# Patient Record
Sex: Female | Born: 1950
Health system: Southern US, Community
[De-identification: ages and names within clinical notes are randomized; demographics above are authoritative.]

## PROBLEM LIST (undated history)

## (undated) DIAGNOSIS — I10 Essential (primary) hypertension: Secondary | ICD-10-CM

## (undated) DIAGNOSIS — E669 Obesity, unspecified: Secondary | ICD-10-CM

## (undated) DIAGNOSIS — E785 Hyperlipidemia, unspecified: Secondary | ICD-10-CM

## (undated) DIAGNOSIS — C44311 Basal cell carcinoma of skin of nose: Secondary | ICD-10-CM

## (undated) HISTORY — PX: TOTAL ABDOMINAL HYSTERECTOMY W/ BILATERAL SALPINGOOPHORECTOMY: SHX83

## (undated) HISTORY — DX: Essential (primary) hypertension: I10

## (undated) HISTORY — PX: ABDOMINAL HYSTERECTOMY: SHX81

## (undated) HISTORY — DX: Hyperlipidemia, unspecified: E78.5

## (undated) HISTORY — PX: ORIF TIBIA FRACTURE: SHX5416

## (undated) HISTORY — DX: Basal cell carcinoma of skin of nose: C44.311

## (undated) HISTORY — DX: Obesity, unspecified: E66.9

---
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
## 2000-09-02 ENCOUNTER — Other Ambulatory Visit: Admission: RE | Admit: 2000-09-02 | Discharge: 2000-09-02 | Payer: Self-pay | Admitting: Obstetrics and Gynecology

## 2001-11-14 ENCOUNTER — Other Ambulatory Visit: Admission: RE | Admit: 2001-11-14 | Discharge: 2001-11-14 | Payer: Self-pay | Admitting: Obstetrics and Gynecology

## 2003-02-22 ENCOUNTER — Ambulatory Visit (HOSPITAL_COMMUNITY): Admission: RE | Admit: 2003-02-22 | Discharge: 2003-02-22 | Payer: Self-pay | Admitting: Gastroenterology

## 2003-02-22 ENCOUNTER — Encounter (INDEPENDENT_AMBULATORY_CARE_PROVIDER_SITE_OTHER): Payer: Self-pay | Admitting: *Deleted

## 2003-04-06 ENCOUNTER — Encounter: Payer: Self-pay | Admitting: Family Medicine

## 2003-04-06 ENCOUNTER — Encounter: Admission: RE | Admit: 2003-04-06 | Discharge: 2003-04-06 | Payer: Self-pay | Admitting: Family Medicine

## 2006-04-04 ENCOUNTER — Emergency Department (HOSPITAL_COMMUNITY): Admission: EM | Admit: 2006-04-04 | Discharge: 2006-04-04 | Payer: Self-pay | Admitting: Emergency Medicine

## 2006-06-12 ENCOUNTER — Ambulatory Visit: Payer: Self-pay | Admitting: Family Medicine

## 2006-07-02 ENCOUNTER — Ambulatory Visit: Payer: Self-pay | Admitting: Family Medicine

## 2006-10-03 ENCOUNTER — Ambulatory Visit: Payer: Self-pay | Admitting: Family Medicine

## 2007-02-03 ENCOUNTER — Ambulatory Visit: Payer: Self-pay | Admitting: Family Medicine

## 2007-06-25 ENCOUNTER — Ambulatory Visit: Payer: Self-pay | Admitting: Family Medicine

## 2007-10-27 ENCOUNTER — Ambulatory Visit: Payer: Self-pay | Admitting: Family Medicine

## 2008-03-10 ENCOUNTER — Ambulatory Visit: Payer: Self-pay | Admitting: Family Medicine

## 2008-05-17 ENCOUNTER — Ambulatory Visit: Payer: Self-pay | Admitting: Family Medicine

## 2008-07-05 ENCOUNTER — Ambulatory Visit: Payer: Self-pay | Admitting: Family Medicine

## 2008-10-25 ENCOUNTER — Ambulatory Visit: Payer: Self-pay | Admitting: Family Medicine

## 2009-02-22 ENCOUNTER — Ambulatory Visit: Payer: Self-pay | Admitting: Family Medicine

## 2009-06-30 ENCOUNTER — Ambulatory Visit: Payer: Self-pay | Admitting: Family Medicine

## 2009-10-28 ENCOUNTER — Ambulatory Visit: Payer: Self-pay | Admitting: Family Medicine

## 2009-12-05 ENCOUNTER — Ambulatory Visit: Payer: Self-pay | Admitting: Family Medicine

## 2010-01-09 ENCOUNTER — Ambulatory Visit: Payer: Self-pay | Admitting: Family Medicine

## 2010-02-24 ENCOUNTER — Ambulatory Visit: Payer: Self-pay | Admitting: Family Medicine

## 2010-06-28 ENCOUNTER — Ambulatory Visit: Payer: Self-pay | Admitting: Family Medicine

## 2010-10-26 ENCOUNTER — Ambulatory Visit
Admission: RE | Admit: 2010-10-26 | Discharge: 2010-10-26 | Payer: Self-pay | Source: Home / Self Care | Attending: Family Medicine | Admitting: Family Medicine

## 2011-02-20 HISTORY — PX: OTHER SURGICAL HISTORY: SHX169

## 2011-02-22 ENCOUNTER — Ambulatory Visit (INDEPENDENT_AMBULATORY_CARE_PROVIDER_SITE_OTHER): Payer: 59 | Admitting: Family Medicine

## 2011-02-22 DIAGNOSIS — I10 Essential (primary) hypertension: Secondary | ICD-10-CM

## 2011-02-22 DIAGNOSIS — E119 Type 2 diabetes mellitus without complications: Secondary | ICD-10-CM

## 2011-02-22 DIAGNOSIS — E785 Hyperlipidemia, unspecified: Secondary | ICD-10-CM

## 2011-02-22 DIAGNOSIS — Z79899 Other long term (current) drug therapy: Secondary | ICD-10-CM

## 2011-03-09 NOTE — Op Note (Signed)
NAMEJONE, PANEBIANCO                           ACCOUNT NO.:  [id number]   MEDICAL RECORD NO.:  [id number]                   PATIENT TYPE:  AMB   LOCATION:  ENDO                                 FACILITY:  [facility]   PHYSICIAN:  Petra Kuba, M.D.                 DATE OF BIRTH:  30-Aug-1951   DATE OF PROCEDURE:  02/22/2003  DATE OF DISCHARGE:                                 OPERATIVE REPORT   PROCEDURE PERFORMED:  Colonoscopy.   ENDOSCOPIST:  Petra Kuba, M.D.   INDICATIONS FOR PROCEDURE:  Screening.  Consent was signed after the risks,  benefits, methods and options were thoroughly discussed in the office.   MEDICINES USED:  Demerol 60 mg, Versed 6 mg.   DESCRIPTION OF PROCEDURE:  Rectal inspection was pertinent for external  hemorrhoids.  Digital exam was negative.  A video colonoscope was inserted  and easily advanced to the mid transverse.  At that point there was some  looping.  With abdominal pressure we were able to advance to the cecum.  To  get a better look at the cecum, we went ahead and rolled her on her back.  The cecum was pertinent for the appendiceal orifice and the ileocecal valve.  There were three small polyps which were hot biopsies and two tiny polyps  which were cold biopsied.  These were also put in the first container.  We  did use a lower setting for the cautery.  There was also a small nodule in  the cecum, probably nothing and two cold biopsies were obtained and put in  the second container.  The scope was slowly withdrawn.  The prep was  adequate.  There was some liquid stool that required washing and suctioning.  On slow withdrawal through the colon, the ascending and transverse was  normal.  The proximal level of the splenic flexure, a sessile polyp was seen  and was hot biopsied times three on the higher setting and put in the third  container.  The scope was further withdrawn.  There was a rare distal left-  sided diverticula but no other  abnormalities.  Once back in the rectum, the  scope was retroflexed, pertinent for some internal hemorrhoids.  Scope was  straightened, air was suctioned, scope removed.  The patient tolerated the  procedure well.  There was no immediate obvious complication.   ENDOSCOPIC DIAGNOSIS:  1. Internal and external hemorrhoids.  2. Rare sigmoid diverticula.  3. Splenic flexure with a questionable sessile polyp hot biopsied times     three.  4. Tiny small cecal polyps hot biopsied and two cold biopsies, put in the     first container.  5. Questionable cecal nodule, doubt significant.  Cold biopsy put in the     second container.  6. Otherwise within normal limits to the cecum.    PLAN:  Await pathology to determine future  colonic screening.  Happy to see  back p.r.n.  Otherwise return care to see Dr. Andrey Campanile and Dr. Ambrose Mantle for  customary health care maintenance to include yearly rectals and guaiacs.                                               Petra Kuba, M.D.    MEM/MEDQ  D:  02/22/2003  T:  02/22/2003  Job:  [id number]   cc:   Vale Haven. Andrey Campanile, M.D.  [address]  [place]  [place] [address]  Fax: [phone]   Malachi Pro. Ambrose Mantle, M.D.  [address]  [place]  [place] [address]  Fax: [phone]

## 2011-03-17 ENCOUNTER — Emergency Department (INDEPENDENT_AMBULATORY_CARE_PROVIDER_SITE_OTHER): Payer: Worker's Compensation

## 2011-03-17 ENCOUNTER — Emergency Department (HOSPITAL_BASED_OUTPATIENT_CLINIC_OR_DEPARTMENT_OTHER)
Admission: EM | Admit: 2011-03-17 | Discharge: 2011-03-17 | Disposition: A | Discharge status: Home / Self Care | Payer: Worker's Compensation | Attending: Emergency Medicine | Admitting: Emergency Medicine

## 2011-03-17 DIAGNOSIS — S82009A Unspecified fracture of unspecified patella, initial encounter for closed fracture: Secondary | ICD-10-CM | POA: Insufficient documentation

## 2011-03-17 DIAGNOSIS — M79609 Pain in unspecified limb: Secondary | ICD-10-CM

## 2011-03-17 DIAGNOSIS — E781 Pure hyperglyceridemia: Secondary | ICD-10-CM | POA: Insufficient documentation

## 2011-03-17 DIAGNOSIS — W010XXA Fall on same level from slipping, tripping and stumbling without subsequent striking against object, initial encounter: Secondary | ICD-10-CM | POA: Insufficient documentation

## 2011-03-17 DIAGNOSIS — E119 Type 2 diabetes mellitus without complications: Secondary | ICD-10-CM | POA: Insufficient documentation

## 2011-03-17 DIAGNOSIS — I10 Essential (primary) hypertension: Secondary | ICD-10-CM | POA: Insufficient documentation

## 2011-03-20 ENCOUNTER — Telehealth: Payer: Self-pay | Admitting: Family Medicine

## 2011-03-20 NOTE — Telephone Encounter (Signed)
Pt fell at work Saturday 03/17/11 and broke her knee cap.  She is being referred by ER to Dr. Durene Romans of [facility].  Pt just wanted you to be aware.

## 2011-03-22 ENCOUNTER — Other Ambulatory Visit: Payer: Self-pay | Admitting: Specialist

## 2011-03-22 ENCOUNTER — Encounter (HOSPITAL_COMMUNITY): Payer: Worker's Compensation

## 2011-03-22 ENCOUNTER — Other Ambulatory Visit (HOSPITAL_COMMUNITY): Payer: Self-pay | Admitting: Specialist

## 2011-03-22 ENCOUNTER — Telehealth: Payer: Self-pay | Admitting: Family Medicine

## 2011-03-22 ENCOUNTER — Ambulatory Visit (HOSPITAL_COMMUNITY)
Admission: RE | Admit: 2011-03-22 | Discharge: 2011-03-22 | Disposition: A | Discharge status: Home / Self Care | Payer: Worker's Compensation | Source: Ambulatory Visit | Attending: Specialist | Admitting: Specialist

## 2011-03-22 DIAGNOSIS — Z01811 Encounter for preprocedural respiratory examination: Secondary | ICD-10-CM

## 2011-03-22 DIAGNOSIS — I10 Essential (primary) hypertension: Secondary | ICD-10-CM

## 2011-03-22 DIAGNOSIS — I498 Other specified cardiac arrhythmias: Secondary | ICD-10-CM | POA: Insufficient documentation

## 2011-03-22 DIAGNOSIS — Z01810 Encounter for preprocedural cardiovascular examination: Secondary | ICD-10-CM | POA: Insufficient documentation

## 2011-03-22 DIAGNOSIS — Z01818 Encounter for other preprocedural examination: Secondary | ICD-10-CM | POA: Insufficient documentation

## 2011-03-22 DIAGNOSIS — Z01812 Encounter for preprocedural laboratory examination: Secondary | ICD-10-CM | POA: Insufficient documentation

## 2011-03-22 LAB — CROSSMATCH: Antibody Screen: NEGATIVE

## 2011-03-22 LAB — APTT: aPTT: 28 seconds (ref 24–37)

## 2011-03-22 LAB — CBC
HCT: 35.7 % — ABNORMAL LOW (ref 36.0–46.0)
MCV: 93.9 fL (ref 78.0–100.0)
RDW: 13.1 % (ref 11.5–15.5)
WBC: 4.7 10*3/uL (ref 4.0–10.5)

## 2011-03-22 LAB — COMPREHENSIVE METABOLIC PANEL
Albumin: 3.5 g/dL (ref 3.5–5.2)
BUN: 20 mg/dL (ref 6–23)
Creatinine, Ser: 0.62 mg/dL (ref 0.4–1.2)
GFR calc Af Amer: >60 mL/min (ref >60)
Potassium: 4.2 mEq/L (ref 3.5–5.1)
Total Protein: 6.5 g/dL (ref 6.0–8.3)

## 2011-03-22 LAB — DIFFERENTIAL
Eosinophils Relative: 3 % (ref 0–5)
Lymphocytes Relative: 19 % (ref 12–46)
Lymphs Abs: 0.9 10*3/uL (ref 0.7–4.0)
Monocytes Absolute: 0.3 10*3/uL (ref 0.1–1.0)
Monocytes Relative: 7 % (ref 3–12)

## 2011-03-22 LAB — URINALYSIS, ROUTINE W REFLEX MICROSCOPIC
Bilirubin Urine: NEGATIVE
Glucose, UA: NEGATIVE mg/dL
Hgb urine dipstick: NEGATIVE
Ketones, ur: NEGATIVE mg/dL
pH: 5.5 (ref 5.0–8.0)

## 2011-03-22 LAB — PROTIME-INR: INR: 1.01 (ref 0.00–1.49)

## 2011-03-22 LAB — SURGICAL PCR SCREEN: MRSA, PCR: NEGATIVE

## 2011-03-23 ENCOUNTER — Ambulatory Visit (HOSPITAL_COMMUNITY)
Admission: RE | Admit: 2011-03-23 | Discharge: 2011-03-25 | Disposition: A | Discharge status: Home / Self Care | Payer: Worker's Compensation | Source: Ambulatory Visit | Attending: Specialist | Admitting: Specialist

## 2011-03-23 DIAGNOSIS — Z79899 Other long term (current) drug therapy: Secondary | ICD-10-CM | POA: Insufficient documentation

## 2011-03-23 DIAGNOSIS — I10 Essential (primary) hypertension: Secondary | ICD-10-CM | POA: Insufficient documentation

## 2011-03-23 DIAGNOSIS — Z7982 Long term (current) use of aspirin: Secondary | ICD-10-CM | POA: Insufficient documentation

## 2011-03-23 DIAGNOSIS — J45909 Unspecified asthma, uncomplicated: Secondary | ICD-10-CM | POA: Insufficient documentation

## 2011-03-23 DIAGNOSIS — X58XXXA Exposure to other specified factors, initial encounter: Secondary | ICD-10-CM | POA: Insufficient documentation

## 2011-03-23 DIAGNOSIS — S82009A Unspecified fracture of unspecified patella, initial encounter for closed fracture: Secondary | ICD-10-CM | POA: Insufficient documentation

## 2011-03-23 DIAGNOSIS — E119 Type 2 diabetes mellitus without complications: Secondary | ICD-10-CM | POA: Insufficient documentation

## 2011-03-23 LAB — GLUCOSE, CAPILLARY: Glucose-Capillary: 115 mg/dL — ABNORMAL HIGH (ref 70–99)

## 2011-03-24 LAB — GLUCOSE, CAPILLARY: Glucose-Capillary: 181 mg/dL — ABNORMAL HIGH (ref 70–99)

## 2011-03-25 LAB — GLUCOSE, CAPILLARY
Glucose-Capillary: 123 mg/dL — ABNORMAL HIGH (ref 70–99)
Glucose-Capillary: 173 mg/dL — ABNORMAL HIGH (ref 70–99)
Glucose-Capillary: 149 mg/dL — ABNORMAL HIGH (ref 70–99)

## 2011-03-30 ENCOUNTER — Emergency Department (HOSPITAL_COMMUNITY): Payer: 59

## 2011-03-30 ENCOUNTER — Emergency Department (HOSPITAL_COMMUNITY)
Admission: EM | Admit: 2011-03-30 | Discharge: 2011-03-30 | Disposition: A | Discharge status: Home / Self Care | Payer: 59 | Attending: Emergency Medicine | Admitting: Emergency Medicine

## 2011-03-30 DIAGNOSIS — I10 Essential (primary) hypertension: Secondary | ICD-10-CM | POA: Insufficient documentation

## 2011-03-30 DIAGNOSIS — R109 Unspecified abdominal pain: Secondary | ICD-10-CM | POA: Insufficient documentation

## 2011-03-30 DIAGNOSIS — Z9889 Other specified postprocedural states: Secondary | ICD-10-CM | POA: Insufficient documentation

## 2011-03-30 DIAGNOSIS — E119 Type 2 diabetes mellitus without complications: Secondary | ICD-10-CM | POA: Insufficient documentation

## 2011-03-30 DIAGNOSIS — K5900 Constipation, unspecified: Secondary | ICD-10-CM | POA: Insufficient documentation

## 2011-03-30 DIAGNOSIS — R112 Nausea with vomiting, unspecified: Secondary | ICD-10-CM | POA: Insufficient documentation

## 2011-03-30 LAB — CBC
Hemoglobin: 11.7 g/dL — ABNORMAL LOW (ref 12.0–15.0)
MCH: 30.2 pg (ref 26.0–34.0)
MCHC: 33.3 g/dL (ref 30.0–36.0)
Platelets: 272 10*3/uL (ref 150–400)
RDW: 12.8 % (ref 11.5–15.5)

## 2011-03-30 LAB — COMPREHENSIVE METABOLIC PANEL
AST: 15 U/L (ref 0–37)
CO2: 28 mEq/L (ref 19–32)
Calcium: 10.4 mg/dL (ref 8.4–10.5)
Creatinine, Ser: 0.61 mg/dL (ref 0.4–1.2)
GFR calc Af Amer: >60 mL/min (ref >60)
GFR calc non Af Amer: >60 mL/min (ref >60)
Total Protein: 6.9 g/dL (ref 6.0–8.3)

## 2011-03-30 LAB — URINALYSIS, ROUTINE W REFLEX MICROSCOPIC
Hgb urine dipstick: NEGATIVE
Nitrite: NEGATIVE
Specific Gravity, Urine: 1.018 (ref 1.005–1.030)
Urobilinogen, UA: 0.2 mg/dL (ref 0.0–1.0)
pH: 7 (ref 5.0–8.0)

## 2011-03-30 LAB — DIFFERENTIAL
Basophils Relative: 0 % (ref 0–1)
Eosinophils Absolute: 0 10*3/uL (ref 0.0–0.7)
Eosinophils Relative: 0 % (ref 0–5)
Monocytes Absolute: 0.4 10*3/uL (ref 0.1–1.0)
Monocytes Relative: 5 % (ref 3–12)

## 2011-03-30 LAB — URINE MICROSCOPIC-ADD ON

## 2011-03-31 LAB — URINE CULTURE: Colony Count: 6000

## 2011-04-03 NOTE — Telephone Encounter (Signed)
OK 

## 2011-04-07 ENCOUNTER — Emergency Department (HOSPITAL_COMMUNITY): Payer: Worker's Compensation

## 2011-04-07 ENCOUNTER — Inpatient Hospital Stay (INDEPENDENT_AMBULATORY_CARE_PROVIDER_SITE_OTHER)
Admission: RE | Admit: 2011-04-07 | Discharge: 2011-04-07 | Disposition: A | Discharge status: Home / Self Care | Payer: 59 | Source: Ambulatory Visit | Attending: Family Medicine | Admitting: Family Medicine

## 2011-04-07 ENCOUNTER — Inpatient Hospital Stay (HOSPITAL_COMMUNITY)
Admission: EM | Admit: 2011-04-07 | Discharge: 2011-04-11 | DRG: 390 | Disposition: A | Discharge status: Home / Self Care | Payer: Worker's Compensation | Attending: Surgery | Admitting: Surgery

## 2011-04-07 DIAGNOSIS — T4275XA Adverse effect of unspecified antiepileptic and sedative-hypnotic drugs, initial encounter: Secondary | ICD-10-CM | POA: Diagnosis present

## 2011-04-07 DIAGNOSIS — R109 Unspecified abdominal pain: Secondary | ICD-10-CM

## 2011-04-07 DIAGNOSIS — Y92009 Unspecified place in unspecified non-institutional (private) residence as the place of occurrence of the external cause: Secondary | ICD-10-CM

## 2011-04-07 DIAGNOSIS — K56609 Unspecified intestinal obstruction, unspecified as to partial versus complete obstruction: Secondary | ICD-10-CM

## 2011-04-07 DIAGNOSIS — E119 Type 2 diabetes mellitus without complications: Secondary | ICD-10-CM | POA: Diagnosis present

## 2011-04-07 DIAGNOSIS — K560 Paralytic ileus: Principal | ICD-10-CM | POA: Diagnosis present

## 2011-04-07 DIAGNOSIS — I10 Essential (primary) hypertension: Secondary | ICD-10-CM | POA: Diagnosis present

## 2011-04-07 DIAGNOSIS — K5900 Constipation, unspecified: Secondary | ICD-10-CM | POA: Diagnosis present

## 2011-04-08 LAB — DIFFERENTIAL
Lymphocytes Relative: 14 % (ref 12–46)
Monocytes Absolute: 0.7 10*3/uL (ref 0.1–1.0)
Monocytes Relative: 10 % (ref 3–12)
Neutro Abs: 5.2 10*3/uL (ref 1.7–7.7)

## 2011-04-08 LAB — CBC
HCT: 37.4 % (ref 36.0–46.0)
Hemoglobin: 12.9 g/dL (ref 12.0–15.0)
MCH: 31 pg (ref 26.0–34.0)
MCHC: 34.5 g/dL (ref 30.0–36.0)

## 2011-04-08 LAB — GLUCOSE, CAPILLARY
Glucose-Capillary: 128 mg/dL — ABNORMAL HIGH (ref 70–99)
Glucose-Capillary: 158 mg/dL — ABNORMAL HIGH (ref 70–99)
Glucose-Capillary: 167 mg/dL — ABNORMAL HIGH (ref 70–99)

## 2011-04-08 LAB — BASIC METABOLIC PANEL
BUN: 19 mg/dL (ref 6–23)
Calcium: 9.8 mg/dL (ref 8.4–10.5)
GFR calc non Af Amer: >60 mL/min (ref >60)
Glucose, Bld: 143 mg/dL — ABNORMAL HIGH (ref 70–99)

## 2011-04-08 LAB — URINALYSIS, ROUTINE W REFLEX MICROSCOPIC
Bilirubin Urine: NEGATIVE
Hgb urine dipstick: NEGATIVE
Protein, ur: NEGATIVE mg/dL
Specific Gravity, Urine: 1.029 (ref 1.005–1.030)
Urobilinogen, UA: 1 mg/dL (ref 0.0–1.0)

## 2011-04-08 LAB — URINE MICROSCOPIC-ADD ON

## 2011-04-08 MED ORDER — IOHEXOL 300 MG/ML  SOLN
80.0000 mL | Freq: Once | INTRAMUSCULAR | Status: AC | PRN
  Administered 2011-04-08: 80 mL via INTRAVENOUS

## 2011-04-09 ENCOUNTER — Inpatient Hospital Stay (HOSPITAL_COMMUNITY): Payer: Worker's Compensation

## 2011-04-09 LAB — PHOSPHORUS: Phosphorus: 3.3 mg/dL (ref 2.3–4.6)

## 2011-04-09 LAB — GLUCOSE, CAPILLARY
Glucose-Capillary: 133 mg/dL — ABNORMAL HIGH (ref 70–99)
Glucose-Capillary: 149 mg/dL — ABNORMAL HIGH (ref 70–99)
Glucose-Capillary: 150 mg/dL — ABNORMAL HIGH (ref 70–99)

## 2011-04-09 LAB — BASIC METABOLIC PANEL
BUN: 9 mg/dL (ref 6–23)
CO2: 30 mEq/L (ref 19–32)
Calcium: 9.3 mg/dL (ref 8.4–10.5)
Creatinine, Ser: 0.51 mg/dL (ref 0.50–1.10)
Glucose, Bld: 119 mg/dL — ABNORMAL HIGH (ref 70–99)

## 2011-04-10 LAB — URINE CULTURE: Culture  Setup Time: 201206171205

## 2011-04-10 LAB — GLUCOSE, CAPILLARY: Glucose-Capillary: 90 mg/dL (ref 70–99)

## 2011-04-11 LAB — GLUCOSE, CAPILLARY
Glucose-Capillary: 102 mg/dL — ABNORMAL HIGH (ref 70–99)
Glucose-Capillary: 104 mg/dL — ABNORMAL HIGH (ref 70–99)

## 2011-04-12 LAB — GLUCOSE, CAPILLARY: Glucose-Capillary: 137 mg/dL — ABNORMAL HIGH (ref 70–99)

## 2011-04-12 NOTE — Discharge Summary (Signed)
[name], [name] NO.:  [id number]  MEDICAL RECORD NO.:  [id number]  LOCATION:  5501                         FACILITY:  [facility]  PHYSICIAN:  Velora Heckler, MD      DATE OF BIRTH:  [birth year]  DATE OF ADMISSION:  04/07/2011 DATE OF DISCHARGE:  04/11/2011                              DISCHARGE SUMMARY   ADMITTING PHYSICIAN:  Abigail Miyamoto, MD  DATE OF DISCHARGE:  April 11, 2011  DISCHARGING PHYSICIAN:  Velora Heckler, MD  ORTHOPEDIC SURGEON:  Erasmo Leventhal, MD  CONSULTANTS:  None.  PROCEDURES:  None.  REASON FOR ADMISSION:  [name] is a 60 year old female who is status post ORIF of a right patellar fracture by Dr. Thomasena Edis on March 23, 2011. Since that surgery, she has had some intermittent crampy abdominal pain. Over the last 24 hours prior to admission, she developed nausea and vomiting along with worsening crampy abdominal pain.  She presented to the emergency department for further evaluation.  Upon arrival, she had a CT scan that revealed dilated small bowel with air-fluid levels, could not rule out a small bowel obstruction.  At this time, we are asked to evaluate the patient for surgical admission.  Please see admitting history and physical for further details.  ADMITTING DIAGNOSES: 1. Suspected ileus given recent surgery but rule out small bowel     obstruction. 2. Diabetes mellitus. 3. Hypertension.  HOSPITAL COURSE:  At this time, the patient was admitted.  An NG tube was placed in the emergency department.  She was initially given bowel rest.  The patient is still better on hospital day #1 after having her NG tube placed.  She was not passing flatus at this time and therefore her NG tube was continued.  She did appear to have some constipation and therefore she was given several enemas.  The patient did have good results with these enemas and the following day she was having more active bowel sounds as well as flatus.  At this  time, her NG tube was clamped and placed on clamping trials.  By hospital day #3, the patient was passing flatus and did not have any nausea or vomiting with clamping of her NG tube.  Therefore her NG tube was discontinued and her diet was advanced as tolerated.  By hospital day #4, the patient was tolerating regular diet and having normal bowel movement.  Her abdomen was soft, nontender, nondistended with active bowel sounds.  At this time, the patient was felt stable for discharge home.  DISCHARGE DIAGNOSES: 1. Likely ileus with constipation secondary to narcotic use. 2. Hypertension. 3. Diabetes mellitus.  DISCHARGE MEDICATIONS:  Please see medication reconciliation form.  The patient may resume all normal home medicines.  She is encouraged to take MiraLax over-the-counter as needed for constipation.  DISCHARGE INSTRUCTIONS:  The patient has no activity restrictions from a General Surgery standpoint, however, she is to maintain her restrictions from an orthopedic standpoint.  She has no dietary restrictions.  She does not need to follow up with [place] except on a p.r.n. basis.  She does, however, need to follow up with Dr. Thomasena Edis as already scheduled.  Letha Cape, PA   ______________________________ Velora Heckler, MD    KEO/MEDQ  D:  04/11/2011  T:  04/11/2011  Job:  [id number]  cc:   [facility] Erasmo Leventhal, M.D.  Electronically Signed by Barnetta Chapel PA on 04/12/2011 01:26:36 PM Electronically Signed by Darnell Level MD on 04/12/2011 03:30:38 PM

## 2011-04-18 NOTE — H&P (Signed)
[name], [name] NO.:  [id number]  MEDICAL RECORD NO.:  [id number]  LOCATION:  [facility]                         FACILITY:  [facility]  PHYSICIAN:  Abigail Miyamoto, M.D. DATE OF BIRTH:  02-13-1951  DATE OF ADMISSION:  04/07/2011 DATE OF DISCHARGE:                             HISTORY & PHYSICAL   CHIEF COMPLAINT:  Abdominal pain, nausea, and vomiting.  HISTORY:  This is a 60 year old female who is status post ORIF of a right patella fracture by Dr. Thomasena Edis at [facility] on March 23, 2011. Postoperatively, she reported she has been having intermittent cramping abdominal pain.  She has not really been constipated since surgery except for last 2 days.  Over the last 24 hours, she developed nausea and vomiting along with worsening of crampy pain.  She denies dysuria. Since presenting to the emergency room, she has a nasogastric tube placed which has made her feel better.  PAST MEDICAL HISTORY: 1. Type 2 diabetes. 2. Hypertension.  PAST SURGICAL HISTORY:  ORIF of the patella, hysterectomy, and bilateral tubal ligation.  FAMILY HISTORY:  Noncontributory.  SOCIAL HISTORY:  She does not smoke and does not drink alcohol.  REVIEW OF SYSTEMS:  GENERAL:  Negative for fever or chills.  PULMONARY: Negative for cough, shortness of breath, or difficulty breathing. CARDIAC:  Negative for chest pain or irregular heartbeat.  ABDOMEN:  As listed above.  There is no hematemesis.  There is no blood in stool. URINARY:  Negative for dysuria or hematuria.  MEDICATIONS:  Please see universal medical reconciliation form.  ALLERGIES:  No known drug allergies.  PHYSICAL EXAMINATION:  GENERAL:  This is a well-developed, well- nourished female in no acute stress. VITAL SIGNS:  She is afebrile.  Vital signs stable. EYES:  Anicteric.  Pupils are reactive bilaterally. ENT:  External ears and nose are normal.  Hearing is normal.  Oropharynx is clear. NECK:  Supple.  Trachea is midline.   There is no thyromegaly. LUNGS:  Clear to auscultation bilaterally with normal respiratory effort. CARDIOVASCULAR:  Regular rate and rhythm.  There are no murmurs.  There is no peripheral edema. ABDOMEN:  Soft, is really nondistended.  There is minimal to no tenderness with no guarding.  There are hypoactive bowel sounds.  There are no hernias. EXTREMITIES:  Warm and well perfused.  No edema, clubbing, or cyanosis. Peripheral ulcers are intact to all 4 extremities.  She has a brace on her right leg from her recent surgery. SKIN:  No rash and no jaundice. NEUROLOGICAL:  She is awake, alert, and oriented. PSYCHIATRIC:  Judgment and affect are normal.  LABORATORY DATA:  White blood count 6.19.  Potassium 5.3, creatinine 0.53, and glucose 143.  The patient has a CAT scan of the abdomen and pelvis that shows dilated small bowel with air-fluid levels, but could not rule out a small bowel obstruction.  IMPRESSION:  This is a patient with a suspected ileus given her recent surgery.  She will be admitted for rule out small bowel obstruction.  PLAN:  Plan will be to admit, continue her nasogastric suctioning and bowel rest and IV rehydration.  We will repeat her abdominal x-rays in 24 hours.  Hopefully, she will improve without need for intervention.     Abigail Miyamoto, M.D.     DB/MEDQ  D:  04/08/2011  T:  04/08/2011  Job:  [id number]  cc:   Dr. Valma Cava  Electronically Signed by Abigail Miyamoto M.D. on 04/18/2011 04:31:06 PM

## 2011-05-09 NOTE — Op Note (Signed)
[name], [name]               ACCOUNT NO.:  [id number]  MEDICAL RECORD NO.:  [id number]           PATIENT TYPE:  O  LOCATION:  DAYL                         FACILITY:  [facility]  PHYSICIAN:  Erasmo Leventhal, M.D.DATE OF BIRTH:  1951-08-05  DATE OF PROCEDURE:  03/23/2011 DATE OF DISCHARGE:                              OPERATIVE REPORT   PREOPERATIVE DIAGNOSIS:  Right knee comminuted patellar fracture.  POSTOPERATIVE DIAGNOSIS:  Right knee comminuted patellar fracture.  PROCEDURES: 1. Open reduction and internal fixation of the comminuted patellar     fracture. 2. C-arm radiography.  SURGEON:  Erasmo Leventhal, M.D.  ASSISTANT:  Jamelle Rushing, P.A.  ANESTHESIA:  Femoral nerve block with general.  ESTIMATED BLOOD LOSS:  Less than 50 mL.  DRAINS:  None.  COMPLICATIONS:  None.  TOURNIQUET TIME:  58 minutes on 300 mmHg.  DISPOSITION:  PACU stable.  OPERATIVE DETAIL:  The patient was counseled in the holding area, correct site was identified.  IV was started.  Correct site was identified.  Taken to the operating room, placed in position.  General anesthesia.  IV antibiotics were given. A time-out was done.  Right lower extremity was elevated.  Prepped with DuraPrep and draped in sterile fashion.  She was exsanguinated with Esmarch and tourniquet was inflated to 300 mmHg.  Straight midline incision was made between skin and subcutaneous tissue.  Soft tissue flaps developed.  Air trauma was encountered.  At this point in time, a subperiosteal dissection was taken down to expose the superior surface of the patella.  There were approximately 5 pieces at this time of a small comminuted fragment. Fracture was opened, the joint was copiously irrigated.  Hematoma was removed.  The fracture reduced as anatomically as possible, held with tenaculum and clamp.  I will then palpate the undersurface of patella. Could feel that the articular surface was nice and  smooth.  Two 0.062 C wires were placed in a retrograde fashion.  C-arm was utilized, revealed excellent reduction of the fracture and placement of the pins.  At this point in time utilizing tension band technique, the wires were placed proximal and distally in a figure-of-eight fashion, tightened down nicely.  C-arm picture confirmed excellent placement of the implants and reduction of fracture.  Also note that tensor had been cut appropriately.  Knee is put through general range of motion 0 to 30 degrees.  Fracture remained stable.  Wounds were copiously irrigated again.  The periosteum of the extensor mechanism was meticulously closed on top of this with a FiberWire suture in a figure-of-eight fashion, retinaculum with Vicryl, subcu with Vicryl, skin with subcu Monocryl suture.  Steri-Strips were applied.  Sterile compressive dressing was applied.  Tourniquet was deflated.  She was placed in a knee immobilizer, custom-molded in full extension, properly padded.  Sponge and needle count correct.  No complications or problems.  She was taken from the operating room to the PACU in stable condition.  To help with surgery technique and decision making, Jamelle Rushing, P.A.'s assistance was needed throughout the entire case.  ______________________________ Erasmo Leventhal, M.D.     RAC/MEDQ  D:  03/23/2011  T:  03/23/2011  Job:  [id number]  Electronically Signed by Eugenia Mcalpine M.D. on 05/09/2011 04:50:03 PM

## 2011-06-21 ENCOUNTER — Encounter: Payer: Self-pay | Admitting: Family Medicine

## 2011-06-27 ENCOUNTER — Ambulatory Visit: Payer: 59 | Admitting: Family Medicine

## 2011-06-27 ENCOUNTER — Encounter: Payer: Self-pay | Admitting: Family Medicine

## 2011-06-27 ENCOUNTER — Ambulatory Visit (INDEPENDENT_AMBULATORY_CARE_PROVIDER_SITE_OTHER): Payer: 59 | Admitting: Family Medicine

## 2011-06-27 DIAGNOSIS — E119 Type 2 diabetes mellitus without complications: Secondary | ICD-10-CM

## 2011-06-27 DIAGNOSIS — I152 Hypertension secondary to endocrine disorders: Secondary | ICD-10-CM

## 2011-06-27 DIAGNOSIS — E1169 Type 2 diabetes mellitus with other specified complication: Secondary | ICD-10-CM

## 2011-06-27 DIAGNOSIS — N390 Urinary tract infection, site not specified: Secondary | ICD-10-CM

## 2011-06-27 DIAGNOSIS — Z23 Encounter for immunization: Secondary | ICD-10-CM

## 2011-06-27 DIAGNOSIS — Z79899 Other long term (current) drug therapy: Secondary | ICD-10-CM

## 2011-06-27 DIAGNOSIS — R208 Other disturbances of skin sensation: Secondary | ICD-10-CM

## 2011-06-27 DIAGNOSIS — E785 Hyperlipidemia, unspecified: Secondary | ICD-10-CM

## 2011-06-27 DIAGNOSIS — E118 Type 2 diabetes mellitus with unspecified complications: Secondary | ICD-10-CM | POA: Insufficient documentation

## 2011-06-27 DIAGNOSIS — R209 Unspecified disturbances of skin sensation: Secondary | ICD-10-CM

## 2011-06-27 DIAGNOSIS — E1159 Type 2 diabetes mellitus with other circulatory complications: Secondary | ICD-10-CM | POA: Insufficient documentation

## 2011-06-27 DIAGNOSIS — E669 Obesity, unspecified: Secondary | ICD-10-CM

## 2011-06-27 DIAGNOSIS — I10 Essential (primary) hypertension: Secondary | ICD-10-CM

## 2011-06-27 LAB — POCT URINALYSIS DIPSTICK
Glucose, UA: NEGATIVE
Ketones, UA: NEGATIVE
Spec Grav, UA: 1.01

## 2011-06-27 LAB — LIPID PANEL
Cholesterol: 157 mg/dL (ref 0–200)
Total CHOL/HDL Ratio: 2.8 Ratio

## 2011-06-27 LAB — CBC WITH DIFFERENTIAL/PLATELET
Eosinophils Relative: 2 % (ref 0–5)
HCT: 36.8 % (ref 36.0–46.0)
Lymphocytes Relative: 28 % (ref 12–46)
Lymphs Abs: 1.2 10*3/uL (ref 0.7–4.0)
MCV: 94.1 fL (ref 78.0–100.0)
Monocytes Absolute: 0.3 10*3/uL (ref 0.1–1.0)
Neutro Abs: 2.6 10*3/uL (ref 1.7–7.7)
Platelets: 235 10*3/uL (ref 150–400)
RBC: 3.91 MIL/uL (ref 3.87–5.11)
WBC: 4.2 10*3/uL (ref 4.0–10.5)

## 2011-06-27 LAB — COMPREHENSIVE METABOLIC PANEL
ALT: 18 U/L (ref 0–35)
Albumin: 4.5 g/dL (ref 3.5–5.2)
CO2: 31 mEq/L (ref 19–32)
Calcium: 10.6 mg/dL — ABNORMAL HIGH (ref 8.4–10.5)
Chloride: 103 mEq/L (ref 96–112)
Creat: 0.61 mg/dL (ref 0.50–1.10)
Potassium: 4.4 mEq/L (ref 3.5–5.3)

## 2011-06-27 MED ORDER — SULFAMETHOXAZOLE-TMP DS 800-160 MG PO TABS: 1.0000 | ORAL_TABLET | Freq: Two times a day (BID) | ORAL | Status: AC

## 2011-06-27 NOTE — Patient Instructions (Addendum)
Continue on your present medications. Continue to work on strengthening her knee. Call me if her urinary symptoms don't go weight after the antibiotic

## 2011-06-27 NOTE — Progress Notes (Signed)
  Subjective:    Patient ID: [name], female    DOB: 1950-11-26, 60 y.o.   MRN: [id number]  HPI Is here for a recheck on her diabetes. She continues on medications listed in the chart. She does check her blood sugars regularly and the numbers run around 110. Her exercise his do to recent right knee surgery due to to patellar fracture. Surgery was June 1. Her last eye exam was October. She does check her feet regularly. She also complains of an itching sensation over the last week with urination.  Review of Systems     Objective:   Physical Exam Alert and in no distress. Hemoglobin A1c is 5.8. Urine microscopic did show white cells and bacteria       Assessment & Plan:  Diabetes. Hypertension. Hyperlipidemia. UTI. I will treat her with Septra for 3 days. She is to call me back to let me know how this works. Continue on her present medication regimen. Encouraged her to become as active as possible and rehabilitation the knee.

## 2011-06-28 ENCOUNTER — Ambulatory Visit: Payer: 59 | Admitting: Family Medicine

## 2011-06-29 ENCOUNTER — Telehealth: Payer: Self-pay

## 2011-06-29 NOTE — Telephone Encounter (Signed)
Called pt to inform labs and continue on present med

## 2011-09-27 IMAGING — CR DG CHEST 2V
2 series · 2 of 2 positions shown · non-contrast
Comparison: None.

CLINICAL DATA: Diabetes.  Hypertension.  Preoperative
cardiopulmonary evaluation.

CHEST - 2 VIEW

[w chest lat]
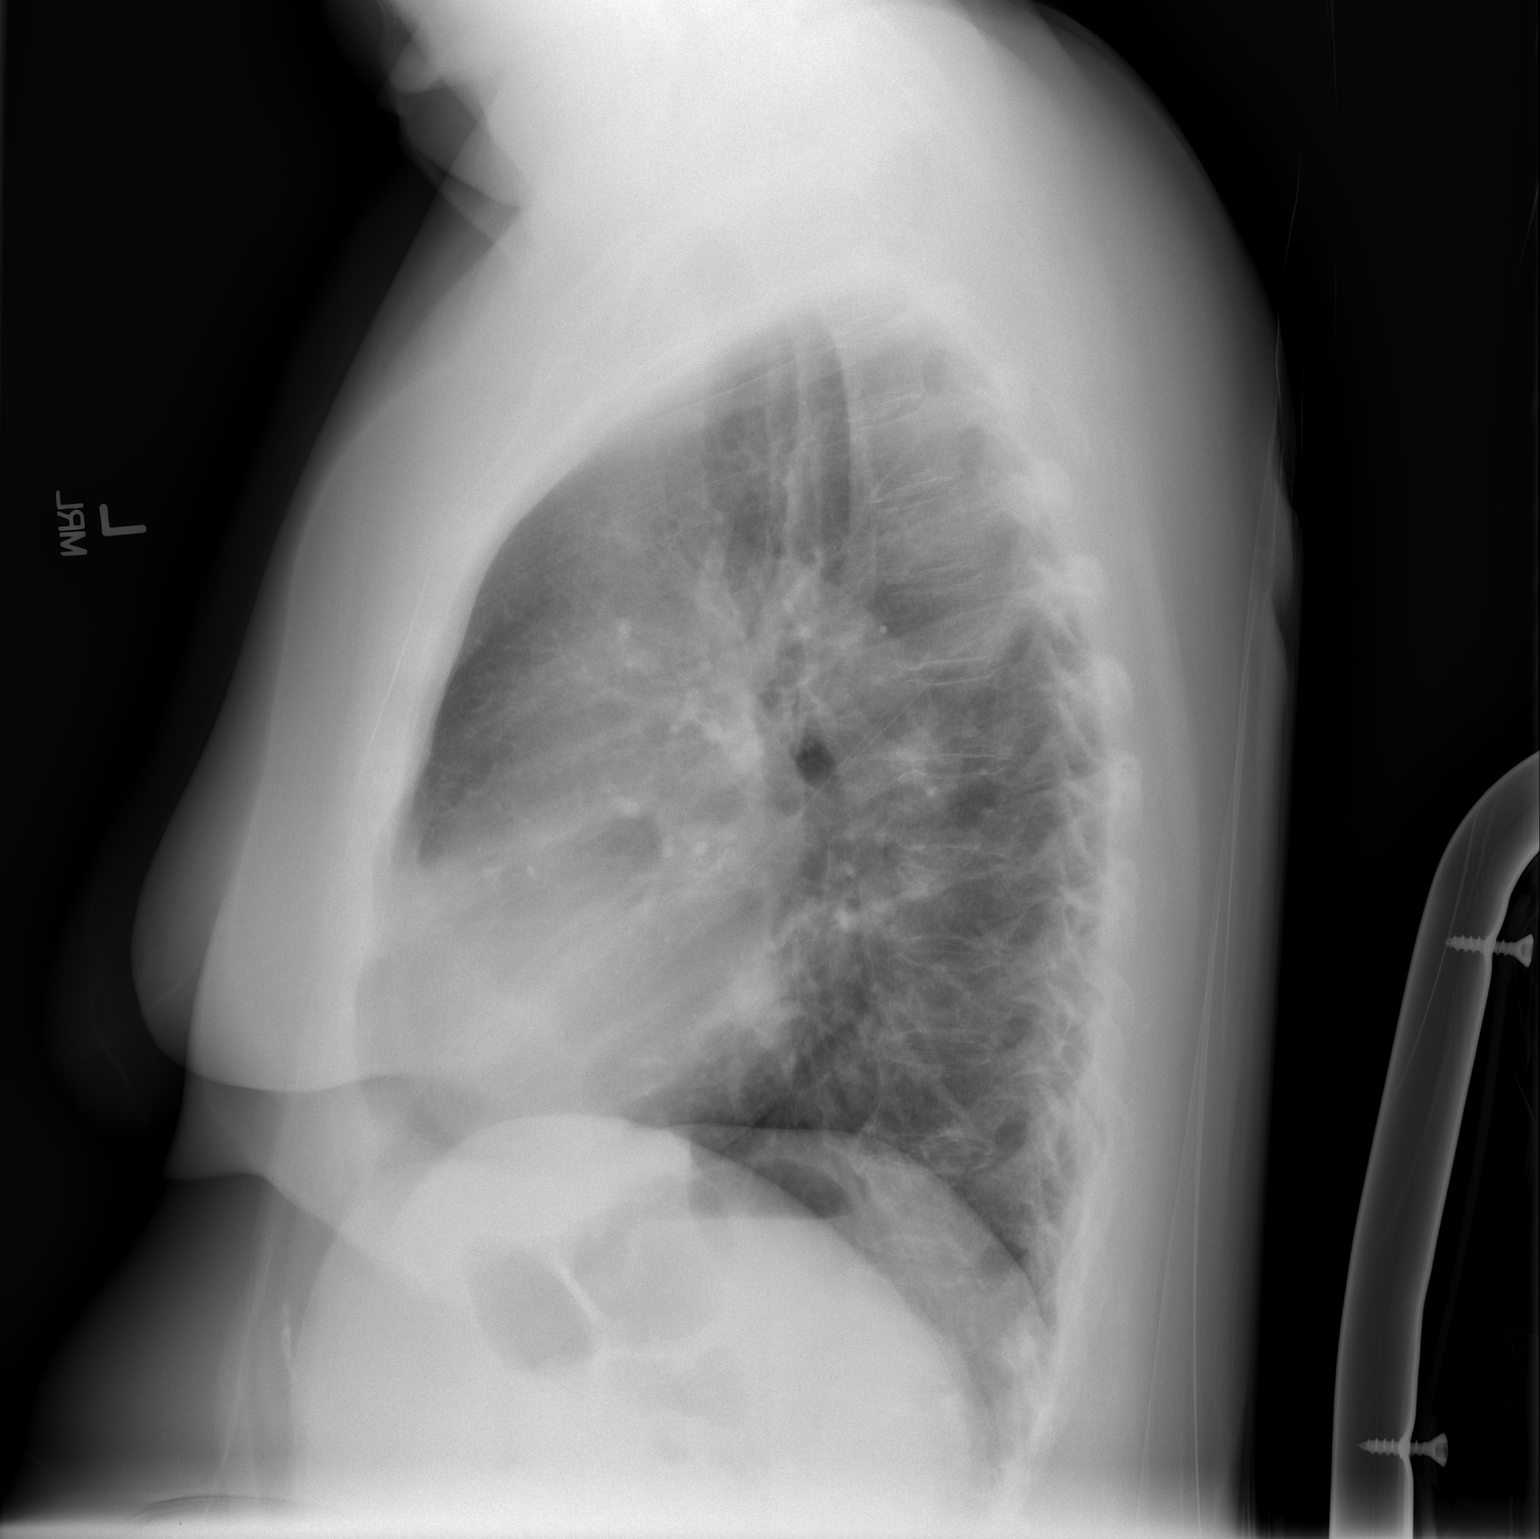

[view not recorded]
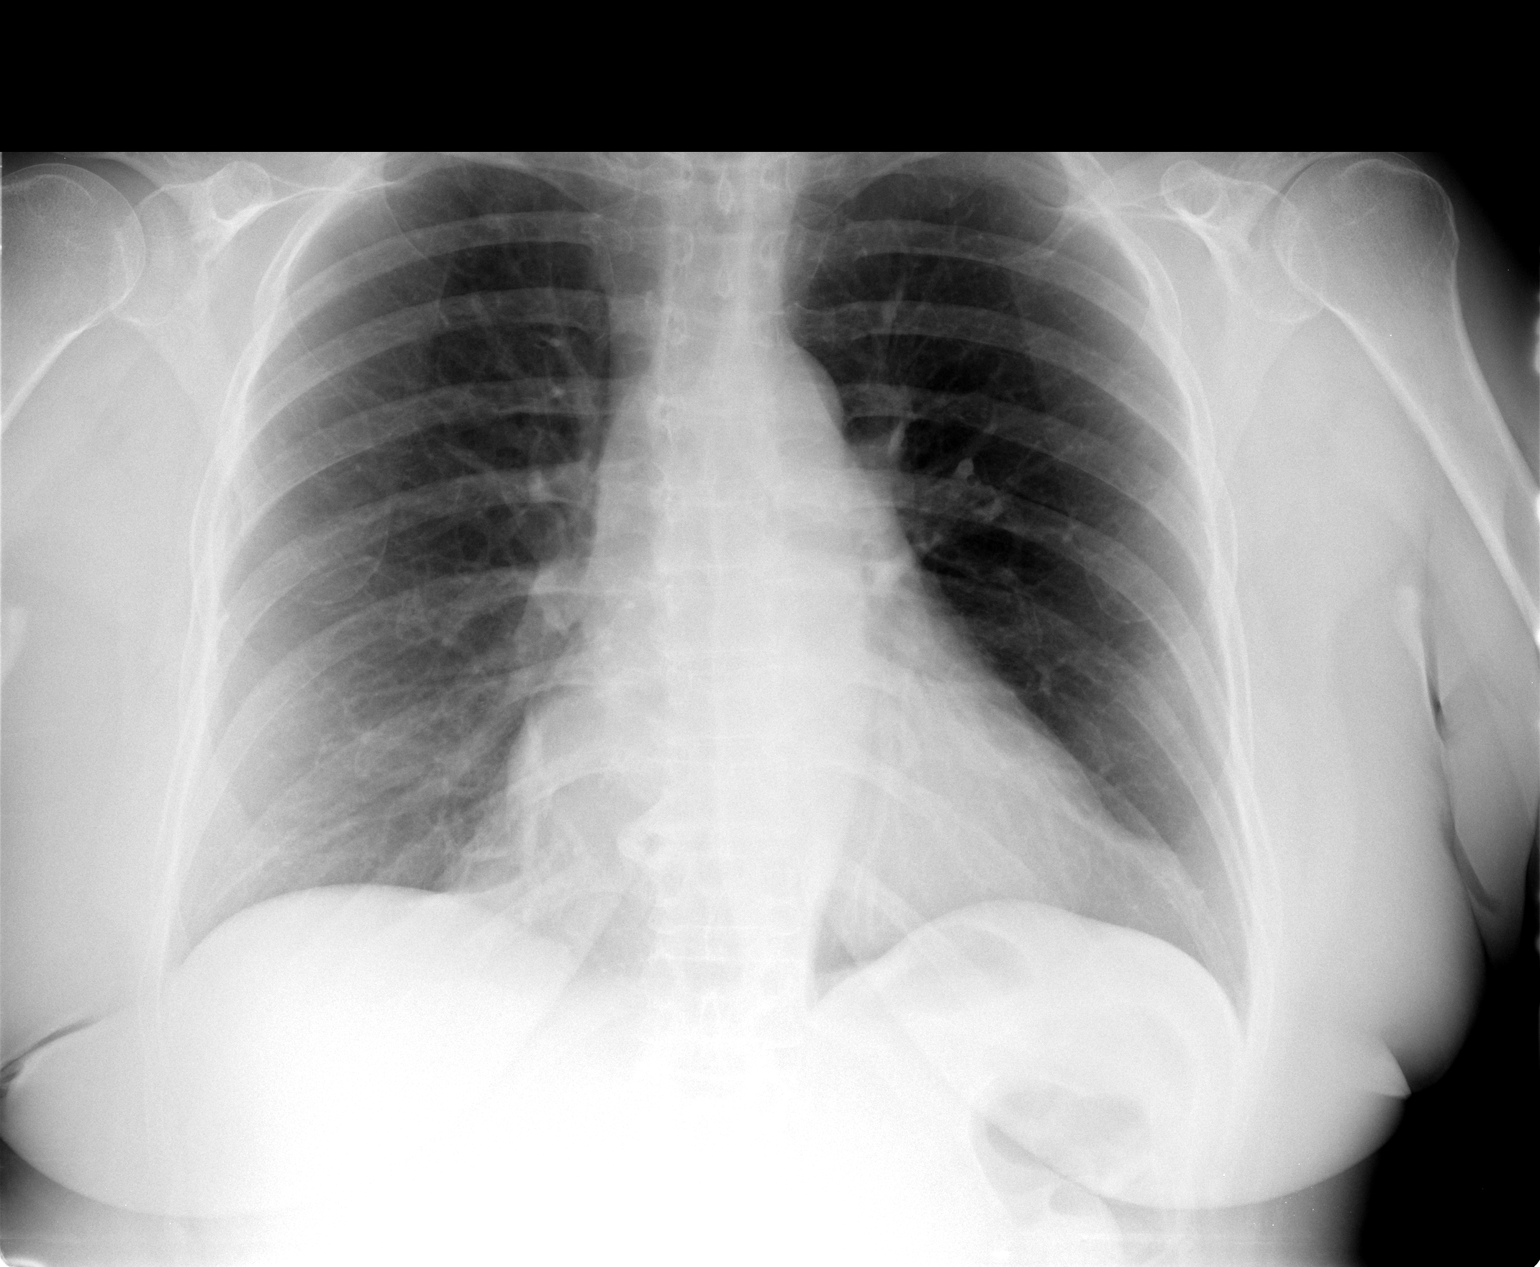

[2 of 2 positions shown; findings below may reference images not displayed]

FINDINGS: Cardiac silhouette is borderline in size accentuated by
AP magnification.  Slight tortuosity and ectasia of thoracic aorta
are seen.  Lungs are free of infiltrates.  No pleural disease is
evident.  There is osteophyte formation in the spine.  There is
slightly osteopenic appearance of bones.
IMPRESSION: Borderline cardiac size with no evidence of pulmonary edema,
pneumonia, or other acute process.

## 2011-10-30 ENCOUNTER — Ambulatory Visit (INDEPENDENT_AMBULATORY_CARE_PROVIDER_SITE_OTHER): Payer: BC Managed Care – PPO | Admitting: Family Medicine

## 2011-10-30 ENCOUNTER — Encounter: Payer: Self-pay | Admitting: Family Medicine

## 2011-10-30 DIAGNOSIS — E785 Hyperlipidemia, unspecified: Secondary | ICD-10-CM

## 2011-10-30 DIAGNOSIS — E669 Obesity, unspecified: Secondary | ICD-10-CM

## 2011-10-30 DIAGNOSIS — E119 Type 2 diabetes mellitus without complications: Secondary | ICD-10-CM

## 2011-10-30 DIAGNOSIS — I10 Essential (primary) hypertension: Secondary | ICD-10-CM

## 2011-10-30 DIAGNOSIS — E1169 Type 2 diabetes mellitus with other specified complication: Secondary | ICD-10-CM

## 2011-10-30 DIAGNOSIS — E1159 Type 2 diabetes mellitus with other circulatory complications: Secondary | ICD-10-CM

## 2011-10-30 LAB — POCT GLYCOSYLATED HEMOGLOBIN (HGB A1C): Hemoglobin A1C: 5.9

## 2011-10-30 MED ORDER — PIOGLITAZONE HCL 30 MG PO TABS: 30.0000 mg | ORAL_TABLET | Freq: Every day | ORAL | Status: DC

## 2011-10-30 MED ORDER — LISINOPRIL-HYDROCHLOROTHIAZIDE 20-12.5 MG PO TABS: 1.0000 | ORAL_TABLET | Freq: Every day | ORAL | Status: DC

## 2011-10-30 MED ORDER — ATORVASTATIN CALCIUM 10 MG PO TABS: 10.0000 mg | ORAL_TABLET | Freq: Every day | ORAL | Status: DC

## 2011-10-30 NOTE — Patient Instructions (Signed)
Keep working on trying to get the scar more loose. His family are present medications. Now that your knee is doing better keep working on exercise.

## 2011-10-30 NOTE — Progress Notes (Signed)
  Subjective:    Patient ID: [name], female    DOB: 1950-11-20, 61 y.o.   MRN: [id number]  HPI She is here for a recheck. She recently had surgery to remove some wires and screws in her right knee from patellar fracture. She still does have some difficulty with full range of motion. She continues on medications listed in the chart. She does not smoke or drink. Her activities have been limited by the recent surgery. Her home life is going quite well.   Review of Systems     Objective:   Physical Exam Alert and in no distress. Hemoglobin A1c is 5.9. Exam of the right knee does show a vertical scar that is here to the underlying tissue.       Assessment & Plan:   1. Diabetes mellitus  POCT HgB A1C  2. Hyperlipidemia LDL goal <70    3. Hypertension associated with diabetes    4. Obesity (BMI 30-39.9)     recent knee surgery. Demonstrated scar mobilization to her and recommended she work on this to help free up the underlying tissues from the scar. Recheck here in 4 months. Her should increase her physical activity.

## 2011-11-26 ENCOUNTER — Ambulatory Visit (INDEPENDENT_AMBULATORY_CARE_PROVIDER_SITE_OTHER): Payer: BC Managed Care – PPO | Admitting: Family Medicine

## 2011-11-26 ENCOUNTER — Encounter: Payer: Self-pay | Admitting: Family Medicine

## 2011-11-26 VITALS — BP 130/70 | HR 72 | Temp 97.9°F | Ht 65.0 in | Wt 202.0 lb

## 2011-11-26 DIAGNOSIS — R05 Cough: Secondary | ICD-10-CM

## 2011-11-26 DIAGNOSIS — J069 Acute upper respiratory infection, unspecified: Secondary | ICD-10-CM

## 2011-11-26 DIAGNOSIS — R059 Cough, unspecified: Secondary | ICD-10-CM

## 2011-11-26 MED ORDER — BENZONATATE 200 MG PO CAPS: 200.0000 mg | ORAL_CAPSULE | Freq: Three times a day (TID) | ORAL | Status: AC | PRN

## 2011-11-26 NOTE — Patient Instructions (Signed)
URI, improving.  Reviewed signs and symptoms of bacterial infection and to call or return should symptoms progress/worsen--ie fever, discolored mucus, sinus pain.  Trial of tessalon for cough.  Continue Claritin and Mucinex DM as needed. Drink plenty of fluids. Try sinus rinses or Neti-Pot of you develop sinus pain (do not use decongestants)

## 2011-11-26 NOTE — Progress Notes (Signed)
Chief complaint:  started with sore throat last Monday, progressed to cough and sinus drainage by Wednesday. Mucus is mostly clear.  HPI:  One week ago she began with sore throat, then 2 days later progressed to cough and drainage.  Denies sinus pain.  Some head congestion, mucus is clear.  Throat pain is improving.  Coughing a little more in the evenings, phlegm is clear.  Denies shortness of breath. Denies any fevers.  Has been taking Claritin, Mucinex DM x 3-4 days, and seems to be helping.  Symptoms are overall improving.  Past Medical History  Diagnosis Date  . Diabetes mellitus   . Hypertension   . Dyslipidemia   . Obesity     Past Surgical History  Procedure Date  . Total abdominal hysterectomy w/ bilateral salpingoophorectomy     fibroids  . Fractured patella repair 02/2011    Dr. Thomasena Edis; R knee    History   Social History  . Marital Status: Married    Spouse Name: N/A    Number of Children: 2  . Years of Education: N/A   Occupational History  . shipping    Social History Main Topics  . Smoking status: Never Smoker   . Smokeless tobacco: Never Used  . Alcohol Use: No  . Drug Use: No  . Sexually Active: Yes   Other Topics Concern  . Not on file   Social History Narrative  . No narrative on file   Current outpatient prescriptions:aspirin 81 MG tablet, Take 81 mg by mouth daily.  , Disp: , Rfl: ;  atorvastatin (LIPITOR) 10 MG tablet, Take 1 tablet (10 mg total) by mouth daily., Disp: 90 tablet, Rfl: 3;  calcium carbonate (OS-CAL) 600 MG TABS, Take 600 mg by mouth 2 (two) times daily with a meal.  , Disp: , Rfl: ;  cholecalciferol (VITAMIN D) 1000 UNITS tablet, Take 1,000 Units by mouth daily. , Disp: , Rfl:  dextromethorphan-guaiFENesin (MUCINEX DM) 30-600 MG per 12 hr tablet, Take 1 tablet by mouth every 12 (twelve) hours., Disp: , Rfl: ;  fish oil-omega-3 fatty acids 1000 MG capsule, Take 1 g by mouth daily. , Disp: , Rfl: ;  lisinopril-hydrochlorothiazide  (PRINZIDE,ZESTORETIC) 20-12.5 MG per tablet, Take 1 tablet by mouth daily., Disp: 90 tablet, Rfl: 3;  loratadine (CLARITIN) 10 MG tablet, Take 10 mg by mouth daily., Disp: , Rfl:  Multiple Vitamins-Minerals (MULTIVITAMIN WITH MINERALS) tablet, Take 1 tablet by mouth daily.  , Disp: , Rfl: ;  pioglitazone (ACTOS) 30 MG tablet, Take 1 tablet (30 mg total) by mouth daily., Disp: 90 tablet, Rfl: 1  No Known Allergies  ROS:  Denies nausea, vomiting, diarrhea, skin rashes, abdominal pain.  PHYSICAL EXAM: BP 130/70  Pulse 72  Temp(Src) 97.9 F (36.6 C) (Oral)  Ht 5\' 5"  (1.651 m)  Wt 202 lb (91.627 kg)  BMI 33.61 kg/m2 Well developed, well nourished female in no distress. No cough or sniffling during visit HEENT:  PERRL, EOMI, conjunctive clear.  TM's and EAC's normal.  OP with some cobblestoning posteriorly, otherwise normal.  Sinuses nontender. Nasal mucosa mild-mod edematous (R>L), nonerythematous, no purulence. Neck: no lymphadenopathy Heart: regular rate and rhythm without murmur Lungs: clear bilaterally.  No wheezes with forced expiration Skin: no rash  ASSESSMENT/PLAN:  1. URI (upper respiratory infection)    2. Cough  benzonatate (TESSALON) 200 MG capsule    URI, improving.  Reviewed signs and symptoms of bacterial infection and to call or return should symptoms progress/worsen. Trial  of tessalon for cough.  Continue Claritin and Mucinex DM as needed

## 2011-11-28 ENCOUNTER — Ambulatory Visit: Payer: Self-pay | Admitting: Medical

## 2012-02-19 ENCOUNTER — Encounter: Payer: Self-pay | Admitting: Family Medicine

## 2012-02-28 ENCOUNTER — Encounter: Payer: Self-pay | Admitting: Family Medicine

## 2012-02-28 ENCOUNTER — Ambulatory Visit: Payer: Self-pay | Admitting: Family Medicine

## 2012-02-28 ENCOUNTER — Ambulatory Visit (INDEPENDENT_AMBULATORY_CARE_PROVIDER_SITE_OTHER): Payer: BC Managed Care – PPO | Admitting: Family Medicine

## 2012-02-28 VITALS — BP 124/82 | HR 66 | Wt 198.0 lb

## 2012-02-28 DIAGNOSIS — Z23 Encounter for immunization: Secondary | ICD-10-CM

## 2012-02-28 DIAGNOSIS — E1169 Type 2 diabetes mellitus with other specified complication: Secondary | ICD-10-CM

## 2012-02-28 DIAGNOSIS — I10 Essential (primary) hypertension: Secondary | ICD-10-CM

## 2012-02-28 DIAGNOSIS — E669 Obesity, unspecified: Secondary | ICD-10-CM

## 2012-02-28 DIAGNOSIS — E1159 Type 2 diabetes mellitus with other circulatory complications: Secondary | ICD-10-CM

## 2012-02-28 DIAGNOSIS — E785 Hyperlipidemia, unspecified: Secondary | ICD-10-CM

## 2012-02-28 DIAGNOSIS — E119 Type 2 diabetes mellitus without complications: Secondary | ICD-10-CM

## 2012-02-28 LAB — POCT GLYCOSYLATED HEMOGLOBIN (HGB A1C): Hemoglobin A1C: 6.4

## 2012-02-28 NOTE — Patient Instructions (Signed)
Keep working on trying to increase your exercise.

## 2012-02-28 NOTE — Progress Notes (Signed)
  Subjective:    Patient ID: [name], female    DOB: 1951/03/30, 61 y.o.   MRN: [id number]  HPI She is here for a diabetes recheck. She states her blood sugars run in the low 100s. She has cut back on her exercise due to some knee discomfort. Her last eye exam was October. She does check her feet periodically. She continues to work. Her marriage is going well. Her meds were reviewed. She has never been on metformin. Social history was reviewed.   Review of Systems     Objective:   Physical Exam Alert and in no distress. Hemoglobin A1c is 6.4       Assessment & Plan:   1. Type II or unspecified type diabetes mellitus without mention of complication, not stated as uncontrolled  POCT HgB A1C  2. Hypertension associated with diabetes    3. Hyperlipidemia LDL goal <70    4. Obesity (BMI 30-39.9)     encouraged her to increase her physical activities. Recheck here in 4 months.

## 2012-06-30 ENCOUNTER — Encounter: Payer: Self-pay | Admitting: Family Medicine

## 2012-06-30 ENCOUNTER — Other Ambulatory Visit: Payer: Self-pay

## 2012-06-30 ENCOUNTER — Telehealth: Payer: Self-pay | Admitting: Internal Medicine

## 2012-06-30 ENCOUNTER — Ambulatory Visit (INDEPENDENT_AMBULATORY_CARE_PROVIDER_SITE_OTHER): Payer: BC Managed Care – PPO | Admitting: Family Medicine

## 2012-06-30 VITALS — BP 128/82 | HR 71 | Ht 63.0 in | Wt 203.0 lb

## 2012-06-30 DIAGNOSIS — E1159 Type 2 diabetes mellitus with other circulatory complications: Secondary | ICD-10-CM

## 2012-06-30 DIAGNOSIS — Z23 Encounter for immunization: Secondary | ICD-10-CM

## 2012-06-30 DIAGNOSIS — E119 Type 2 diabetes mellitus without complications: Secondary | ICD-10-CM

## 2012-06-30 DIAGNOSIS — E785 Hyperlipidemia, unspecified: Secondary | ICD-10-CM

## 2012-06-30 DIAGNOSIS — I10 Essential (primary) hypertension: Secondary | ICD-10-CM

## 2012-06-30 DIAGNOSIS — Z0000 Encounter for general adult medical examination without abnormal findings: Secondary | ICD-10-CM

## 2012-06-30 DIAGNOSIS — J302 Other seasonal allergic rhinitis: Secondary | ICD-10-CM | POA: Insufficient documentation

## 2012-06-30 DIAGNOSIS — J309 Allergic rhinitis, unspecified: Secondary | ICD-10-CM

## 2012-06-30 DIAGNOSIS — E1169 Type 2 diabetes mellitus with other specified complication: Secondary | ICD-10-CM

## 2012-06-30 DIAGNOSIS — E669 Obesity, unspecified: Secondary | ICD-10-CM

## 2012-06-30 LAB — POCT UA - MICROALBUMIN
Albumin/Creatinine Ratio, Urine, POC: <8.2
Creatinine, POC: 61.2 mg/dL

## 2012-06-30 LAB — POCT URINALYSIS DIPSTICK
Blood, UA: NEGATIVE
Glucose, UA: NEGATIVE
Nitrite, UA: NEGATIVE
Protein, UA: NEGATIVE
Spec Grav, UA: 1.01
Urobilinogen, UA: NEGATIVE
pH, UA: 6

## 2012-06-30 LAB — POCT GLYCOSYLATED HEMOGLOBIN (HGB A1C): Hemoglobin A1C: 6

## 2012-06-30 MED ORDER — GLUCOSE BLOOD VI STRP: ORAL_STRIP | Status: DC

## 2012-06-30 MED ORDER — PIOGLITAZONE HCL 30 MG PO TABS: 30.0000 mg | ORAL_TABLET | Freq: Every day | ORAL | Status: DC

## 2012-06-30 MED ORDER — ONETOUCH LANCETS MISC: Status: DC

## 2012-06-30 NOTE — Progress Notes (Signed)
Pt has appt with Dr.Gould Oct.1 at 10 am

## 2012-06-30 NOTE — Telephone Encounter (Signed)
Talked to the pt earlier and told her she could put it in a smaller bottle and put in suitcase that way she could take those pills

## 2012-06-30 NOTE — Progress Notes (Signed)
Subjective:    Patient ID: [name], female    DOB: 1950/11/18, 61 y.o.   MRN: [id number]  HPI She is here for regular checkup. She continues on medications listed in the chart. She continues to work. Her physical activity level is quite minimal. She does not smoke or drink. Her marriage is going well. Her allergies are under good control. Her weight has been stable. She does check her blood sugars periodically. Review of her record indicates she is up-to-date on her immunizations as well as colonoscopy. She does have a mammogram scheduled in the near future. Her last Pap was 2 years ago. She did have recent blood work done through work which did show good cholesterol parameters. Review of Systems  Constitutional: Negative.   HENT: Negative.   Eyes: Negative.   Respiratory: Negative.   Cardiovascular: Negative.   Gastrointestinal: Negative.   Genitourinary: Negative.   Musculoskeletal: Negative.   Skin: Negative.   Neurological: Negative.   Hematological: Negative.   Psychiatric/Behavioral: Negative.        Objective:   Physical Exam BP 128/82  Pulse 71  Ht 5\' 3"  (1.6 m)  Wt 203 lb (92.08 kg)  BMI 35.96 kg/m2  General Appearance:    Alert, cooperative, no distress, appears stated age  Head:    Normocephalic, without obvious abnormality, atraumatic  Eyes:    PERRL, conjunctiva/corneas clear, EOM's intact, fundi    benign  Ears:    Normal TM's and external ear canals  Nose:   Nares normal, mucosa normal, no drainage or sinus   tenderness  Throat:   Lips, mucosa, and tongue normal; teeth and gums normal  Neck:   Supple, no lymphadenopathy;  thyroid:  no   enlargement/tenderness/nodules; no carotid   bruit or JVD  Back:    Spine nontender, no curvature, ROM normal, no CVA     tenderness  Lungs:     Clear to auscultation bilaterally without wheezes, rales or     ronchi; respirations unlabored  Chest Wall:    No tenderness or deformity   Heart:    Regular rate and rhythm, S1  and S2 normal, no murmur, rub   or gallop  Breast Exam:    Deferred to GYN  Abdomen:     Soft, non-tender, nondistended, normoactive bowel sounds,    no masses, no hepatosplenomegaly  Genitalia:    Deferred to GYN     Extremities:   No clubbing, cyanosis or edema  Pulses:   2+ and symmetric all extremities  Skin:   Skin color, texture, turgor normal, no rashes or lesions  Lymph nodes:   Cervical, supraclavicular, and axillary nodes normal  Neurologic:   CNII-XII intact, normal strength, sensation and gait; reflexes 2+ and symmetric throughout          Psych:   Normal mood, affect, hygiene and grooming.           Assessment & Plan:   1. Routine general medical examination at a health care facility  POCT Urinalysis Dipstick, Flu vaccine greater than or equal to 3yo preservative free IM  2. Diabetes mellitus  POCT glycosylated hemoglobin (Hb A1C), POCT UA - Microalbumin, pioglitazone (ACTOS) 30 MG tablet, Ambulatory referral to Ophthalmology  3. Allergic rhinitis, seasonal    4. Obesity (BMI 30-39.9)    5. Hyperlipidemia LDL goal <70    6. Hypertension associated with diabetes     continue on present medications. Discussed mammogram with her. Discussed the fact she did have  that done now or wait another year depending upon which criteria she would like to follow. She will continue on her present medications.

## 2012-06-30 NOTE — Telephone Encounter (Signed)
Have her take just another medicine to get her through the trip

## 2012-06-30 NOTE — Telephone Encounter (Signed)
Pt called wanting to know if you wanted her to follow-up in 4 month and pt is also going on a [place] and it says that when flying to carry all meds with you and pt states that her calicum and multi-vitamin are in huge bottle and pt wants to know if she can go 2 weeks without taking them or can she put them in a small bottle and put in suitcase if they allow that.

## 2012-07-03 ENCOUNTER — Encounter: Payer: Self-pay | Admitting: Family Medicine

## 2012-07-21 ENCOUNTER — Encounter: Payer: Self-pay | Admitting: Medical

## 2012-07-21 ENCOUNTER — Ambulatory Visit (INDEPENDENT_AMBULATORY_CARE_PROVIDER_SITE_OTHER): Payer: BC Managed Care – PPO | Admitting: Medical

## 2012-07-21 VITALS — BP 128/70 | HR 84 | Temp 98.1°F | Resp 16 | Wt 208.0 lb

## 2012-07-21 DIAGNOSIS — L988 Other specified disorders of the skin and subcutaneous tissue: Secondary | ICD-10-CM

## 2012-07-21 DIAGNOSIS — L089 Local infection of the skin and subcutaneous tissue, unspecified: Secondary | ICD-10-CM

## 2012-07-21 DIAGNOSIS — R238 Other skin changes: Secondary | ICD-10-CM

## 2012-07-21 NOTE — Progress Notes (Signed)
Subjective: Here for c/o sore in right ear.  She was on a cruise to [place] last week, doesn't recall injury or bug bite, but last few days had irritation inside right ear.  There is a sore there she wants looked at.  Has some discomfort, but no drainage.   Using nothing on the sore.   No prior similar.  No hx/ shingles, no hx/o herpes lesions, no fever, arthralgias, malaise or other new symptoms.    ROS as above  Objective: Gen: wd, wn, nad Skin: right external ear posteriorly within pinna with 4 small vesicular lesions in a group, slight erythema, but no drainage, no crusting Ears: TMs normal, no other ear lesions Neck: supple, no lymphadenopathy  Assessment: Encounter Diagnoses  Name Primary?  . Vesicles Yes  . Skin infection    Plan: With her consent, cleaned area with alcohol, used #15 blade to incise and unroof vesicle, obtained viral culture swab which was sent.   Advised that given the appearance, could be staph infection vs herpetic type lesion.  Will treat as staphylococcus infection with topical antibiotic ointment OTC, keep area clean, and await viral culture.   Advised of signs/symptoms of shingles that would prompt recheck, but currently not clinically appearing this way.  Return if not resolving within a week.

## 2012-07-21 NOTE — Addendum Note (Signed)
Addended by: Janeice Robinson on: 07/21/2012 05:17 PM   Modules accepted: Orders

## 2012-07-22 ENCOUNTER — Other Ambulatory Visit: Payer: Self-pay | Admitting: Family Medicine

## 2012-07-24 ENCOUNTER — Encounter: Payer: Self-pay | Admitting: Internal Medicine

## 2012-10-11 ENCOUNTER — Emergency Department (HOSPITAL_BASED_OUTPATIENT_CLINIC_OR_DEPARTMENT_OTHER): Payer: Worker's Compensation

## 2012-10-11 ENCOUNTER — Inpatient Hospital Stay (HOSPITAL_COMMUNITY): Payer: Worker's Compensation

## 2012-10-11 ENCOUNTER — Encounter (HOSPITAL_BASED_OUTPATIENT_CLINIC_OR_DEPARTMENT_OTHER): Payer: Self-pay | Admitting: *Deleted

## 2012-10-11 ENCOUNTER — Inpatient Hospital Stay (HOSPITAL_BASED_OUTPATIENT_CLINIC_OR_DEPARTMENT_OTHER)
Admission: EM | Admit: 2012-10-11 | Discharge: 2012-10-14 | DRG: 493 | Disposition: A | Discharge status: Other Acute Inpatient Hospital | Payer: Worker's Compensation | Attending: Emergency Medicine | Admitting: Emergency Medicine

## 2012-10-11 DIAGNOSIS — E1159 Type 2 diabetes mellitus with other circulatory complications: Secondary | ICD-10-CM | POA: Diagnosis present

## 2012-10-11 DIAGNOSIS — W010XXA Fall on same level from slipping, tripping and stumbling without subsequent striking against object, initial encounter: Secondary | ICD-10-CM | POA: Diagnosis present

## 2012-10-11 DIAGNOSIS — Z7982 Long term (current) use of aspirin: Secondary | ICD-10-CM

## 2012-10-11 DIAGNOSIS — E118 Type 2 diabetes mellitus with unspecified complications: Secondary | ICD-10-CM | POA: Diagnosis present

## 2012-10-11 DIAGNOSIS — Y990 Civilian activity done for income or pay: Secondary | ICD-10-CM

## 2012-10-11 DIAGNOSIS — Z79899 Other long term (current) drug therapy: Secondary | ICD-10-CM

## 2012-10-11 DIAGNOSIS — S82109A Unspecified fracture of upper end of unspecified tibia, initial encounter for closed fracture: Principal | ICD-10-CM | POA: Diagnosis present

## 2012-10-11 DIAGNOSIS — I10 Essential (primary) hypertension: Secondary | ICD-10-CM | POA: Diagnosis present

## 2012-10-11 DIAGNOSIS — S82143A Displaced bicondylar fracture of unspecified tibia, initial encounter for closed fracture: Secondary | ICD-10-CM

## 2012-10-11 DIAGNOSIS — E119 Type 2 diabetes mellitus without complications: Secondary | ICD-10-CM

## 2012-10-11 DIAGNOSIS — E785 Hyperlipidemia, unspecified: Secondary | ICD-10-CM | POA: Diagnosis present

## 2012-10-11 DIAGNOSIS — D62 Acute posthemorrhagic anemia: Secondary | ICD-10-CM

## 2012-10-11 DIAGNOSIS — E1169 Type 2 diabetes mellitus with other specified complication: Secondary | ICD-10-CM | POA: Diagnosis present

## 2012-10-11 DIAGNOSIS — Z6837 Body mass index (BMI) 37.0-37.9, adult: Secondary | ICD-10-CM

## 2012-10-11 DIAGNOSIS — E669 Obesity, unspecified: Secondary | ICD-10-CM | POA: Diagnosis present

## 2012-10-11 LAB — CBC
HCT: 36.3 % (ref 36.0–46.0)
Hemoglobin: 12 g/dL (ref 12.0–15.0)
MCH: 30.8 pg (ref 26.0–34.0)
MCHC: 33.1 g/dL (ref 30.0–36.0)
MCV: 93.3 fL (ref 78.0–100.0)
Platelets: 206 10*3/uL (ref 150–400)
RBC: 3.89 MIL/uL (ref 3.87–5.11)
RDW: 13.6 % (ref 11.5–15.5)
WBC: 6.7 10*3/uL (ref 4.0–10.5)

## 2012-10-11 LAB — URINALYSIS, ROUTINE W REFLEX MICROSCOPIC
Bilirubin Urine: NEGATIVE
Glucose, UA: NEGATIVE mg/dL
Hgb urine dipstick: NEGATIVE
Ketones, ur: NEGATIVE mg/dL
Leukocytes, UA: NEGATIVE
Nitrite: NEGATIVE
Protein, ur: NEGATIVE mg/dL
Specific Gravity, Urine: 1.01 (ref 1.005–1.030)
Urobilinogen, UA: 0.2 mg/dL (ref 0.0–1.0)
pH: 7 (ref 5.0–8.0)

## 2012-10-11 LAB — BASIC METABOLIC PANEL
BUN: 14 mg/dL (ref 6–23)
Calcium: 9.8 mg/dL (ref 8.4–10.5)
Creatinine, Ser: 0.65 mg/dL (ref 0.50–1.10)
GFR calc non Af Amer: >90 mL/min (ref >90)
Glucose, Bld: 112 mg/dL — ABNORMAL HIGH (ref 70–99)
Potassium: 3.6 mEq/L (ref 3.5–5.1)

## 2012-10-11 LAB — PROTIME-INR
INR: 1.02 (ref 0.00–1.49)
Prothrombin Time: 13.3 seconds (ref 11.6–15.2)

## 2012-10-11 LAB — GLUCOSE, CAPILLARY: Glucose-Capillary: 161 mg/dL — ABNORMAL HIGH (ref 70–99)

## 2012-10-11 MED ORDER — INSULIN ASPART 100 UNIT/ML ~~LOC~~ SOLN
0.0000 [IU] | SUBCUTANEOUS | Status: DC
  Administered 2012-10-12: 3 [IU] via SUBCUTANEOUS

## 2012-10-11 MED ORDER — ACETAMINOPHEN 650 MG RE SUPP: 650.0000 mg | Freq: Four times a day (QID) | RECTAL | Status: DC | PRN

## 2012-10-11 MED ORDER — MORPHINE SULFATE 2 MG/ML IJ SOLN
2.0000 mg | INTRAMUSCULAR | Status: DC | PRN
  Administered 2012-10-11 – 2012-10-12 (×2): 2 mg via INTRAVENOUS
  Filled 2012-10-11 (×2): qty 1

## 2012-10-11 MED ORDER — CEFAZOLIN SODIUM-DEXTROSE 2-3 GM-% IV SOLR
2.0000 g | Freq: Once | INTRAVENOUS | Status: AC
  Administered 2012-10-12: 2 g via INTRAVENOUS
  Filled 2012-10-11: qty 50

## 2012-10-11 MED ORDER — HYDROMORPHONE HCL PF 1 MG/ML IJ SOLN
1.0000 mg | INTRAMUSCULAR | Status: DC | PRN
  Administered 2012-10-11: 1 mg via INTRAVENOUS
  Filled 2012-10-11: qty 1

## 2012-10-11 MED ORDER — ACETAMINOPHEN 325 MG PO TABS: 650.0000 mg | ORAL_TABLET | Freq: Four times a day (QID) | ORAL | Status: DC | PRN

## 2012-10-11 MED ORDER — LISINOPRIL 20 MG PO TABS
20.0000 mg | ORAL_TABLET | Freq: Every day | ORAL | Status: DC
  Administered 2012-10-13 – 2012-10-14 (×2): 20 mg via ORAL
  Filled 2012-10-11 (×4): qty 1

## 2012-10-11 MED ORDER — ONDANSETRON HCL 4 MG PO TABS: 4.0000 mg | ORAL_TABLET | Freq: Four times a day (QID) | ORAL | Status: DC | PRN

## 2012-10-11 MED ORDER — ONDANSETRON HCL 4 MG/2ML IJ SOLN
4.0000 mg | Freq: Four times a day (QID) | INTRAMUSCULAR | Status: DC | PRN
  Filled 2012-10-11: qty 2

## 2012-10-11 MED ORDER — LISINOPRIL-HYDROCHLOROTHIAZIDE 20-12.5 MG PO TABS: 1.0000 | ORAL_TABLET | Freq: Every day | ORAL | Status: DC

## 2012-10-11 MED ORDER — ATORVASTATIN CALCIUM 10 MG PO TABS
10.0000 mg | ORAL_TABLET | Freq: Every day | ORAL | Status: DC
  Administered 2012-10-13 – 2012-10-14 (×2): 10 mg via ORAL
  Filled 2012-10-11 (×4): qty 1

## 2012-10-11 MED ORDER — ASPIRIN EC 81 MG PO TBEC
81.0000 mg | DELAYED_RELEASE_TABLET | Freq: Every day | ORAL | Status: DC
  Filled 2012-10-11 (×3): qty 1

## 2012-10-11 MED ORDER — HYDROCHLOROTHIAZIDE 12.5 MG PO CAPS
12.5000 mg | ORAL_CAPSULE | Freq: Every day | ORAL | Status: DC
  Administered 2012-10-13 – 2012-10-14 (×2): 12.5 mg via ORAL
  Filled 2012-10-11 (×4): qty 1

## 2012-10-11 MED ORDER — ONDANSETRON HCL 4 MG/2ML IJ SOLN
4.0000 mg | Freq: Four times a day (QID) | INTRAMUSCULAR | Status: DC | PRN
  Administered 2012-10-11 – 2012-10-12 (×2): 4 mg via INTRAVENOUS
  Filled 2012-10-11: qty 2

## 2012-10-11 MED ORDER — PIOGLITAZONE HCL 30 MG PO TABS
30.0000 mg | ORAL_TABLET | Freq: Every day | ORAL | Status: DC
  Filled 2012-10-11 (×2): qty 1

## 2012-10-11 NOTE — ED Notes (Signed)
Pts husband is very upset.  He states that he will not take care of her and that he can not help her due to his arthritis.  Husband is quite verbal about pt not coming to "MY house" unless a ambulance brings her.  Will plan on asking pt about her being safe at home.

## 2012-10-11 NOTE — ED Notes (Addendum)
Pt will go to 5500 and get a bed PTA there.  This is per MD and charge RN.  Report was given to [facility]

## 2012-10-11 NOTE — ED Notes (Signed)
Patient transported to X-ray 

## 2012-10-11 NOTE — H&P (Signed)
Cindy Herter, MD Chief Complaint: Fall  History: Patient tripped at work today and stomped her leg.  Noted immediate pain and swelling and inability to ambulate.  Brought to outside ER and transferred to [facility] for definitive fx management.    Past Medical History  Diagnosis Date  . Diabetes mellitus   . Hypertension   . Dyslipidemia   . Obesity     No Known Allergies  No current facility-administered medications on file prior to encounter.   Current Outpatient Prescriptions on File Prior to Encounter  Medication Sig Dispense Refill  . aspirin 81 MG tablet Take 81 mg by mouth daily.        Marland Kitchen atorvastatin (LIPITOR) 10 MG tablet Take 1 tablet (10 mg total) by mouth daily.  90 tablet  3  . calcium carbonate (OS-CAL) 600 MG TABS Take 600 mg by mouth 2 (two) times daily with a meal.        . cholecalciferol (VITAMIN D) 1000 UNITS tablet Take 1,000 Units by mouth daily.       . fish oil-omega-3 fatty acids 1000 MG capsule Take 1 g by mouth daily.       Marland Kitchen glucose blood test strip USE AS DIRECTED  100 each  PRN  . lisinopril-hydrochlorothiazide (PRINZIDE,ZESTORETIC) 20-12.5 MG per tablet Take 1 tablet by mouth daily.  90 tablet  3  . loratadine (CLARITIN) 10 MG tablet Take 10 mg by mouth daily.      . Multiple Vitamins-Minerals (MULTIVITAMIN WITH MINERALS) tablet Take 1 tablet by mouth daily.        . ONE TOUCH LANCETS MISC USE AS DIRECTED  200 each  PRN  . pioglitazone (ACTOS) 30 MG tablet Take 1 tablet (30 mg total) by mouth daily.  90 tablet  1    Physical Exam: Filed Vitals:   10/11/12 1932  BP: 118/46  Pulse: 73  Temp: 99.3 F (37.4 C)  Resp: 18  NO SOB/CP Abd Soft/NT NVI Compartments soft/NT No eccyhmosis or skin compromise.  Mild effusion noted Intact DP/PT EHL/TA/GA intact No hip/ankle pain  Image: Dg Tibia/fibula Right  10/11/2012  *RADIOLOGY REPORT*  Clinical Data: Larey Seat at work, pain from right to knee to right ankle  RIGHT TIBIA AND FIBULA - 2 VIEW   Comparison: Right knee radiographs 10/11/2012  Findings: Osseous demineralization. Again identified depressed and minimally displaced intra-articular fracture of the lateral tibial plateau. Remainder of the right tibia and fibula appear intact. Ankle joint spaces preserved. No additional fracture or dislocation. Plantar and Achilles insertion calcaneal spurs. No definite soft tissue abnormalities.  IMPRESSION: Depressed and minimally displaced intra-articular fracture of the lateral tibial plateau.   Original Report Authenticated By: Ulyses Southward, M.D.    Dg Ankle Complete Right  10/11/2012  *RADIOLOGY REPORT*  Clinical Data: Larey Seat at work, pain for right medial right ankle  RIGHT ANKLE - COMPLETE 3+ VIEW  Comparison: None  Findings: Osseous demineralization. Ankle mortise intact. No acute fracture, dislocation or bone destruction. Plantar and Achilles insertion calcaneal spur formation.  IMPRESSION: Calcaneal spurring. Osseous demineralization. No acute abnormalities; please refer to the right knee radiograph report for discussion of a lateral tibial plateau fracture.   Original Report Authenticated By: Ulyses Southward, M.D.    Dg Knee Complete 4 Views Right  10/11/2012  *RADIOLOGY REPORT*  Clinical Data: Right knee injury, pain post fall, unable to bear weight  RIGHT KNEE - COMPLETE 4+ VIEW  Comparison: 07/18/1999  Findings: Osseous demineralization. Mildly depressed and displaced fracture of the  lateral tibial plateau. Minimal knee joint effusion. Old post-traumatic deformity of prior patellar fracture. No additional acute fracture or dislocation. Calcifications are seen just anterior to the distal quadriceps tendon, new, question sequela of prior injury.  IMPRESSION: Mildly depressed and displaced fracture involving the lateral tibial plateau. Osseous demineralization. Old healed post-traumatic deformity of the patella.   Original Report Authenticated By: Ulyses Southward, M.D.     A/P:  Patient s/p ORIF of the  patella 1 yr ago with recent fall.  Presents to ER with painful, swollen left knee.  Xrays demonstrate lateral split/depression fx of the tibial plateau.  Transferred to [facility] for definitive fx management. Spoke with Dr Carola Frost - he will eval patient in the AM for ORIF  NPO after midnight Pre-op labs/ekg/consent to be done tonight.

## 2012-10-11 NOTE — ED Notes (Signed)
Have been on hold for [facility]N until now.  Hung up and called back.  RN states that she is unable to take report and will call me back.  Await call back from RN at this time

## 2012-10-11 NOTE — ED Notes (Signed)
Pt tripped at work, no fall.  Pt "caught herself" and when she did she "stomped" her foot down and had right knee pain since.  Pt was concerned due to past surgery to that knee one year ago.  No injury noted

## 2012-10-11 NOTE — ED Provider Notes (Signed)
Medical screening examination/treatment/procedure(s) were performed by non-physician practitioner and as supervising physician I was immediately available for consultation/collaboration.  Sydney Azure, MD 10/11/12 1659 

## 2012-10-11 NOTE — ED Notes (Signed)
Still await call back from RN for report.  RN was notified with each call that pt is en route with [facility]

## 2012-10-11 NOTE — ED Notes (Signed)
Call to 5N 

## 2012-10-11 NOTE — ED Notes (Signed)
Pt husband requests that pts drug test be done here.  Pts husband states that employer notified that pt may be checked here rather than go to other facility as originally instructed.  EMT is going in to collect specimen.

## 2012-10-11 NOTE — ED Notes (Signed)
Still on hold...

## 2012-10-11 NOTE — ED Provider Notes (Signed)
History     CSN: [id number]  Arrival date & time 10/11/12  1207   First MD Initiated Contact with Patient 10/11/12 1341      Chief Complaint  Patient presents with  . Knee Pain    (Consider location/radiation/quality/duration/timing/severity/associated sxs/prior treatment) Patient is a 61 y.o. female presenting with fall. The history is provided by the patient. No language interpreter was used.  Fall The accident occurred 1 to 2 hours ago. Distance fallen: Pt stumbled and stomped her foot  She landed on concrete. There was no blood loss. The point of impact was the right knee. The pain is present in the right knee. The pain is at a severity of 7/10. The pain is moderate. She was not ambulatory at the scene. There was no entrapment after the fall.  Pt complains of pain in right ankle and right knee.  Pt complains of soreness on the outside of right leg  Past Medical History  Diagnosis Date  . Diabetes mellitus   . Hypertension   . Dyslipidemia   . Obesity     Past Surgical History  Procedure Date  . Total abdominal hysterectomy w/ bilateral salpingoophorectomy     fibroids  . Fractured patella repair 02/2011    Dr. Thomasena Edis; R knee    No family history on file.  History  Substance Use Topics  . Smoking status: Never Smoker   . Smokeless tobacco: Never Used  . Alcohol Use: No    OB History    Grav Para Term Preterm Abortions TAB SAB Ect Mult Living                  Review of Systems  Musculoskeletal: Positive for myalgias and joint swelling.  All other systems reviewed and are negative.    Allergies  Review of patient's allergies indicates no known allergies.  Home Medications   Current Outpatient Rx  Name  Route  Sig  Dispense  Refill  . ASPIRIN 81 MG PO TABS   Oral   Take 81 mg by mouth daily.           . ATORVASTATIN CALCIUM 10 MG PO TABS   Oral   Take 1 tablet (10 mg total) by mouth daily.   90 tablet   3   . CALCIUM CARBONATE 600 MG PO  TABS   Oral   Take 600 mg by mouth 2 (two) times daily with a meal.           . VITAMIN D 1000 UNITS PO TABS   Oral   Take 1,000 Units by mouth daily.          . OMEGA-3 FATTY ACIDS 1000 MG PO CAPS   Oral   Take 1 g by mouth daily.          Marland Kitchen GLUCOSE BLOOD VI STRP      USE AS DIRECTED   100 each   PRN   . LISINOPRIL-HYDROCHLOROTHIAZIDE 20-12.5 MG PO TABS   Oral   Take 1 tablet by mouth daily.   90 tablet   3   . LORATADINE 10 MG PO TABS   Oral   Take 10 mg by mouth daily.         . MULTI-VITAMIN/MINERALS PO TABS   Oral   Take 1 tablet by mouth daily.           Letta Pate LANCETS MISC      USE AS DIRECTED   200 each  PRN   . PIOGLITAZONE HCL 30 MG PO TABS   Oral   Take 1 tablet (30 mg total) by mouth daily.   90 tablet   1     BP 142/51  Pulse 69  Temp 98.1 F (36.7 C) (Oral)  Resp 18  Ht 5\' 4"  (1.626 m)  Wt 205 lb (92.987 kg)  BMI 35.19 kg/m2  SpO2 99%  Physical Exam  Nursing note and vitals reviewed. Constitutional: She appears well-developed and well-nourished.  HENT:  Head: Normocephalic and atraumatic.  Eyes: Pupils are equal, round, and reactive to light.  Cardiovascular: Normal rate.   Pulmonary/Chest: Effort normal.  Abdominal: Soft.  Musculoskeletal: Normal range of motion.       Effusion right knee,  Diffusely tender, tender lateral aspect of tibia and tender right ankle,  nv and ns intact  Neurological: She is alert.  Skin: Skin is warm.  Psychiatric: She has a normal mood and affect.    ED Course  Procedures (including critical care time)  Labs Reviewed - No data to display Dg Knee Complete 4 Views Right  10/11/2012  *RADIOLOGY REPORT*  Clinical Data: Right knee injury, pain post fall, unable to bear weight  RIGHT KNEE - COMPLETE 4+ VIEW  Comparison: 07/18/1999  Findings: Osseous demineralization. Mildly depressed and displaced fracture of the lateral tibial plateau. Minimal knee joint effusion. Old post-traumatic  deformity of prior patellar fracture. No additional acute fracture or dislocation. Calcifications are seen just anterior to the distal quadriceps tendon, new, question sequela of prior injury.  IMPRESSION: Mildly depressed and displaced fracture involving the lateral tibial plateau. Osseous demineralization. Old healed post-traumatic deformity of the patella.   Original Report Authenticated By: Ulyses Southward, M.D.      No diagnosis found.    MDM  Pt's had a repair to patella tendon 1.5 years ago by Dr. Thomasena Edis.         Lonia Skinner [place], [place] 10/11/12 [phone]

## 2012-10-12 ENCOUNTER — Inpatient Hospital Stay (HOSPITAL_COMMUNITY): Payer: Worker's Compensation | Admitting: Anesthesiology

## 2012-10-12 ENCOUNTER — Encounter (HOSPITAL_COMMUNITY): Payer: Self-pay | Admitting: Anesthesiology

## 2012-10-12 ENCOUNTER — Encounter (HOSPITAL_COMMUNITY)
Admission: EM | Disposition: A | Discharge status: Other Acute Inpatient Hospital | Payer: Self-pay | Source: Home / Self Care | Attending: Orthopedic Surgery

## 2012-10-12 ENCOUNTER — Inpatient Hospital Stay (HOSPITAL_COMMUNITY): Payer: Worker's Compensation

## 2012-10-12 HISTORY — PX: ORIF TIBIA PLATEAU: SHX2132

## 2012-10-12 LAB — GLUCOSE, CAPILLARY: Glucose-Capillary: 165 mg/dL — ABNORMAL HIGH (ref 70–99)

## 2012-10-12 LAB — TYPE AND SCREEN
ABO/RH(D): O POS
Antibody Screen: NEGATIVE

## 2012-10-12 LAB — HEMOGLOBIN A1C: Hgb A1c MFr Bld: 6.6 % — ABNORMAL HIGH (ref <5.7)

## 2012-10-12 LAB — SURGICAL PCR SCREEN: Staphylococcus aureus: NEGATIVE

## 2012-10-12 SURGERY — OPEN REDUCTION INTERNAL FIXATION (ORIF) TIBIAL PLATEAU
Anesthesia: General | Site: Leg Lower | Laterality: Right | Wound class: Clean

## 2012-10-12 MED ORDER — INSULIN ASPART 100 UNIT/ML ~~LOC~~ SOLN
0.0000 [IU] | Freq: Three times a day (TID) | SUBCUTANEOUS | Status: DC
  Administered 2012-10-12: 2 [IU] via SUBCUTANEOUS
  Administered 2012-10-13: 3 [IU] via SUBCUTANEOUS
  Administered 2012-10-13 – 2012-10-14 (×2): 2 [IU] via SUBCUTANEOUS

## 2012-10-12 MED ORDER — POTASSIUM CHLORIDE IN NACL 20-0.9 MEQ/L-% IV SOLN
INTRAVENOUS | Status: DC
  Administered 2012-10-12: 16:00:00 via INTRAVENOUS
  Filled 2012-10-12 (×3): qty 1000

## 2012-10-12 MED ORDER — ACETAMINOPHEN 10 MG/ML IV SOLN
1000.0000 mg | Freq: Four times a day (QID) | INTRAVENOUS | Status: DC
  Administered 2012-10-12 – 2012-10-13 (×3): 1000 mg via INTRAVENOUS
  Filled 2012-10-12 (×4): qty 100

## 2012-10-12 MED ORDER — DEXTROSE 5 % IV SOLN
500.0000 mg | Freq: Four times a day (QID) | INTRAVENOUS | Status: DC | PRN
  Filled 2012-10-12: qty 5

## 2012-10-12 MED ORDER — BISACODYL 10 MG RE SUPP: 10.0000 mg | Freq: Every day | RECTAL | Status: DC | PRN

## 2012-10-12 MED ORDER — CEFAZOLIN SODIUM 1-5 GM-% IV SOLN
1.0000 g | Freq: Three times a day (TID) | INTRAVENOUS | Status: AC
  Administered 2012-10-12 – 2012-10-13 (×3): 1 g via INTRAVENOUS
  Filled 2012-10-12 (×3): qty 50

## 2012-10-12 MED ORDER — DEXTROSE 5 % IV SOLN
INTRAVENOUS | Status: DC | PRN
  Administered 2012-10-12: 12:00:00 via INTRAVENOUS

## 2012-10-12 MED ORDER — MIDAZOLAM HCL 5 MG/5ML IJ SOLN
INTRAMUSCULAR | Status: DC | PRN
  Administered 2012-10-12: 1 mg via INTRAVENOUS

## 2012-10-12 MED ORDER — PROPOFOL 10 MG/ML IV BOLUS
INTRAVENOUS | Status: DC | PRN
  Administered 2012-10-12: 200 mg via INTRAVENOUS

## 2012-10-12 MED ORDER — POLYETHYLENE GLYCOL 3350 17 G PO PACK
17.0000 g | PACK | Freq: Every day | ORAL | Status: DC
  Administered 2012-10-12 – 2012-10-14 (×3): 17 g via ORAL
  Filled 2012-10-12 (×3): qty 1

## 2012-10-12 MED ORDER — GLYCOPYRROLATE 0.2 MG/ML IJ SOLN
INTRAMUSCULAR | Status: DC | PRN
  Administered 2012-10-12 (×2): .3 mg via INTRAVENOUS

## 2012-10-12 MED ORDER — ROCURONIUM BROMIDE 100 MG/10ML IV SOLN
INTRAVENOUS | Status: DC | PRN
  Administered 2012-10-12: 10 mg via INTRAVENOUS
  Administered 2012-10-12: 30 mg via INTRAVENOUS
  Administered 2012-10-12: 10 mg via INTRAVENOUS

## 2012-10-12 MED ORDER — ARTIFICIAL TEARS OP OINT
TOPICAL_OINTMENT | OPHTHALMIC | Status: DC | PRN
  Administered 2012-10-12: 1 via OPHTHALMIC

## 2012-10-12 MED ORDER — METOCLOPRAMIDE HCL 10 MG PO TABS: 5.0000 mg | ORAL_TABLET | Freq: Three times a day (TID) | ORAL | Status: DC | PRN

## 2012-10-12 MED ORDER — OXYCODONE HCL 5 MG PO TABS
5.0000 mg | ORAL_TABLET | ORAL | Status: DC | PRN
  Administered 2012-10-12 – 2012-10-13 (×4): 10 mg via ORAL
  Filled 2012-10-12 (×4): qty 2

## 2012-10-12 MED ORDER — SUCCINYLCHOLINE CHLORIDE 20 MG/ML IJ SOLN
INTRAMUSCULAR | Status: DC | PRN
  Administered 2012-10-12: 100 mg via INTRAVENOUS

## 2012-10-12 MED ORDER — ONDANSETRON HCL 4 MG/2ML IJ SOLN: 4.0000 mg | Freq: Four times a day (QID) | INTRAMUSCULAR | Status: DC | PRN

## 2012-10-12 MED ORDER — METOCLOPRAMIDE HCL 5 MG/ML IJ SOLN: 5.0000 mg | Freq: Three times a day (TID) | INTRAMUSCULAR | Status: DC | PRN

## 2012-10-12 MED ORDER — NEOSTIGMINE METHYLSULFATE 1 MG/ML IJ SOLN
INTRAMUSCULAR | Status: DC | PRN
  Administered 2012-10-12 (×2): 2 mg via INTRAVENOUS

## 2012-10-12 MED ORDER — DOCUSATE SODIUM 100 MG PO CAPS
100.0000 mg | ORAL_CAPSULE | Freq: Two times a day (BID) | ORAL | Status: DC
  Administered 2012-10-12 – 2012-10-14 (×4): 100 mg via ORAL
  Filled 2012-10-12 (×4): qty 1

## 2012-10-12 MED ORDER — FLEET ENEMA 7-19 GM/118ML RE ENEM: 1.0000 | ENEMA | Freq: Once | RECTAL | Status: AC | PRN

## 2012-10-12 MED ORDER — ONDANSETRON HCL 4 MG PO TABS: 4.0000 mg | ORAL_TABLET | Freq: Four times a day (QID) | ORAL | Status: DC | PRN

## 2012-10-12 MED ORDER — DIPHENHYDRAMINE HCL 12.5 MG/5ML PO ELIX: 12.5000 mg | ORAL_SOLUTION | ORAL | Status: DC | PRN

## 2012-10-12 MED ORDER — BUPIVACAINE HCL (PF) 0.5 % IJ SOLN
INTRAMUSCULAR | Status: DC | PRN
  Administered 2012-10-12: 30 mL

## 2012-10-12 MED ORDER — ENOXAPARIN SODIUM 40 MG/0.4ML ~~LOC~~ SOLN
40.0000 mg | SUBCUTANEOUS | Status: DC
  Administered 2012-10-13 – 2012-10-14 (×2): 40 mg via SUBCUTANEOUS
  Filled 2012-10-12 (×3): qty 0.4

## 2012-10-12 MED ORDER — VITAMIN D 50 MCG (2000 UT) PO TABS: 2000.0000 [IU] | ORAL_TABLET | Freq: Every day | ORAL | Status: DC

## 2012-10-12 MED ORDER — FENTANYL CITRATE 0.05 MG/ML IJ SOLN
INTRAMUSCULAR | Status: DC | PRN
  Administered 2012-10-12: 50 ug via INTRAVENOUS
  Administered 2012-10-12: 100 ug via INTRAVENOUS
  Administered 2012-10-12: 50 ug via INTRAVENOUS

## 2012-10-12 MED ORDER — VITAMIN D3 25 MCG (1000 UNIT) PO TABS
2000.0000 [IU] | ORAL_TABLET | Freq: Every day | ORAL | Status: DC
  Administered 2012-10-12 – 2012-10-14 (×3): 2000 [IU] via ORAL
  Filled 2012-10-12 (×3): qty 2

## 2012-10-12 MED ORDER — LACTATED RINGERS IV SOLN
INTRAVENOUS | Status: DC | PRN
  Administered 2012-10-12 (×2): via INTRAVENOUS

## 2012-10-12 MED ORDER — ONDANSETRON HCL 4 MG/2ML IJ SOLN
INTRAMUSCULAR | Status: DC | PRN
  Administered 2012-10-12: 4 mg via INTRAVENOUS

## 2012-10-12 MED ORDER — 0.9 % SODIUM CHLORIDE (POUR BTL) OPTIME
TOPICAL | Status: DC | PRN
  Administered 2012-10-12: 1000 mL

## 2012-10-12 MED ORDER — CEFAZOLIN SODIUM-DEXTROSE 2-3 GM-% IV SOLR
2.0000 g | Freq: Once | INTRAVENOUS | Status: DC
  Filled 2012-10-12: qty 50

## 2012-10-12 MED ORDER — METHOCARBAMOL 500 MG PO TABS
500.0000 mg | ORAL_TABLET | Freq: Four times a day (QID) | ORAL | Status: DC | PRN
  Administered 2012-10-12 – 2012-10-14 (×4): 500 mg via ORAL
  Filled 2012-10-12 (×4): qty 1

## 2012-10-12 MED ORDER — SENNA 8.6 MG PO TABS
1.0000 | ORAL_TABLET | Freq: Two times a day (BID) | ORAL | Status: DC
  Administered 2012-10-12 – 2012-10-14 (×4): 8.6 mg via ORAL
  Filled 2012-10-12 (×5): qty 1

## 2012-10-12 MED ORDER — LIDOCAINE HCL (CARDIAC) 20 MG/ML IV SOLN
INTRAVENOUS | Status: DC | PRN
  Administered 2012-10-12: 90 mg via INTRAVENOUS

## 2012-10-12 MED ORDER — SODIUM CHLORIDE 0.9 % IV SOLN
INTRAVENOUS | Status: DC | PRN
  Administered 2012-10-12: 10:00:00 via INTRAVENOUS

## 2012-10-12 SURGICAL SUPPLY — 85 items
BANDAGE ELASTIC 4 VELCRO ST LF (GAUZE/BANDAGES/DRESSINGS) ×2 IMPLANT
BANDAGE ELASTIC 6 VELCRO ST LF (GAUZE/BANDAGES/DRESSINGS) ×2 IMPLANT
BANDAGE ESMARK 6X9 LF (GAUZE/BANDAGES/DRESSINGS) ×1 IMPLANT
BANDAGE GAUZE ELAST BULKY 4 IN (GAUZE/BANDAGES/DRESSINGS) ×2 IMPLANT
BIT DRILL 100X2.5XANTM LCK (BIT) IMPLANT
BIT DRILL 3.5X5.5 QC CALB (BIT) ×1 IMPLANT
BIT DRILL CAL (BIT) IMPLANT
BIT DRL 100X2.5XANTM LCK (BIT) ×1
BLADE SURG 10 STRL SS (BLADE) ×2 IMPLANT
BLADE SURG 15 STRL LF DISP TIS (BLADE) ×1 IMPLANT
BLADE SURG 15 STRL SS (BLADE) ×2
BLADE SURG ROTATE 9660 (MISCELLANEOUS) IMPLANT
BNDG CMPR 9X6 STRL LF SNTH (GAUZE/BANDAGES/DRESSINGS) ×1
BNDG COHESIVE 4X5 TAN STRL (GAUZE/BANDAGES/DRESSINGS) ×2 IMPLANT
BNDG ESMARK 6X9 LF (GAUZE/BANDAGES/DRESSINGS) ×2
BRUSH SCRUB DISP (MISCELLANEOUS) ×4 IMPLANT
CLOTH BEACON ORANGE TIMEOUT ST (SAFETY) ×2 IMPLANT
COVER MAYO STAND STRL (DRAPES) ×2 IMPLANT
DRAPE C-ARM 42X72 X-RAY (DRAPES) ×2 IMPLANT
DRAPE C-ARMOR (DRAPES) ×2 IMPLANT
DRAPE INCISE IOBAN 66X45 STRL (DRAPES) ×2 IMPLANT
DRAPE ORTHO SPLIT 77X108 STRL (DRAPES)
DRAPE SURG ORHT 6 SPLT 77X108 (DRAPES) IMPLANT
DRAPE U-SHAPE 47X51 STRL (DRAPES) ×2 IMPLANT
DRILL BIT 2.5MM (BIT) ×2
DRILL BIT CAL (BIT) ×2
DRSG ADAPTIC 3X8 NADH LF (GAUZE/BANDAGES/DRESSINGS) ×2 IMPLANT
DRSG PAD ABDOMINAL 8X10 ST (GAUZE/BANDAGES/DRESSINGS) ×5 IMPLANT
ELECT REM PT RETURN 9FT ADLT (ELECTROSURGICAL) ×2
ELECTRODE REM PT RTRN 9FT ADLT (ELECTROSURGICAL) ×1 IMPLANT
EVACUATOR 1/8 PVC DRAIN (DRAIN) IMPLANT
EVACUATOR 3/16  PVC DRAIN (DRAIN)
EVACUATOR 3/16 PVC DRAIN (DRAIN) IMPLANT
GLOVE BIO SURGEON STRL SZ7.5 (GLOVE) ×2 IMPLANT
GLOVE BIO SURGEON STRL SZ8 (GLOVE) ×2 IMPLANT
GLOVE BIOGEL PI IND STRL 7.5 (GLOVE) ×1 IMPLANT
GLOVE BIOGEL PI IND STRL 8 (GLOVE) ×1 IMPLANT
GLOVE BIOGEL PI INDICATOR 7.5 (GLOVE) ×1
GLOVE BIOGEL PI INDICATOR 8 (GLOVE) ×1
GOWN PREVENTION PLUS XLARGE (GOWN DISPOSABLE) ×2 IMPLANT
GOWN STRL NON-REIN LRG LVL3 (GOWN DISPOSABLE) ×4 IMPLANT
IMMOBILIZER KNEE 22 UNIV (SOFTGOODS) ×1 IMPLANT
K-WIRE ACE 1.6X6 (WIRE) ×2
KIT BASIN OR (CUSTOM PROCEDURE TRAY) ×2 IMPLANT
KIT ROOM TURNOVER OR (KITS) ×2 IMPLANT
KWIRE ACE 1.6X6 (WIRE) IMPLANT
MANIFOLD NEPTUNE II (INSTRUMENTS) ×2 IMPLANT
NDL SUT .5 MAYO 1.404X.05X (NEEDLE) IMPLANT
NEEDLE 22X1 1/2 (OR ONLY) (NEEDLE) IMPLANT
NEEDLE MAYO TAPER (NEEDLE)
NS IRRIG 1000ML POUR BTL (IV SOLUTION) ×2 IMPLANT
PACK ORTHO EXTREMITY (CUSTOM PROCEDURE TRAY) ×2 IMPLANT
PAD ARMBOARD 7.5X6 YLW CONV (MISCELLANEOUS) ×4 IMPLANT
PAD CAST 4YDX4 CTTN HI CHSV (CAST SUPPLIES) ×1 IMPLANT
PADDING CAST COTTON 4X4 STRL (CAST SUPPLIES) ×2
PADDING CAST COTTON 6X4 STRL (CAST SUPPLIES) ×2 IMPLANT
PASTE INJ ETEX BETA BSM 10CC (Putty) ×1 IMPLANT
PLATE LOCK 5H STD RT PROX TIB (Plate) ×1 IMPLANT
SCREW CORTICAL 3.5MM  34MM (Screw) ×1 IMPLANT
SCREW CORTICAL 3.5MM 34MM (Screw) IMPLANT
SCREW CORTICAL 3.5MM 36MM (Screw) ×1 IMPLANT
SCREW LOCK CORT STAR 3.5X60 (Screw) ×2 IMPLANT
SCREW LOCK CORT STAR 3.5X65 (Screw) ×1 IMPLANT
SCREW LOCK CORT STAR 3.5X70 (Screw) ×2 IMPLANT
SCREW LP 3.5X75MM (Screw) ×1 IMPLANT
SPONGE GAUZE 4X4 12PLY (GAUZE/BANDAGES/DRESSINGS) ×2 IMPLANT
SPONGE LAP 18X18 X RAY DECT (DISPOSABLE) ×2 IMPLANT
STAPLER VISISTAT 35W (STAPLE) ×2 IMPLANT
STOCKINETTE IMPERVIOUS LG (DRAPES) ×2 IMPLANT
SUCTION FRAZIER TIP 10 FR DISP (SUCTIONS) ×2 IMPLANT
SUT ETHILON 3 0 PS 1 (SUTURE) IMPLANT
SUT PROLENE 0 CT 2 (SUTURE) ×4 IMPLANT
SUT VIC AB 0 CT1 27 (SUTURE) ×2
SUT VIC AB 0 CT1 27XBRD ANBCTR (SUTURE) ×1 IMPLANT
SUT VIC AB 1 CT1 27 (SUTURE) ×2
SUT VIC AB 1 CT1 27XBRD ANBCTR (SUTURE) ×1 IMPLANT
SUT VIC AB 2-0 CT1 27 (SUTURE) ×4
SUT VIC AB 2-0 CT1 TAPERPNT 27 (SUTURE) ×2 IMPLANT
SYR 20ML ECCENTRIC (SYRINGE) IMPLANT
TOWEL OR 17X24 6PK STRL BLUE (TOWEL DISPOSABLE) ×2 IMPLANT
TOWEL OR 17X26 10 PK STRL BLUE (TOWEL DISPOSABLE) ×4 IMPLANT
TRAY FOLEY CATH 14FR (SET/KITS/TRAYS/PACK) IMPLANT
TUBE CONNECTING 12X1/4 (SUCTIONS) ×2 IMPLANT
WATER STERILE IRR 1000ML POUR (IV SOLUTION) ×4 IMPLANT
YANKAUER SUCT BULB TIP NO VENT (SUCTIONS) ×2 IMPLANT

## 2012-10-12 NOTE — Progress Notes (Signed)
Orthopedic Tech Progress Note Patient Details:  [name] 07/20/1951 [id number]  Patient ID: [name], female   DOB: 1951-03-25, 61 y.o.   MRN: [id number] Called [facility] with brace order;spoke with Trey Paula @2020   Okey Dupre, Kandance Yano 10/12/2012, 8:30 PM

## 2012-10-12 NOTE — Brief Op Note (Signed)
10/11/2012 - 10/12/2012  2:24 PM  PATIENT:  [name]  61 y.o. female  PRE-OPERATIVE DIAGNOSIS:  right  tibial plateau fracture  POST-OPERATIVE DIAGNOSIS:  same  PROCEDURE:  Procedure(s) (LRB) with comments: OPEN REDUCTION INTERNAL FIXATION (ORIF) TIBIAL PLATEAU (Right) Anterior compartment fasciotomy  SURGEON:  Surgeon(s) and Role:    * Budd Palmer, MD - Primary  PHYSICIAN ASSISTANT: Montez Morita  ANESTHESIA:   general plus regional  EBL:  Total I/O In: 1850 [I.V.:1850] Out: 625 [Urine:575; Blood:50]  BLOOD ADMINISTERED:none  DRAINS: none   LOCAL MEDICATIONS USED:  Amount: NONE  SPECIMEN:  No Specimen  DISPOSITION OF SPECIMEN:  N/A  COUNTS:  YES  TOURNIQUET:  None  DICTATION: .Other Dictation: Dictation Number [id number]  PLAN OF CARE: Admit to inpatient   PATIENT DISPOSITION:  PACU - hemodynamically stable.   Delay start of Pharmacological VTE agent (>24hrs) due to surgical blood loss or risk of bleeding: no

## 2012-10-12 NOTE — Anesthesia Procedure Notes (Addendum)
Anesthesia Regional Block:  Femoral nerve block  Pre-Anesthetic Checklist: ,, timeout performed, Correct Patient, Correct Site, Correct Laterality, Correct Procedure, Correct Position, site marked, Risks and benefits discussed,  Surgical consent,  Pre-op evaluation,  At surgeon's request and post-op pain management  Laterality: Right and Lower  Prep: chloraprep       Needles:  Injection technique: Single-shot  Needle Type: Echogenic Needle     Needle Length: 9cm  Needle Gauge: 21    Additional Needles:  Procedures: ultrasound guided (picture in chart) Femoral nerve block Narrative:  Start time: 10/12/2012 11:55 AM End time: 10/12/2012 12:05 PM Injection made incrementally with aspirations every 5 mL.  Performed by: Personally  Anesthesiologist: Sheldon Silvan, MD  Femoral nerve block Procedure Name: Intubation Date/Time: 10/12/2012 11:43 AM Performed by: Marni Griffon Pre-anesthesia Checklist: Patient identified, Emergency Drugs available, Suction available and Patient being monitored Patient Re-evaluated:Patient Re-evaluated prior to inductionOxygen Delivery Method: Circle system utilized Preoxygenation: Pre-oxygenation with 100% oxygen Intubation Type: IV induction Ventilation: Mask ventilation without difficulty Laryngoscope Size: Mac and 3 Grade View: Grade I Tube type: Oral Tube size: 7.5 mm Number of attempts: 1 Airway Equipment and Method: Stylet Placement Confirmation: ETT inserted through vocal cords under direct vision,  breath sounds checked- equal and bilateral and positive ETCO2 Secured at: 21 (cm at teeth) cm Tube secured with: Tape (to cheeks)

## 2012-10-12 NOTE — Op Note (Signed)
[name], [name]               ACCOUNT NO.:  [id number]  MEDICAL RECORD NO.:  [id number]  LOCATION:  5N19C                        FACILITY:  [facility]  PHYSICIAN:  Doralee Albino. Carola Frost, M.D. DATE OF BIRTH:  01/23/1951  DATE OF PROCEDURE:  10/12/2012 DATE OF DISCHARGE:                              OPERATIVE REPORT   PREOPERATIVE DIAGNOSIS:  Right lateral tibial plateau fracture.  POSTOPERATIVE DIAGNOSIS:  Right lateral tibial plateau fracture.  PROCEDURE: 1. ORIF of right lateral tibial plateau. 2. Anterior compartment fasciotomy.  SURGEON:  Doralee Albino. Carola Frost, MD  ASSISTANT:  Mearl Latin, PA  ANESTHESIA:  General plus regional.  I/O:  In 1850 mL of crystalloid, out urine 575 mL, EBL 100 mL.  DRAINS:  None.  TOURNIQUET:  None.  DISPOSITION:  To PACU.  CONDITION:  Stable.  BRIEF SUMMARY OF INDICATIONS FOR PROCEDURE:  The patient is a 61 year old female who was at work when she fell and sustained injury to the right lateral tibial plateau.  She was initially seen and evaluated by Dr. Shon Baton, who requested further evaluation and management by the Orthopedic Trauma Service.  We did discuss with the patient the risks and benefits of surgical repair including the possibility of nerve injury, vessel injury, DVT, PE, heart attack, stroke, wound complications, the potential for delayed versus early fixation, and compartment syndrome in the event we did proceed acutely.  She understood these risks and did wish to proceed with surgical repair as soon as feasible to expedite her care and reduce the number of procedures and subsequent admissions.  BRIEF SUMMARY OF PROCEDURE:  Ms. Linnemann received preop antibiotics, taken to the operating room where general anesthesia was induced.  Her right lower extremity was prepped and draped in usual sterile fashion. No tourniquet was used.  A curvilinear incision was made and dissection carried down to the retinaculum which was then incised  superior to the joint line.  The coronary ligament was incised along its insertion onto the tibia and reflected proximally.  The lateral meniscus was examined carefully and did not have any tears or avulsion along the peripheral attachment.  The articular surface was also visualized and had a split depressed fragment running both in the coronal as well as the sagittal plane with the latter being the primary fracture line.  The periosteal attachment was left intact along the brim but the anterior compartment opened distal to this to facilitate plate placement along the lateral edge of the proximal tibia.  After developing the fracture plane and booking open this, I was able to identify the depressed segments and elevate them.  With the help of my assistant, Montez Morita, we were able to apply varus stress while holding the depressed segments, elevated adjacent to the tibial eminence.  This then allowed placement of K-wires that were brought out through the medial side and retracted from the lateral side.  The lateral compartment, peripheral segment which had been booked open was then closed and elevated as well and fixed provisionally with K-wires.  The plate was then fixed along the lateral side and with the assistance of additional K-wires checked for position and alignment.  The [employer] clamp was used  to achieve compression as well as a lag screw placed to the level of the joint.  This was followed by 2 screws distally and then an additional 5 screws proximally.  Final images showed appropriate restoration of the joint surface with excellent alignment, articular congruity, restoration of condylar width, and several placement of hardware.  The long Metzenbaum scissors were then spread on both the superficial and deep aspects of the anterior compartment fascia and a fasciotomy performed for the length of the compartment with the scissor tips pointed away from the superficial peroneal  nerve.  Final images were obtained.  The coronary ligament repaired with vertical mattress 0 Prolene and then #1 Vicryl for the arthrotomy, 2-0 Vicryl and 3-0 nylon for the subcu and skin.  Montez Morita did assist with the entirety of the case including again achieving reduction, placement, removal of provisional and definitive fixation and wound closure.  PROGNOSIS:  Ms. Dahmer will be nonweightbearing with unlimited range of motion of the right knee.  Her body habitus may preclude good fit of a hinged brace and if so this will likely be deferred given her stable ligamentous examination following fixation.  She will be on DVT prophylaxis with Lovenox and this is anticipated to continue for 10 days postop.  Social work may be difficult given her size and the patient's psoriatic arthritis which does limit his physical capacity.  Her bone quality also appeared to have been impaired and we will recommend further workup in that regard.     Doralee Albino. Carola Frost, M.D.     MHH/MEDQ  D:  10/12/2012  T:  10/12/2012  Job:  [id number]

## 2012-10-12 NOTE — Preoperative (Signed)
Beta Blockers   Reason not to administer Beta Blockers:Not Applicable 

## 2012-10-12 NOTE — Transfer of Care (Signed)
Immediate Anesthesia Transfer of Care Note  Patient: [name]  Procedure(s) Performed: Procedure(s) (LRB) with comments: OPEN REDUCTION INTERNAL FIXATION (ORIF) TIBIAL PLATEAU (Right)  Patient Location: PACU  Anesthesia Type:General  Level of Consciousness: awake, alert , oriented and patient cooperative  Airway & Oxygen Therapy: Patient Spontanous Breathing and Patient connected to nasal cannula oxygen  Post-op Assessment: Report given to PACU RN, Post -op Vital signs reviewed and stable and Patient moving all extremities  Post vital signs: Reviewed and stable  Complications: No apparent anesthesia complications

## 2012-10-12 NOTE — Consult Note (Signed)
Orthopaedic Trauma Service Consult   Reason for Consult: Right tibial plateau fracture Referring Physician: D. Shon Baton, MD   HPI: Patient is a 61 year old Caucasian female who is at work yesterday when she tripped. Patient landed on her right knee. Patient attempted to grab hold of a conveyor belt to hold her up but this was unsuccessful. Patient was unable to get up and ambulate. She was brought [facility] evaluation where she was found to have a right lateral tibial plateau fracture. Patient was then transferred to [facility] for admission for observation as well as for consultation by the orthopedic trauma service. Currently patient is in 5 N. room 19 she is in bed and resting comfortably. She denies any numbness or tingling in her right leg. Denies any injuries elsewhere. No headaches, dizziness, lightheadedness, shortness of breath, chest pain, palpitations, abdominal pain. Patient is also about one year status post repair of a complex patella fracture after a fall as well. This was on the right side as well   Past Medical History  Diagnosis Date  . Diabetes mellitus   . Hypertension   . Dyslipidemia   . Obesity     Past Surgical History  Procedure Date  . Total abdominal hysterectomy w/ bilateral salpingoophorectomy     fibroids  . Fractured patella repair 02/2011    Dr. Thomasena Edis; R knee    No family history on file.  Social History:  reports that she has never smoked. She has never used smokeless tobacco. She reports that she does not drink alcohol or use illicit drugs.  Allergies: No Known Allergies  Medications:  I have reviewed the patient's current medications. Prior to Admission:  Prescriptions prior to admission  Medication Sig Dispense Refill  . aspirin EC 81 MG tablet Take 81 mg by mouth daily.      Marland Kitchen atorvastatin (LIPITOR) 10 MG tablet Take 1 tablet (10 mg total) by mouth daily.  90 tablet  3  . calcium carbonate (OS-CAL) 600 MG TABS Take 600 mg  by mouth 2 (two) times daily with a meal.        . Cholecalciferol (VITAMIN D) 2000 UNITS tablet Take 2,000 Units by mouth daily.      Marland Kitchen KRILL OIL 1000 MG CAPS Take 1,000 mg by mouth every evening.      Marland Kitchen lisinopril-hydrochlorothiazide (PRINZIDE,ZESTORETIC) 20-12.5 MG per tablet Take 1 tablet by mouth daily.  90 tablet  3  . Multiple Vitamins-Minerals (MULTIVITAMIN WITH MINERALS) tablet Take 1 tablet by mouth daily.       . pioglitazone (ACTOS) 30 MG tablet Take 1 tablet (30 mg total) by mouth daily.  90 tablet  1  . glucose blood test strip USE AS DIRECTED  100 each  PRN  . ONE TOUCH LANCETS MISC USE AS DIRECTED  200 each  PRN   Scheduled:   . aspirin EC  81 mg Oral Daily  . atorvastatin  10 mg Oral Daily  .  ceFAZolin (ANCEF) IV  2 g Intravenous Once  .  ceFAZolin (ANCEF) IV  2 g Intravenous Once  . lisinopril  20 mg Oral Daily   And  . hydrochlorothiazide  12.5 mg Oral Daily  . insulin aspart  0-15 Units Subcutaneous TID WC   Continuous:  WJX:[id number], acetaminophen, morphine injection, ondansetron, ondansetron (ZOFRAN) IV, ondansetron  Results for orders placed during the hospital encounter of 10/11/12 (from the past 48 hour(s))  BASIC METABOLIC PANEL     Status: Abnormal  Collection Time   10/11/12  9:07 PM      Component Value Range Comment   Sodium 140  135 - 145 mEq/L    Potassium 3.6  3.5 - 5.1 mEq/L    Chloride 100  96 - 112 mEq/L    CO2 29  19 - 32 mEq/L    Glucose, Bld 112 (*) 70 - 99 mg/dL    BUN 14  6 - 23 mg/dL    Creatinine, Ser 1.61  0.50 - 1.10 mg/dL    Calcium 9.8  8.4 - 09.6 mg/dL    GFR calc non Af Amer >90  >90 mL/min    GFR calc Af Amer >90  >90 mL/min   CBC     Status: Normal   Collection Time   10/11/12  9:07 PM      Component Value Range Comment   WBC 6.7  4.0 - 10.5 K/uL    RBC 3.89  3.87 - 5.11 MIL/uL    Hemoglobin 12.0  12.0 - 15.0 g/dL    HCT 04.5  40.9 - 81.1 %    MCV 93.3  78.0 - 100.0 fL    MCH 30.8  26.0 - 34.0 pg    MCHC 33.1   30.0 - 36.0 g/dL    RDW 91.4  78.2 - 95.6 %    Platelets 206  150 - 400 K/uL   HEMOGLOBIN A1C     Status: Abnormal   Collection Time   10/11/12  9:07 PM      Component Value Range Comment   Hemoglobin A1C 6.6 (*) <5.7 %    Mean Plasma Glucose 143 (*) <117 mg/dL   PROTIME-INR     Status: Normal   Collection Time   10/11/12  9:07 PM      Component Value Range Comment   Prothrombin Time 13.3  11.6 - 15.2 seconds    INR 1.02  0.00 - 1.49   GLUCOSE, CAPILLARY     Status: Abnormal   Collection Time   10/11/12 10:22 PM      Component Value Range Comment   Glucose-Capillary 161 (*) 70 - 99 mg/dL   URINALYSIS, ROUTINE W REFLEX MICROSCOPIC     Status: Normal   Collection Time   10/11/12 10:33 PM      Component Value Range Comment   Color, Urine YELLOW  YELLOW    APPearance CLEAR  CLEAR    Specific Gravity, Urine 1.010  1.005 - 1.030    pH 7.0  5.0 - 8.0    Glucose, UA NEGATIVE  NEGATIVE mg/dL    Hgb urine dipstick NEGATIVE  NEGATIVE    Bilirubin Urine NEGATIVE  NEGATIVE    Ketones, ur NEGATIVE  NEGATIVE mg/dL    Protein, ur NEGATIVE  NEGATIVE mg/dL    Urobilinogen, UA 0.2  0.0 - 1.0 mg/dL    Nitrite NEGATIVE  NEGATIVE    Leukocytes, UA NEGATIVE  NEGATIVE MICROSCOPIC NOT DONE ON URINES WITH NEGATIVE PROTEIN, BLOOD, LEUKOCYTES, NITRITE, OR GLUCOSE <1000 mg/dL.  GLUCOSE, CAPILLARY     Status: Abnormal   Collection Time   10/12/12  3:24 AM      Component Value Range Comment   Glucose-Capillary 165 (*) 70 - 99 mg/dL   SURGICAL PCR SCREEN     Status: Normal   Collection Time   10/12/12  3:35 AM      Component Value Range Comment   MRSA, PCR NEGATIVE  NEGATIVE    Staphylococcus aureus NEGATIVE  NEGATIVE   GLUCOSE, CAPILLARY     Status: Abnormal   Collection Time   10/12/12  8:35 AM      Component Value Range Comment   Glucose-Capillary 129 (*) 70 - 99 mg/dL     Dg Tibia/fibula Right  10/11/2012  *RADIOLOGY REPORT*  Clinical Data: Larey Seat at work, pain from right to knee to right  ankle  RIGHT TIBIA AND FIBULA - 2 VIEW  Comparison: Right knee radiographs 10/11/2012  Findings: Osseous demineralization. Again identified depressed and minimally displaced intra-articular fracture of the lateral tibial plateau. Remainder of the right tibia and fibula appear intact. Ankle joint spaces preserved. No additional fracture or dislocation. Plantar and Achilles insertion calcaneal spurs. No definite soft tissue abnormalities.  IMPRESSION: Depressed and minimally displaced intra-articular fracture of the lateral tibial plateau.   Original Report Authenticated By: Ulyses Southward, M.D.    Dg Ankle Complete Right  10/11/2012  *RADIOLOGY REPORT*  Clinical Data: Larey Seat at work, pain for right medial right ankle  RIGHT ANKLE - COMPLETE 3+ VIEW  Comparison: None  Findings: Osseous demineralization. Ankle mortise intact. No acute fracture, dislocation or bone destruction. Plantar and Achilles insertion calcaneal spur formation.  IMPRESSION: Calcaneal spurring. Osseous demineralization. No acute abnormalities; please refer to the right knee radiograph report for discussion of a lateral tibial plateau fracture.   Original Report Authenticated By: Ulyses Southward, M.D.    Dg Chest Port 1 View  10/11/2012  *RADIOLOGY REPORT*  Clinical Data: Preoperative radiograph  PORTABLE CHEST - 1 VIEW  Comparison: 03/22/2011  Findings: Heart size upper normal to mildly enlarged, similar to prior.  Mediastinal contours otherwise within normal range.  No pleural effusion or pneumothorax.  No confluent airspace opacity. No acute osseous finding.  IMPRESSION: Heart size upper normal to mildly enlarged.  No acute process identified.   Original Report Authenticated By: Jearld Lesch, M.D.    Dg Knee Complete 4 Views Right  10/11/2012  *RADIOLOGY REPORT*  Clinical Data: Right knee injury, pain post fall, unable to bear weight  RIGHT KNEE - COMPLETE 4+ VIEW  Comparison: 07/18/1999  Findings: Osseous demineralization. Mildly depressed  and displaced fracture of the lateral tibial plateau. Minimal knee joint effusion. Old post-traumatic deformity of prior patellar fracture. No additional acute fracture or dislocation. Calcifications are seen just anterior to the distal quadriceps tendon, new, question sequela of prior injury.  IMPRESSION: Mildly depressed and displaced fracture involving the lateral tibial plateau. Osseous demineralization. Old healed post-traumatic deformity of the patella.   Original Report Authenticated By: Ulyses Southward, M.D.     Review of Systems  Constitutional: Negative for fever and chills.  Eyes: Negative for blurred vision.  Respiratory: Negative for shortness of breath and wheezing.   Cardiovascular: Negative for chest pain and palpitations.  Gastrointestinal: Positive for nausea. Negative for vomiting and abdominal pain.  Genitourinary: Negative for dysuria.  Musculoskeletal:       Right knee pain  Neurological: Negative for dizziness, tingling and headaches.   Blood pressure 116/50, pulse 70, temperature 98.5 F (36.9 C), temperature source Oral, resp. rate 16, height 5\' 4"  (1.626 m), weight 98.4 kg (216 lb 14.9 oz), SpO2 93.00%. Physical Exam  Constitutional: She is oriented to person, place, and time. Vital signs are normal. She appears well-developed and well-nourished. She is cooperative. She does not appear ill. No distress.  HENT:  Head: Normocephalic and atraumatic.  Mouth/Throat: Mucous membranes are normal.  Eyes: EOM are normal.  Neck: Normal range of motion and full passive  range of motion without pain. Neck supple. No spinous process tenderness and no muscular tenderness present. Normal range of motion present.  Cardiovascular: Normal rate, regular rhythm, S1 normal and S2 normal.   Respiratory:       Clear to auscultation bilaterally  GI:       Soft, nontender,+ bowel sounds  Musculoskeletal:       Bilateral upper extremities and left lower extremity are unremarkable. Motor and  sensory functions are grossly intact palpable pulses  Right lower extremity   Patient in a knee immobilizer   Upon removal of the knee immobilizer skin is stable. No open wounds or lesions noted. Old anterior scar from previous patellar. Noted well-healed   Swelling is stable   Skin wrinkles with gentle compression along the lateral aspect of the proximal tibia   Deep peroneal nerve, superficial peroneal nerve into the nerve sensory functions are grossly intact   EHL, FHL, anterior tibialis, posterior tibialis, peroneals and gastrocsoleus complex motor function are intact   Palpable dorsalis pedis pulses noted  No pain with passive stretch. Compartments are supple, soft and nontender    Right knee is unstable with valgus stress   Neurological: She is alert and oriented to person, place, and time. No sensory deficit.  Psychiatric: She has a normal mood and affect. Her speech is normal and behavior is normal. Judgment and thought content normal. Cognition and memory are normal.    Assessment/Plan:  61 year old female status post fall, mechanical  1. Fall, mechanical 2. right split depression type lateral tibial plateau fracture, Schatzker 2, OTA 41-B3  We will obtain a CT scan to further characterize fracture pattern for surgical planning  We do plan to proceed to the OR later on this afternoon for formal ORIF of this fracture.  Patient will be nonweightbearing for 6 weeks after fixation and will have unrestricted range of motion of her right knee.  we will get her in a hinged knee brace to surgery  Continue with ice and elevation  PT/OT consults after surgery  Would anticipate patient discharge to home with home health when she is able to do so and is mobilizing safely.  3. hypertension and diabetes  Will hold on oral hypoglycemic agents. Start patient on SSI  Monitor blood pressures may need to hold some of her pressure medications 4. metabolic bone workup  Will check a vitamin D  panel while patient is here evaluate for any additional causes for a poor bone density. This is likely a primary osteoporosis/osteopenia again patient's age. 5. Pain  Patient have some issues in the past and pain medication resulting in sounds to be a bowel obstruction. We will monitor her narcotic use closely and also make sure she is on a good bowel regimen. 6. DVT/PE prophylaxis  Lovenox postop for 21 days 7. FEN  N.p.o. 8. Disposition  OR today  Mearl Latin, PA-C [facility] [phone] (P)  10/12/2012, 9:17 AM

## 2012-10-12 NOTE — Anesthesia Preprocedure Evaluation (Addendum)
Anesthesia Evaluation  Patient identified by MRN, date of birth, ID band Patient awake    Reviewed: Allergy & Precautions, H&P , NPO status , Patient's Chart, lab work & pertinent test results, reviewed documented beta blocker date and time   Airway Mallampati: II TM Distance: <3 FB Neck ROM: Full    Dental  (+) Teeth Intact and Dental Advisory Given   Pulmonary          Cardiovascular hypertension, Pt. on medications     Neuro/Psych    GI/Hepatic   Endo/Other  diabetes, Well Controlled, Type 2, Oral Hypoglycemic Agents  Renal/GU      Musculoskeletal   Abdominal   Peds  Hematology   Anesthesia Other Findings Narrow upper palate  Reproductive/Obstetrics                         Anesthesia Physical Anesthesia Plan  ASA: II  Anesthesia Plan: General   Post-op Pain Management:    Induction: Intravenous  Airway Management Planned: Oral ETT  Additional Equipment:   Intra-op Plan:   Post-operative Plan: Extubation in OR  Informed Consent: I have reviewed the patients History and Physical, chart, labs and discussed the procedure including the risks, benefits and alternatives for the proposed anesthesia with the patient or authorized representative who has indicated his/her understanding and acceptance.   Dental advisory given  Plan Discussed with: CRNA, Anesthesiologist and Surgeon  Anesthesia Plan Comments:         Anesthesia Quick Evaluation

## 2012-10-12 NOTE — Anesthesia Postprocedure Evaluation (Signed)
  Anesthesia Post-op Note  Patient: [name]  Procedure(s) Performed: Procedure(s) (LRB) with comments: OPEN REDUCTION INTERNAL FIXATION (ORIF) TIBIAL PLATEAU (Right)  Patient Location: PACU  Anesthesia Type:GA combined with regional for post-op pain  Level of Consciousness: awake, alert  and oriented  Airway and Oxygen Therapy: Patient Spontanous Breathing and Patient connected to nasal cannula oxygen  Post-op Pain: none  Post-op Assessment: Post-op Vital signs reviewed  Post-op Vital Signs: Reviewed  Complications: No apparent anesthesia complications

## 2012-10-12 NOTE — Progress Notes (Signed)
Orthopedic Tech Progress Note Patient Details:  [name] 09-03-1951 [id number]  Patient ID: [name], female   DOB: 1951-06-03, 61 y.o.   MRN: [id number] Trapeze bar patient helper  Nikki Dom 10/12/2012, 8:29 PM

## 2012-10-13 ENCOUNTER — Encounter (HOSPITAL_COMMUNITY): Payer: Self-pay | Admitting: Orthopedic Surgery

## 2012-10-13 LAB — BASIC METABOLIC PANEL
BUN: 8 mg/dL (ref 6–23)
CO2: 29 mEq/L (ref 19–32)
Calcium: 9.2 mg/dL (ref 8.4–10.5)
Creatinine, Ser: 0.65 mg/dL (ref 0.50–1.10)

## 2012-10-13 LAB — CBC
HCT: 30 % — ABNORMAL LOW (ref 36.0–46.0)
MCH: 30.6 pg (ref 26.0–34.0)
MCV: 92.6 fL (ref 78.0–100.0)
Platelets: 181 10*3/uL (ref 150–400)
RBC: 3.24 MIL/uL — ABNORMAL LOW (ref 3.87–5.11)

## 2012-10-13 LAB — GLUCOSE, CAPILLARY
Glucose-Capillary: 127 mg/dL — ABNORMAL HIGH (ref 70–99)
Glucose-Capillary: 147 mg/dL — ABNORMAL HIGH (ref 70–99)

## 2012-10-13 LAB — VITAMIN D 25 HYDROXY (VIT D DEFICIENCY, FRACTURES): Vit D, 25-Hydroxy: 46 ng/mL (ref 30–89)

## 2012-10-13 MED ORDER — ENOXAPARIN SODIUM 40 MG/0.4ML ~~LOC~~ SOLN: 40.0000 mg | SUBCUTANEOUS | Status: DC

## 2012-10-13 MED ORDER — OXYCODONE HCL 5 MG PO TABS: 5.0000 mg | ORAL_TABLET | ORAL | Status: DC | PRN

## 2012-10-13 MED ORDER — METHOCARBAMOL 500 MG PO TABS: 500.0000 mg | ORAL_TABLET | Freq: Four times a day (QID) | ORAL | Status: DC | PRN

## 2012-10-13 MED ORDER — POLYETHYLENE GLYCOL 3350 17 G PO PACK: 17.0000 g | PACK | Freq: Every day | ORAL | Status: DC

## 2012-10-13 MED ORDER — ASPIRIN EC 81 MG PO TBEC
81.0000 mg | DELAYED_RELEASE_TABLET | Freq: Every day | ORAL | Status: DC
  Administered 2012-10-13 – 2012-10-14 (×2): 81 mg via ORAL
  Filled 2012-10-13 (×2): qty 1

## 2012-10-13 MED ORDER — ACETAMINOPHEN 325 MG PO TABS
325.0000 mg | ORAL_TABLET | Freq: Four times a day (QID) | ORAL | Status: DC | PRN
  Filled 2012-10-13: qty 2

## 2012-10-13 MED ORDER — OXYCODONE-ACETAMINOPHEN 5-325 MG PO TABS
1.0000 | ORAL_TABLET | Freq: Four times a day (QID) | ORAL | Status: DC | PRN
  Administered 2012-10-13 – 2012-10-14 (×2): 2 via ORAL
  Filled 2012-10-13 (×2): qty 2

## 2012-10-13 MED ORDER — OXYCODONE-ACETAMINOPHEN 5-325 MG PO TABS: 1.0000 | ORAL_TABLET | Freq: Four times a day (QID) | ORAL | Status: DC | PRN

## 2012-10-13 MED ORDER — ACETAMINOPHEN 325 MG PO TABS: 325.0000 mg | ORAL_TABLET | Freq: Four times a day (QID) | ORAL | Status: DC | PRN

## 2012-10-13 MED ORDER — DSS 100 MG PO CAPS: 100.0000 mg | ORAL_CAPSULE | Freq: Two times a day (BID) | ORAL | Status: DC

## 2012-10-13 NOTE — Evaluation (Signed)
Physical Therapy Evaluation Patient Details Name: [name] MRN: [id number] DOB: Sep 30, 1951 Today's Date: 10/13/2012 Time: 4098-1191 PT Time Calculation (min): 19 min  PT Assessment / Plan / Recommendation Clinical Impression  Patient is a 61 y.o. female admitted s/p fall and right tibial plateau fracture s/p ORIF and presents with acute pain, decreased ROM/strength, decreased balance and limited activity tolerance limiting independence and safety with mobility and will benefit from skilled PT to maximize independence for safe d/c home with spouse assist and HHPT.    PT Assessment  Patient needs continued PT services    Follow Up Recommendations  Home health PT;Supervision/Assistance - 24 hour          Barriers to Discharge Inaccessible home environment to have ramp built Thursday,  wishes to have ambulance transport home.    Equipment Recommendations  None recommended by PT (Pt. to check on wheelchair for outings (i.e. MD visits))    Recommendations for Other Services  social work or case management for setting up ambulance transport home   Frequency Min 5X/week    Precautions / Restrictions Precautions Precautions: Fall Required Braces or Orthoses: Other Brace/Splint Other Brace/Splint: right knee bledsoe brace with open knee Restrictions RLE Weight Bearing: Non weight bearing   Pertinent Vitals/Pain 3/10 right knee prior to activity; 5/10 after (ice applied and elevation)      Mobility  Bed Mobility Details for Bed Mobility Assistance: patient out of bed in chair upon arrival Transfers Transfers: Sit to Stand;Stand to Sit Sit to Stand: 4: Min assist;With upper extremity assist;With armrests Stand to Sit: With armrests;With upper extremity assist;4: Min assist Details for Transfer Assistance: cues for right LE management Ambulation/Gait Ambulation/Gait Assistance: 4: Min guard Ambulation Distance (Feet): 40 Feet Assistive device: Rolling  walker Ambulation/Gait Assistance Details: cues for forward gaze         Exercises General Exercises - Lower Extremity Ankle Circles/Pumps: AROM;Both;5 reps;Seated Quad Sets: AROM;Both;5 reps;Seated Heel Slides: AAROM;Right;5 reps;Seated Hip ABduction/ADduction: AAROM;Right;5 reps;Seated Other Exercises Other Exercises: Hamstring sets seated x 5 reps  with 5 second hold   PT Diagnosis: Abnormality of gait;Difficulty walking;Acute pain  PT Problem List: Decreased strength;Decreased range of motion;Decreased activity tolerance;Decreased balance;Decreased mobility;Pain PT Treatment Interventions: DME instruction;Gait training;Functional mobility training;Therapeutic activities;Therapeutic exercise;Balance training;Patient/family education   PT Goals Acute Rehab PT Goals PT Goal Formulation: With patient/family Time For Goal Achievement: 10/20/12 Potential to Achieve Goals: Good Pt will go Supine/Side to Sit: with modified independence PT Goal: Supine/Side to Sit - Progress: Goal set today Pt will go Sit to Supine/Side: with modified independence PT Goal: Sit to Supine/Side - Progress: Goal set today Pt will go Sit to Stand: with modified independence PT Goal: Sit to Stand - Progress: Goal set today Pt will go Stand to Sit: with modified independence PT Goal: Stand to Sit - Progress: Goal set today Pt will Stand: with modified independence;3 - 5 min;with bilateral upper extremity support (for ADL's) PT Goal: Stand - Progress: Goal set today Pt will Ambulate: 16 - 50 feet;with supervision;with rolling walker PT Goal: Ambulate - Progress: Goal set today Pt will Perform Home Exercise Program: with supervision, verbal cues required/provided PT Goal: Perform Home Exercise Program - Progress: Goal set today  Visit Information  Last PT Received On: 10/13/12 Assistance Needed: +1    Subjective Data  Subjective: Have done this before when I broke my patella Patient Stated Goal: To  return to independent   Prior Functioning  Home Living Lives With: Spouse Type of Home:  House Home Access:  (to have ramp built on 26th) Entrance Stairs-Number of Steps: 3 Entrance Stairs-Rails: Left Home Layout: One level Bathroom Shower/Tub: Tub/shower unit;Curtain Bathroom Toilet: Standard Home Adaptive Equipment: Bedside commode/3-in-1;Shower chair with back;Walker - rolling;Crutches Prior Function Level of Independence: Independent Able to Take Stairs?: Reciprically Driving: Yes Vocation: 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 Communication Communication: No difficulties Dominant Hand: Right    Cognition  Overall Cognitive Status: Appears within functional limits for tasks assessed/performed Arousal/Alertness: Awake/alert Orientation Level: Appears intact for tasks assessed Behavior During Session: [facility] for tasks performed    Extremity/Trunk Assessment Right Upper Extremity Assessment RUE ROM/Strength/Tone: Within functional levels RUE Sensation: WFL - Light Touch Left Upper Extremity Assessment LUE ROM/Strength/Tone: Within functional levels LUE Sensation: WFL - Light Touch Right Lower Extremity Assessment RLE ROM/Strength/Tone: Deficits;Due to pain RLE ROM/Strength/Tone Deficits: able to perform both quad and hamstring sets, has AAROM knee flexion approx 25 degrees with pain, wiggles toes and ankle with limited motion due to pain RLE Sensation: WFL - Light Touch Left Lower Extremity Assessment LLE ROM/Strength/Tone: WFL for tasks assessed LLE Sensation: WFL - Light Touch   Balance Static Standing Balance Static Standing - Balance Support: Bilateral upper extremity supported Static Standing - Level of Assistance: 5: Stand by assistance Static Standing - Comment/# of Minutes: stood momentarily with RW with supervision prior to ambulation  End of Session PT - End of Session Equipment Utilized During Treatment: Gait  belt Activity Tolerance: Patient tolerated treatment well Patient left: in chair;with call bell/phone within reach;with family/visitor present  GP     [facility] 10/13/2012, 10:07 AM Sheran Lawless, PT [phone] 10/13/2012

## 2012-10-13 NOTE — Evaluation (Signed)
Occupational Therapy Evaluation Patient Details Name: [name] MRN: [id number] DOB: 08/17/1951 Today's Date: 10/13/2012 Time: 4696-2952 OT Time Calculation (min): 29 min  OT Assessment / Plan / Recommendation Clinical Impression  61 yo female s/p fall with ORIF tibia plateau that could benefit from skilled OT acutely. Recommend HHOt for d/c planning    OT Assessment  Patient needs continued OT Services    Follow Up Recommendations  Home health OT    Barriers to Discharge      Equipment Recommendations  None recommended by OT    Recommendations for Other Services    Frequency  Min 2X/week    Precautions / Restrictions Precautions Precautions: Fall Required Braces or Orthoses: Other Brace/Splint Other Brace/Splint: right knee bledsoe brace with open knee Restrictions RLE Weight Bearing: Non weight bearing   Pertinent Vitals/Pain No pain reported at this time RN Sarah arrived to disconnect IV for therapy during session    ADL  Eating/Feeding: Modified independent Where Assessed - Eating/Feeding: Chair Grooming: Teeth care;Modified independent Where Assessed - Grooming: Supported sitting Toilet Transfer: Minimal Dentist Method: Surveyor, minerals: Raised toilet seat with arms (or 3-in-1 over toilet) (transfer to the left side) Toileting - Clothing Manipulation and Hygiene: Maximal assistance Where Assessed - Engineer, mining and Hygiene: Sit to stand from 3-in-1 or toilet Equipment Used: Rolling walker;Gait belt Transfers/Ambulation Related to ADLs: pt educated on hand placement with RW for safety Pt transfered to the left side to stand pivot  ADL Comments: Pt using bed rail to (A) with bed mobility. Pt sitting EOB supervision level. pt required (A) to elevate Rt LE while using 3n1. Pt will need educated on toilet aid due to inability to perform LB peri care.    OT Diagnosis: Generalized weakness;Acute pain  OT  Problem List: Decreased strength;Impaired balance (sitting and/or standing);Decreased activity tolerance;Decreased safety awareness;Decreased knowledge of use of DME or AE;Decreased knowledge of precautions;Pain OT Treatment Interventions: Self-care/ADL training;DME and/or AE instruction;Therapeutic activities;Patient/family education;Balance training   OT Goals Acute Rehab OT Goals OT Goal Formulation: With patient Time For Goal Achievement: 10/27/12 Potential to Achieve Goals: Good ADL Goals Pt Will Perform Grooming: with modified independence;Standing at sink ADL Goal: Grooming - Progress: Goal set today Pt Will Transfer to Toilet: with modified independence;Ambulation;3-in-1 ADL Goal: Toilet Transfer - Progress: Goal set today Pt Will Perform Toileting - Clothing Manipulation: with modified independence;Sitting on 3-in-1 or toilet;with adaptive equipment ADL Goal: Toileting - Clothing Manipulation - Progress: Goal set today Miscellaneous OT Goals Miscellaneous OT Goal #1: Pt will complete bed mobility mod I without bed rails and hOB flat as precursor to adls OT Goal: Miscellaneous Goal #1 - Progress: Goal set today  Visit Information  Last OT Received On: 10/13/12 Assistance Needed: +1    Subjective Data  Subjective: "I hurt this leg last year so it's always been the bad one"- pt with previous rt knee injury Patient Stated Goal: to go home soon   Prior Functioning     Home Living Lives With: Spouse Type of Home: House Home Access:  (to have ramp built on 26[address]) Entrance Stairs-Number of Steps: 3 Entrance Stairs-Rails: Left Home Layout: One level Bathroom Shower/Tub: Forensic scientist: Standard Home Adaptive Equipment: Bedside commode/3-in-1;Shower chair with back;Walker - rolling;Crutches Prior Function Level of Independence: Independent Able to Take Stairs?: Reciprically Driving: Yes 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 Communication Communication: No difficulties Dominant  Hand: Right         Vision/Perception     Cognition  Overall Cognitive Status: Appears within functional limits for tasks assessed/performed Arousal/Alertness: Awake/alert Orientation Level: Appears intact for tasks assessed Behavior During Session: [facility] for tasks performed    Extremity/Trunk Assessment Right Upper Extremity Assessment RUE ROM/Strength/Tone: Within functional levels RUE Sensation: WFL - Light Touch Left Upper Extremity Assessment LUE ROM/Strength/Tone: Within functional levels LUE Sensation: WFL - Light Touch  Trunk Assessment Trunk Assessment: Normal     Mobility Bed Mobility Supine to Sit: 4: Min guard;HOB flat Sitting - Scoot to Edge of Bed: 4: Min assist;With rail Details for Bed Mobility Assistance: patient out of bed in chair upon arrival Transfers Transfers: Sit to Stand;Stand to Sit Sit to Stand: 4: Min assist;With upper extremity assist;With armrests Stand to Sit: With armrests;With upper extremity assist;4: Min assist Details for Transfer Assistance: cues for right LE management           Balance Static Standing Balance Static Standing - Balance Support: Bilateral upper extremity supported Static Standing - Level of Assistance: 5: Stand by assistance Static Standing - Comment/# of Minutes: stood momentarily with RW with supervision prior to ambulation   End of Session OT - End of Session Activity Tolerance: Patient tolerated treatment well Patient left: in chair;with call bell/phone within reach Nurse Communication: Mobility status;Precautions  GO     Lucile Shutters 10/13/2012, 10:20 AM Pager: [phone]

## 2012-10-13 NOTE — Progress Notes (Signed)
Orthopaedic Trauma Service (OTS)  Subjective: 1 Day Post-Op Procedure(s) (LRB): OPEN REDUCTION INTERNAL FIXATION (ORIF) TIBIAL PLATEAU (Right)  Doing well No acute distress Good night last night  Pain controlled  Hinge brace has been fitted   Denies cp, no sob No n/v/d No headaches or visual changes No dizziness or lightheadedness  Pt concerned about help at home.  States her husband is not in the best physical condition so she is uncertain as to the amout of help he can provide. Ideally would like to go home but not against SNF  Objective: Current Vitals Blood pressure 155/42, pulse 92, temperature 99.1 F (37.3 C), temperature source Oral, resp. rate 20, height 5\' 4"  (1.626 m), weight 98.4 kg (216 lb 14.9 oz), SpO2 90.00%. Vital signs in last 24 hours: Temp:  [96.8 F (36 C)-99.1 F (37.3 C)] 99.1 F (37.3 C) (12/23 0646) Pulse Rate:  [77-92] 92  (12/23 0646) Resp:  [11-20] 20  (12/23 0646) BP: (115-161)/(42-82) 155/42 mmHg (12/23 0646) SpO2:  [90 %-99 %] 90 % (12/23 0646)  Intake/Output from previous day: 12/22 0701 - 12/23 0700 In: 3070 [P.O.:520; I.V.:2550] Out: 4125 [Urine:4075; Blood:50] Intake/Output      12/22 0701 - 12/23 0700 12/23 0701 - 12/24 0700   P.O. 520    I.V. (mL/kg) 2550 (25.9)    Total Intake(mL/kg) 3070 (31.2)    Urine (mL/kg/hr) 4075 (1.7)    Blood 50    Total Output 4125    Net -1055            LABS  Basename 10/13/12 0500 10/11/12 2107  HGB 9.9* 12.0    Basename 10/13/12 0500 10/11/12 2107  WBC 8.0 6.7  RBC 3.24* 3.89  HCT 30.0* 36.3  PLT 181 206    Basename 10/13/12 0500 10/11/12 2107  NA 136 140  K 3.7 3.6  CL 101 100  CO2 29 29  BUN 8 14  CREATININE 0.65 0.65  GLUCOSE 171* 112*  CALCIUM 9.2 9.8    Basename 10/11/12 2107  LABPT --  INR 1.02    Physical Exam  Gen: Awake and alert, NAD, appears comfortable Lungs:clear B Cardiac:reg Abd:NT, soft Ext:      Right Lower Extremity  Dressing c/d/i  Hinge  brace fitting well  Dpn, spn, tn sensation intact  EHL, FHL, AT, PT, peroneals and gastroc motor intact  + DP pulse  No DCT  Compartments soft and NT   Imaging  Dg Knee Right Port  10/12/2012  *RADIOLOGY REPORT*  Clinical Data: Postoperative exam  PORTABLE RIGHT KNEE - 1-2 VIEW  Comparison: Intraoperative imaging same date, preoperative imaging 10/11/2012  Findings: Evidence of side plate and screw fixation of the previously seen right lateral tibial plateau fracture noted with fracture fragments in near anatomic alignment.  Surgical staples in place.  Soft tissue gas noted.  Minimal tibial spine spurring.  IMPRESSION: Expected postoperative appearance after intraoperative fixation of lateral tibial plateau fracture.   Original Report Authenticated By: Christiana Pellant, M.D.    Dg C-arm 1-60 Min  10/12/2012  *RADIOLOGY REPORT*  Clinical Data: ORIF right tibial plateau fracture  RIGHT KNEE - COMPLETE 4+ VIEW,DG C-ARM 1-60 MIN  Comparison: Right knee radiographs - 10/11/2012  Findings:  29 spot fluoroscopic intraoperative radiographs of the right knee are provided for review.  Images demonstrate ORIF of previously noted depressed lateral tibial plateau fracture with side plate and multiple transfixing the proximal and distal cancellous screws.  Alignment appears near anatomic.  There is  a minimal amount of expected subcutaneous emphysema about the operative site.  No radiopaque foreign body.  IMPRESSION: Post ORIF of depressed lateral tibial plateau fracture.   Original Report Authenticated By: Tacey Ruiz, MD     Assessment/Plan: 1 Day Post-Op Procedure(s) (LRB): OPEN REDUCTION INTERNAL FIXATION (ORIF) TIBIAL PLATEAU (Right)  61 y/o female s/p fall at work with R tibial plateau fracture  1. Mechanical fall 2. R lateral tibial plateau fracture POD1   NWB x 6 weeks  Unrestricted R knee ROM  No pillows under knee when at rest  Ice and elevate  Dressing change tomorrow  PT/OT evals  today  3. HTN and diabetes  Pressures a little high, continue to monitor  Continue with SSI while inpt  Resume home meds at d/c 4. Metabolic Bone disease w/u  Labs pending  Supplement with vitam d and calcium 5. Obesity 6. Hyperlipidemia  Home meds 7. DVT/PE prophylaxis  lovenox for 21 days post opo 8. FEN  Carb modified diet  NSL iv 9. Dispo  PT/OT evals  Will get SW involved in case pt needs snf  Mearl Latin, PA-C [facility] [phone] (P) 10/13/2012, 8:18 AM

## 2012-10-13 NOTE — Progress Notes (Signed)
PT is recommending home with HH and not SNF. CSW will make CM aware. Clinical Social Worker will sign off for now as social work intervention is no longer needed. Please consult us again if new need arises.   Esaw Knippel, MSW [phone] 

## 2012-10-14 DIAGNOSIS — D62 Acute posthemorrhagic anemia: Secondary | ICD-10-CM

## 2012-10-14 LAB — CBC
MCH: 30.8 pg (ref 26.0–34.0)
MCV: 93.1 fL (ref 78.0–100.0)
Platelets: 174 10*3/uL (ref 150–400)
RBC: 3.21 MIL/uL — ABNORMAL LOW (ref 3.87–5.11)

## 2012-10-14 LAB — PTH, INTACT AND CALCIUM
Calcium, Total (PTH): 8.9 mg/dL (ref 8.4–10.5)
PTH: 26.4 pg/mL (ref 14.0–72.0)

## 2012-10-14 NOTE — Progress Notes (Signed)
CARE MANAGEMENT NOTE 10/14/2012  Patient:  [name]   Account Number:  [id number]  Date Initiated:  10/13/2012  Documentation initiated by:  Vance Peper  Subjective/Objective Assessment:   61 yr old female s/p right tibial plateau ORIF and faciatomy.     Action/Plan:   PAtient is under worker's comp. contacted [employer] with HR @ her employer: [phone]. She will call me after contacting [employer], claim hasnt been turned in as of yet.   Anticipated DC Date:  10/14/2012   Anticipated DC Plan:  HOME/SELF CARE      DC Planning Services  CM consult      PAC Choice  DURABLE MEDICAL EQUIPMENT   Choice offered to / List presented to:             Status of service:  In process, will continue to follow Medicare Important Message given?   (If response is "NO", the following Medicare IM given date fields will be blank) Date Medicare IM given:   Date Additional Medicare IM given:    Discharge Disposition:  HOME/SELF CARE  Per UR Regulation:    If discussed at Long Length of Stay Meetings, dates discussed:    Comments:  10/14/12 1010 Vance Peper, RN BSN CM called [employer]-[phone] ext [phone] to obtain Worker's Comp information. Left message concerning patient's need for a wheelchair and ambulance transport home. Will fax orders to necessary persons when information provided, likely to be after the holiday.

## 2012-10-14 NOTE — Progress Notes (Signed)
Physical Therapy Treatment Patient Details Name: [name] MRN: [id number] DOB: 10-09-1951 Today's Date: 10/14/2012 Time: 9147-8295 PT Time Calculation (min): 28 min  PT Assessment / Plan / Recommendation Comments on Treatment Session  Patient would be unsafe with spouse assist for getting up stairs into house today.  Will have ramp built after Thursday, so should be safe in case of emergency.  Also getting wheelchair for outings.  Patient will have HHPT follow up at d/c.    Follow Up Recommendations  Home health PT                 Equipment Recommendations  Wheelchair (measurements PT)        Frequency Min 5X/week   Plan Discharge plan remains appropriate    Precautions / Restrictions Precautions Precautions: Fall Other Brace/Splint: right knee blesde brace with open knee Restrictions RLE Weight Bearing: Non weight bearing   Pertinent Vitals/Pain Right knee 4/10 with mobility    Mobility  Bed Mobility Supine to Sit: 4: Min assist Sitting - Scoot to Edge of Bed: 4: Min assist Details for Bed Mobility Assistance: assist for right LE Transfers Sit to Stand: 4: Min assist;With upper extremity assist;From bed;From chair/3-in-1 Stand to Sit: 4: Min assist;To chair/3-in-1;With armrests;With upper extremity assist Ambulation/Gait Ambulation/Gait Assistance: 4: Min assist Ambulation Distance (Feet): 12 Feet Assistive device: Rolling walker Ambulation/Gait Assistance Details: NWB right LE Stairs: Yes Stairs Assistance: 3: Mod assist Stairs Assistance Details (indicate cue type and reason): Demonstrated how to use rail and crutch, patient unable to perform with mod assist, so tried reverse technique with walker and patient able to get up first step with min/mod assist and cues, but unable to get up second step with +1 assist for holding walker steady on steps.  Discussed spouse only one who will be available to help her in the house, so contacted social worker to set up  ambulance ride home. Stair Management Technique: With crutches;With walker;Forwards;Backwards;One rail Left Number of Stairs: 1     Exercises Other Exercises Other Exercises: educated in HEP with written handout given and indicated number of repititions and how to perform each.  Discussed with follow up with HHPT to enforce HEP.     PT Goals Acute Rehab PT Goals Pt will go Supine/Side to Sit: with modified independence PT Goal: Supine/Side to Sit - Progress: Progressing toward goal Pt will go Sit to Stand: with modified independence PT Goal: Sit to Stand - Progress: Progressing toward goal Pt will go Stand to Sit: with modified independence PT Goal: Stand to Sit - Progress: Progressing toward goal Pt will Stand: with modified independence PT Goal: Stand - Progress: Progressing toward goal Pt will Ambulate: 16 - 50 feet;with supervision;with rolling walker PT Goal: Ambulate - Progress: Progressing toward goal Pt will Perform Home Exercise Program: with supervision, verbal cues required/provided PT Goal: Perform Home Exercise Program - Progress: Progressing toward goal  Visit Information  Last PT Received On: 10/14/12    Subjective Data  Subjective: Doctor said you would show me how to get up the stairs   Cognition  Overall Cognitive Status: Appears within functional limits for tasks assessed/performed Behavior During Session: [facility] for tasks performed    Balance     End of Session PT - End of Session Equipment Utilized During Treatment: Gait belt Activity Tolerance: Patient limited by fatigue Patient left: in chair;with call bell/phone within reach Nurse Communication: Other (comment) (need for ambulance transport home at D/C)   GP  Tyara Dassow,CYNDI 10/14/2012, 12:16 PM Sheran Lawless, PT [phone] 10/14/2012

## 2012-10-14 NOTE — Progress Notes (Signed)
Orthopaedic Trauma Service (OTS)  Subjective: 2 Days Post-Op Procedure(s) (LRB): OPEN REDUCTION INTERNAL FIXATION (ORIF) TIBIAL PLATEAU (Right)  Patient doing extremely well Pain very well controlled No new issues noted. Patient working well with physical therapy. The patient is more suitable for discharge to home with home health  Patient denies any chest pain. No shortness of breath. No nausea or vomiting  no lightheadedness. No dizziness No headaches or visual changes   The patient is tolerating diet. Voiding without difficulty.  No bowel movement yet. Patient does not feel like she needs to have one   Objective: Current Vitals Blood pressure 135/59, pulse 76, temperature 98.7 F (37.1 C), temperature source Oral, resp. rate 16, height 5\' 4"  (1.626 m), weight 98.4 kg (216 lb 14.9 oz), SpO2 96.00%. Vital signs in last 24 hours: Temp:  [98.6 F (37 C)-99.9 F (37.7 C)] 98.7 F (37.1 C) (12/24 0126) Pulse Rate:  [76-82] 76  (12/24 0126) Resp:  [16-18] 16  (12/24 0126) BP: (124-151)/(43-59) 135/59 mmHg (12/24 0126) SpO2:  [96 %-100 %] 96 % (12/24 0126)  Intake/Output from previous day: 12/23 0701 - 12/24 0700 In: 1080 [P.O.:1080] Out: 500 [Urine:500] Intake/Output      12/23 0701 - 12/24 0700 12/24 0701 - 12/25 0700   P.O. 1080    I.V. (mL/kg)     Total Intake(mL/kg) 1080 (11)    Urine (mL/kg/hr) 500 (0.2)    Blood     Total Output 500    Net +580         Urine Occurrence 7 x       LABS  Basename 10/14/12 0722 10/13/12 0500 10/11/12 2107  HGB 9.9* 9.9* 12.0    Basename 10/14/12 0722 10/13/12 0500  WBC 6.4 8.0  RBC 3.21* 3.24*  HCT 29.9* 30.0*  PLT 174 181    Basename 10/13/12 0500 10/11/12 2107  NA 136 140  K 3.7 3.6  CL 101 100  CO2 29 29  BUN 8 14  CREATININE 0.65 0.65  GLUCOSE 171* 112*  CALCIUM 9.2 9.8    Basename 10/11/12 2107  LABPT --  INR 1.02   Vit D, 25-Hydroxy:  46    (30 - 89 ng/mL)   Physical Exam  ZOX:WRUEA and  alert, no acute distress. Resting comfortably in bed.  Lungs:Clear bilaterally  Cardiac:Regular, S1 and S2  VWU:JWJX, nontender, + bowel sounds  Ext:      Right lower extremity  Surgical sites look fantastic. No signs of infection wounds are stable.  Swelling is stable  Deep peroneal nerve, superficial peroneal nerve, tibial nerve sensory functions are intact  EHL, FHL, anterior tibialis, posterior tibialis, peroneals and gastrocsoleus complex motor function are intact  Palpable dorsalis pedis pulse  Compartments soft and nontender  No pain with passive stretch  Extremity is warm  Hinge knee brace fitting well   Assessment/Plan: 2 Days Post-Op Procedure(s) (LRB): OPEN REDUCTION INTERNAL FIXATION (ORIF) TIBIAL PLATEAU (Right)  61 y/o female s/p fall at work with R tibial plateau fracture   1. Mechanical fall  2. R lateral tibial plateau fracture POD 2  NWB x 6 weeks   Unrestricted R knee ROM   No pillows under knee when at rest   Ice and elevate   Dressing changed. Can change prn  PT/OT  3. HTN and diabetes   Pressures stable   Continue with SSI while inpt   Resume home meds at d/c  4. Metabolic Bone disease w/u   25 OH vitamin  D is a 46 ng/mL which is within acceptable range. Intact PTH is still pending. will continue with the   given patient's age she should have a bone density scan as an outpatient if she hasn't had one within the last 2 years.  5. Obesity  6. Hyperlipidemia   Home meds  7. DVT/PE prophylaxis   lovenox for 21 days post op (day 2/21) 8. FEN   Carb modified diet   Discontinue IV 9. Dispo   Continue with PT OT today. I would like for the patient to attempt stairs. As this seems to be her limitation in terms of getting into her house.   Feel that the patient is stable for discharge later on this afternoon.   Followup with orthopedics in 10-14 days   Mearl Latin, PA-C [facility] [phone] (P) 10/14/2012, 8:30 AM

## 2012-10-14 NOTE — Discharge Summary (Signed)
Orthopaedic Trauma Service (OTS)  Patient ID: [name] MRN: [id number] DOB/AGE: June 08, 1951 61 y.o.  Admit date: 10/11/2012 Discharge date: 10/14/2012  Admission Diagnoses:  Mechanical fall Right tibial plateau fracture, closed Hypertension Hyperlipidemia Obesity Diabetes, non-insulin-dependent    Discharge Diagnoses:  Principal Problem:  *Tibial plateau fracture Active Problems:  Diabetes mellitus  Hypertension associated with diabetes  Hyperlipidemia LDL goal <70  Obesity (BMI 30-39.9)  Postoperative anemia due to acute blood loss   Procedures Performed:  (10/12/2012)  1. ORIF of right lateral tibial plateau.  2. Anterior compartment fasciotomy.   Discharged Condition: good  Hospital Course:   Patient is a 61 year old Caucasian female who was working on 10/11/2012 when she sustained a mechanical fall resulting in injury to her right knee. Patient was taken to the [facility] [place] [facility]. She was found to have a right tibial plateau fracture. Dr. Shon Baton who is on-call for the med Center was contacted. given the complexity of the injury he contacted the orthopedic trauma service for consultation and for definitive management. Patient was admitted to [facility]. This occurred on 10/11/2012. (Please see consult note for full summary ) patient was seen by our service the following morning on 10/12/2012. We evaluated her soft tissue in the morning and determined that she was stable enough to proceed with surgical intervention as her fracture required surgery for correction of the deformity. Patient was taken to the operating room on 10/12/2012 with the procedures above were performed. The patient's medical history does complicate the overall picture in terms of soft tissue and bone healing do to her diabetes hypertension and obesity. She did not need any additional preoperative clearance. Thus we proceeded accordingly. Patient tolerated the procedure very well. After a  technically successful surgery she was taken to the PACU for recovery from anesthesia. She was then transferred back up to the orthopedic floor for continued observation, pain control and to begin therapies.  On postoperative day #1 patient was doing well her pain was well-controlled on IV Tylenol and breakthrough narcotics. Her Foley was discontinued on postoperative day 1 as well and she was started on Lovenox for DVT and PE prophylaxis. Patient began to work with therapies on postoperative day #1. And was tolerating this fairly well. She was tolerating a regular diet on postoperative day #1 as well. She was also fitted for a hinged knee brace to allow for range of motion at that time as well. Patient did have initial concerns about returning home as her husband does have some medical issues that limit his ability to care for her. It does sound like these issues were eased after her first therapy session. It was recommended that the patient returned home with home health. This was our initial inclination as well. On postoperative day #2 patient was continuing to do well pain again we still control. Her dressings were changed wounds were noted to be in excellent condition with no active drainage. Swelling is stable. After a further discussion with the patient was felt that the patient would be a suitable to return home on postoperative day #2. She is having a ramp built at her house that won't be ready until Thursday, I do feel that as long as the patient can successfully complete stairs with therapy she will be okay at returning to home. The patient was discharged on postoperative day #2, 10/12/2012 in stable condition. Tolerating regular diet and voiding without difficulty.  During her hospital stay there were no major issues with respect to  the patient's general medical conditions including her hypertension and diabetes. Her diabetes was managed with sliding scale insulin during her stay. Her oral hypoglycemic  agents were held. She was started back on her antihypertensive medications in the perioperative period as well.  We did initiate a metabolic bone disease workup as well during her hospital stay. At the time of this dictation her 61 [place] vitamin D has been the only lab that it returned and it is a 46 ng/mL which is normal. The patient will resume routine supplementation with vitamin D 3 and a calcium which will include 2000 IUs of vitamin D po daily and 600 mg of calcium carbonate po bid.  Consults: None  Significant Diagnostic Studies: labs:   Results for Cindy, [name] (MRN [id number]) as of 10/14/2012 08:52  Ref. Range 10/11/2012 21:07  Hemoglobin A1C Latest Range: <5.7 % 6.6 (H)    CBC    Component Value Date/Time   WBC 6.4 10/14/2012 0722   RBC 3.21* 10/14/2012 0722   HGB 9.9* 10/14/2012 0722   HCT 29.9* 10/14/2012 0722   PLT 174 10/14/2012 0722   MCV 93.1 10/14/2012 0722   MCH 30.8 10/14/2012 0722   MCHC 33.1 10/14/2012 0722   RDW 13.5 10/14/2012 0722   LYMPHSABS 1.2 06/27/2011 0944   MONOABS 0.3 06/27/2011 0944   EOSABS 0.1 06/27/2011 0944   BASOSABS 0.0 06/27/2011 0944    BMET    Component Value Date/Time   NA 136 10/13/2012 0500   K 3.7 10/13/2012 0500   CL 101 10/13/2012 0500   CO2 29 10/13/2012 0500   GLUCOSE 171* 10/13/2012 0500   BUN 8 10/13/2012 0500   CREATININE 0.65 10/13/2012 0500   CREATININE 0.61 06/27/2011 0944   CALCIUM 9.2 10/13/2012 0500   GFRNONAA >90 10/13/2012 0500   GFRAA >90 10/13/2012 0500    Results for Cindy, [name] (MRN [id number]) as of 10/14/2012 08:52  Ref. Range 10/13/2012 11:24 10/13/2012 16:08 10/13/2012 22:08 10/14/2012 06:43 10/14/2012 07:22  Glucose-Capillary Latest Range: 70-99 mg/dL 528 (H) 413 (H) 244 (H) 125 (H)    Results for Cindy, [name] (MRN [id number]) as of 10/14/2012 08:52  Ref. Range 04/07/2011 23:47 04/09/2011 03:50 06/27/2011 09:44 10/11/2012 21:07 10/13/2012 05:00  Vit D, 25-Hydroxy Latest Range: 30-89 ng/mL     46    Results for Cindy, [name] (MRN [id number]) as of 10/14/2012 08:52  Ref. Range 10/11/2012 22:33  Color, Urine Latest Range: YELLOW  YELLOW  APPearance Latest Range: CLEAR  CLEAR  Specific Gravity, Urine Latest Range: 1.005-1.030  1.010  pH Latest Range: 5.0-8.0  7.0  Glucose Latest Range: NEGATIVE mg/dL NEGATIVE  Bilirubin Urine Latest Range: NEGATIVE  NEGATIVE  Ketones, ur Latest Range: NEGATIVE mg/dL NEGATIVE  Protein Latest Range: NEGATIVE mg/dL NEGATIVE  Urobilinogen, UA Latest Range: 0.0-1.0 mg/dL 0.2  Nitrite Latest Range: NEGATIVE  NEGATIVE  Leukocytes, UA Latest Range: NEGATIVE  NEGATIVE  Hgb urine dipstick Latest Range: NEGATIVE  NEGATIVE    Treatments: IV hydration, antibiotics: Ancef, analgesia: acetaminophen, Morphine, Percocet, IV Tylenol and OxyIR, anticoagulation: LMW heparin, therapies: PT, OT and RN and surgery: As above  Discharge Exam:  Orthopaedic Trauma Service (OTS)  Subjective:  2 Days Post-Op Procedure(s) (LRB):  OPEN REDUCTION INTERNAL FIXATION (ORIF) TIBIAL PLATEAU (Right)  Patient doing extremely well  Pain very well controlled  No new issues noted.  Patient working well with physical therapy.  The patient is more suitable for discharge to home with home health  Patient denies any  chest pain. No shortness of breath.  No nausea or vomiting  no lightheadedness. No dizziness  No headaches or visual changes  The patient is tolerating diet. Voiding without difficulty.  No bowel movement yet. Patient does not feel like she needs to have one  Objective:  Current Vitals  Blood pressure 135/59, pulse 76, temperature 98.7 F (37.1 C), temperature source Oral, resp. rate 16, height 5\' 4"  (1.626 m), weight 98.4 kg (216 lb 14.9 oz), SpO2 96.00%.  Vital signs in last 24 hours:  Temp: [98.6 F (37 C)-99.9 F (37.7 C)] 98.7 F (37.1 C) (12/24 0126)  Pulse Rate: [76-82] 76 (12/24 0126)  Resp: [16-18] 16 (12/24 0126)  BP: (124-151)/(43-59) 135/59 mmHg  (12/24 0126)  SpO2: [96 %-100 %] 96 % (12/24 0126)  Intake/Output from previous day:  12/23 0701 - 12/24 0700  In: 1080 [P.O.:1080]  Out: 500 [Urine:500]  Intake/Output  12/23 0701 - 12/24 0700 12/24 0701 - 12/25 0700  P.O. 1080  I.V. (mL/kg)  Total Intake(mL/kg) 1080 (11)  Urine (mL/kg/hr) 500 (0.2)  Blood  Total Output 500  Net +580  Urine Occurrence 7 x   LABS   Basename  10/14/12 0722  10/13/12 0500  10/11/12 2107   HGB  9.9*  9.9*  12.0     Basename  10/14/12 0722  10/13/12 0500   WBC  6.4  8.0   RBC  3.21*  3.24*   HCT  29.9*  30.0*   PLT  174  181     Basename  10/13/12 0500  10/11/12 2107   NA  136  140   K  3.7  3.6   CL  101  100   CO2  29  29   BUN  8  14   CREATININE  0.65  0.65   GLUCOSE  171*  112*   CALCIUM  9.2  9.8     Basename  10/11/12 2107   LABPT  --   INR  1.02    Vit D, 25-Hydroxy: 46 (30 - 89 ng/mL)  Physical Exam  ZOX:WRUEA and alert, no acute distress. Resting comfortably in bed.  Lungs:Clear bilaterally  Cardiac:Regular, S1 and S2  VWU:JWJX, nontender, + bowel sounds  Ext:  Right lower extremity  Surgical sites look fantastic. No signs of infection wounds are stable.  Swelling is stable  Deep peroneal nerve, superficial peroneal nerve, tibial nerve sensory functions are intact  EHL, FHL, anterior tibialis, posterior tibialis, peroneals and gastrocsoleus complex motor function are intact  Palpable dorsalis pedis pulse  Compartments soft and nontender  No pain with passive stretch  Extremity is warm  Hinge knee brace fitting well  Assessment/Plan:  2 Days Post-Op Procedure(s) (LRB):  OPEN REDUCTION INTERNAL FIXATION (ORIF) TIBIAL PLATEAU (Right)  61 y/o female s/p fall at work with R tibial plateau fracture  1. Mechanical fall  2. R lateral tibial plateau fracture POD 2  NWB x 6 weeks  Unrestricted R knee ROM  No pillows under knee when at rest  Ice and elevate  Dressing changed. Can change prn  PT/OT  3. HTN and  diabetes  Pressures stable  Continue with SSI while inpt  Resume home meds at d/c  4. Metabolic Bone disease w/u  25 OH vitamin D is a 46 ng/mL which is within acceptable range. Intact PTH is still pending. will continue with the  given patient's age she should have a bone density scan as an outpatient if she hasn't had  one within the last 2 years.  5. Obesity  6. Hyperlipidemia  Home meds  7. DVT/PE prophylaxis  lovenox for 21 days post op (day 2/21)  8. FEN  Carb modified diet  Discontinue IV  9. Dispo  Continue with PT OT today. I would like for the patient to attempt stairs. As this seems to be her limitation in terms of getting into her house.  Feel that the patient is stable for discharge later on this afternoon.  Followup with orthopedics in 10-14 days    Mearl Latin, PA-C  [facility]  [phone] (P)  10/14/2012, 8:30 AM    Disposition: 01-Home or Self Care      Discharge Orders    Future Appointments: Provider: Department: Dept Phone: Center:   11/03/2012 8:30 AM Ronnald Nian, MD [facility] [phone]     Future Orders Please Complete By Expires   Diet Carb Modified      Call MD / Call 911      Comments:   If you experience chest pain or shortness of breath, CALL 911 and be transported to the hospital emergency room.  If you develope a fever above 101 F, pus (white drainage) or increased drainage or redness at the wound, or calf pain, call your surgeon's office.   Constipation Prevention      Comments:   Drink plenty of fluids.  Prune juice may be helpful.  You may use a stool softener, such as Colace (over the counter) 100 mg twice a day.  Use MiraLax (over the counter) for constipation as needed.   Increase activity slowly as tolerated      Discharge instructions      Comments:   Orthopaedic Trauma Service Discharge Instructions   General Discharge Instructions  WEIGHT BEARING STATUS: Nonweight bearing Right  Leg  RANGE OF MOTION/ACTIVITY: Range of motion as tolerated R ankle, knee and hip. Activity as tolerated while maintaining weight bearing restrictions  Diet: as you were eating previously.  Can use over the counter stool softeners and bowel preparations, such as Miralax, to help with bowel movements.  Narcotics can be constipating.  Be sure to drink plenty of fluids  STOP SMOKING OR USING NICOTINE PRODUCTS!!!!  As discussed nicotine severely impairs your body's ability to heal surgical and traumatic wounds but also impairs bone healing.  Wounds and bone heal by forming microscopic blood vessels (angiogenesis) and nicotine is a vasoconstrictor (essentially, shrinks blood vessels).  Therefore, if vasoconstriction occurs to these microscopic blood vessels they essentially disappear and are unable to deliver necessary nutrients to the healing tissue.  This is one modifiable factor that you can do to dramatically increase your chances of healing your injury.    (This means no smoking, no nicotine gum, patches, etc)  DO NOT USE NONSTEROIDAL ANTI-INFLAMMATORY DRUGS (NSAID'S)  Using products such as Advil (ibuprofen), Aleve (naproxen), Motrin (ibuprofen) for additional pain control during fracture healing can delay and/or prevent the healing response.  If you would like to take over the counter (OTC) medication, Tylenol (acetaminophen) is ok.  However, some narcotic medications that are given for pain control contain acetaminophen as well. Therefore, you should not exceed more than 4000 mg of tylenol in a day if you do not have liver disease.  Also note that there are may OTC medicines, such as cold medicines and allergy medicines that my contain tylenol as well.  If you have any questions about medications and/or interactions please ask your  doctor/PA or your pharmacist.   PAIN MEDICATION USE AND EXPECTATIONS  You have likely been given narcotic medications to help control your pain.  After a traumatic event  that results in an fracture (broken bone) with or without surgery, it is ok to use narcotic pain medications to help control one's pain.  We understand that everyone responds to pain differently and each individual patient will be evaluated on a regular basis for the continued need for narcotic medications. Ideally, narcotic medication use should last no more than 6-8 weeks (coinciding with fracture healing).   As a patient it is your responsibility as well to monitor narcotic medication use and report the amount and frequency you use these medications when you come to your office visit.   We would also advise that if you are using narcotic medications, you should take a dose prior to therapy to maximize you participation.  IF YOU ARE ON NARCOTIC MEDICATIONS IT IS NOT PERMISSIBLE TO OPERATE A MOTOR VEHICLE (MOTORCYCLE/CAR/TRUCK/MOPED) OR HEAVY MACHINERY DO NOT MIX NARCOTICS WITH OTHER CNS (CENTRAL NERVOUS SYSTEM) DEPRESSANTS SUCH AS ALCOHOL       ICE AND ELEVATE INJURED/OPERATIVE EXTREMITY  Using ice and elevating the injured extremity above your heart can help with swelling and pain control.  Icing in a pulsatile fashion, such as 20 minutes on and 20 minutes off, can be followed.    Do not place ice directly on skin. Make sure there is a barrier between to skin and the ice pack.    Using frozen items such as frozen peas works well as the conform nicely to the are that needs to be iced.  USE AN ACE WRAP OR TED HOSE FOR SWELLING CONTROL  In addition to icing and elevation, Ace wraps or TED hose are used to help limit and resolve swelling.  It is recommended to use Ace wraps or TED hose until you are informed to stop.    When using Ace Wraps start the wrapping distally (farthest away from the body) and wrap proximally (closer to the body)   Example: If you had surgery on your leg or thing and you do not have a splint on, start the ace wrap at the toes and work your way up to the thigh        If you  had surgery on your upper extremity and do not have a splint on, start the ace wrap at your fingers and work your way up to the upper arm  IF YOU ARE IN A SPLINT OR CAST DO NOT REMOVE IT FOR ANY REASON   If your splint gets wet for any reason please contact the office immediately. You may shower in your splint or cast as long as you keep it dry.  This can be done by wrapping in a cast cover or garbage back (or similar)  Do Not stick any thing down your splint or cast such as pencils, money, or hangers to try and scratch yourself with.  If you feel itchy take benadryl as prescribed on the bottle for itching  IF YOU ARE IN A CAM BOOT (BLACK BOOT)  You may remove boot periodically. Perform daily dressing changes as noted below.  Wash the liner of the boot regularly and wear a sock when wearing the boot. It is recommended that you sleep in the boot until told otherwise  CALL THE OFFICE WITH ANY QUESTIONS OR CONCERTS: [phone]     Discharge Pin Site Instructions  Dress pins daily with  Kerlix roll starting on POD 2. Wrap the Kerlix so that it tamps the skin down around the pin-skin interface to prevent/limit motion of the skin relative to the pin.  (Pin-skin motion is the primary cause of pain and infection related to external fixator pin sites).  Remove any crust or coagulum that may obstruct drainage with a saline moistened gauze or soap and water.  After POD 3, if there is no discernable drainage on the pin site dressing, the interval for change can by increased to every other day.  You may shower with the fixator, cleaning all pin sites gently with soap and water.  If you have a surgical wound this needs to be completely dry and without drainage before showering.  The extremity can be lifted by the fixator to facilitate wound care and transfers.  Notify the office/Doctor if you experience increasing drainage, redness, or pain from a pin site, or if you notice purulent (thick, snot-like)  drainage.  Discharge Wound Care Instructions  Do NOT apply any ointments, solutions or lotions to pin sites or surgical wounds.  These prevent needed drainage and even though solutions like hydrogen peroxide kill bacteria, they also damage cells lining the pin sites that help fight infection.  Applying lotions or ointments can keep the wounds moist and can cause them to breakdown and open up as well. This can increase the risk for infection. When in doubt call the office.  Surgical incisions should be dressed daily.  If any drainage is noted, use one layer of adaptic, then gauze, Kerlix, and an ace wrap.  Once the incision is completely dry and without drainage, it may be left open to air out.  Showering may begin 36-48 hours later.  Cleaning gently with soap and water.  Traumatic wounds should be dressed daily as well.    One layer of adaptic, gauze, Kerlix, then ace wrap.  The adaptic can be discontinued once the draining has ceased    If you have a wet to dry dressing: wet the gauze with saline the squeeze as much saline out so the gauze is moist (not soaking wet), place moistened gauze over wound, then place a dry gauze over the moist one, followed by Kerlix wrap, then ace wrap.   Non weight bearing      Comments:   Right Leg   Driving restrictions      Comments:   No driving   Lifting restrictions      Comments:   No lifting   Do not put a pillow under the knee. Place it under the heel.          Medication List     As of 10/14/2012  8:43 AM    TAKE these medications         acetaminophen 325 MG tablet   Commonly known as: TYLENOL   Take 1-2 tablets (325-650 mg total) by mouth every 6 (six) hours as needed.      aspirin EC 81 MG tablet   Take 81 mg by mouth daily.      atorvastatin 10 MG tablet   Commonly known as: LIPITOR   Take 1 tablet (10 mg total) by mouth daily.      calcium carbonate 600 MG Tabs   Commonly known as: OS-CAL   Take 600 mg by mouth 2 (two)  times daily with a meal.      DSS 100 MG Caps   Take 100 mg by mouth 2 (two) times daily.  enoxaparin 40 MG/0.4ML injection   Commonly known as: LOVENOX   Inject 0.4 mLs (40 mg total) into the skin daily.      glucose blood test strip   USE AS DIRECTED      KRILL OIL 1000 MG Caps   Take 1,000 mg by mouth every evening.      lisinopril-hydrochlorothiazide 20-12.5 MG per tablet   Commonly known as: PRINZIDE,ZESTORETIC   Take 1 tablet by mouth daily.      methocarbamol 500 MG tablet   Commonly known as: ROBAXIN   Take 1-2 tablets (500-1,000 mg total) by mouth every 6 (six) hours as needed (spasm).      multivitamin with minerals tablet   Take 1 tablet by mouth daily.      ONE TOUCH LANCETS Misc   USE AS DIRECTED      oxyCODONE 5 MG immediate release tablet   Commonly known as: Oxy IR/ROXICODONE   Take 1-2 tablets (5-10 mg total) by mouth every 4 (four) hours as needed (breakthrough pain).      oxyCODONE-acetaminophen 5-325 MG per tablet   Commonly known as: PERCOCET/ROXICET   Take 1-2 tablets by mouth every 6 (six) hours as needed for pain.      pioglitazone 30 MG tablet   Commonly known as: ACTOS   Take 1 tablet (30 mg total) by mouth daily.      polyethylene glycol packet   Commonly known as: MIRALAX / GLYCOLAX   Take 17 g by mouth daily.      Vitamin D 2000 UNITS tablet   Take 2,000 Units by mouth daily.        Follow-up Information    Follow up with HANDY,MICHAEL H, MD. Schedule an appointment as soon as possible for a visit in 14 days.   Contact information:   [address] [place] [address] [phone]          Discharge Instructions and Plan:  The patient has sustained a fairly significant injury to her right knee as well as at the joint surface. We were able to achieve excellent fixation with plate osteosynthesis. We're able to restore alignment, stability, evaluate the meniscus her injury as well as  restored joint surface congruity. All of these things considered we are hopeful that the patient can go on and heal uneventfully and recover full function. Patient is at risk for the development of posttraumatic arthritis given the intra-articular nature of this fracture. Patient will be nonweightbearing on her right leg for the next 6-8 weeks. She has unrestricted range of motion of her right knee in the hinged knee brace. If the hinged knee brace begin hinder her range of motion we will remove this until we begin to weight-bear. Patient will follow total knee precautions. She will not have a pillow placed under her knee when at rest. If she is to elevate her leg to pillow will be placed under his heel or calf. Again we reviewed expectations in terms of range of motion. We want the patient to get full extension as soon as possible and 90 of flexion by 4 weeks postoperatively The patient will be engaged in home health physical therapy when she stopped and then transition her to outpatient physical therapy.  the patient remain on Lovenox for the next 19 days to reach 21 days of coverage postoperatively for a prophylaxis. Patient were resume all of her home medications. At the time of discharge she will resume her  oral hypoglycemics. She was covered with sliding scale while inpatient.  Wound care instructions were reviewed with the patient and included in her discharge paperwork. She should cover her wounds daily with a dry dressing until drainage stops. She may then leave them open to air. She can clean with soap and water and dry and well.   we will check the patient back in 10-14 days for reevaluation, followup x-rays and removal of her staples. Should the patient have any questions prior to followup she is encouraged to contact the office.  Patient will resume prehospital diet   Signed:  Mearl Latin, PA-C [facility] [phone] (P) 10/14/2012, 8:43 AM

## 2012-10-14 NOTE — Progress Notes (Signed)
Orthopedic Tech Progress Note Patient Details:  [name] January 29, 1951 [id number]  Patient ID: [name], female   DOB: 14-Feb-1951, 61 y.o.   MRN: [id number]   Shawnie Pons 10/14/2012, 10:10 AM CALLED [facility] FOR HINGED KNEE BRACE UNLOCKED.

## 2012-10-16 LAB — VITAMIN D 1,25 DIHYDROXY: Vitamin D 1, 25 (OH)2 Total: 49 pg/mL (ref 18–72)

## 2012-10-17 NOTE — Care Management Note (Signed)
    Page 1 of 2   10/17/2012     11:33:51 AM   CARE MANAGEMENT NOTE 10/17/2012  Patient:  Cindy, [name]   Account Number:  [id number]  Date Initiated:  10/13/2012  Documentation initiated by:  Vance Peper  Subjective/Objective Assessment:   61 yr old female s/p right tibial plateau ORIF and faciatomy.     Action/Plan:   PAtient is under worker's comp. contacted [employer] with HR @ her employer: [phone]. She will call me after contacting [employer], claim hasnt been turned in as of yet.   Anticipated DC Date:  10/14/2012   Anticipated DC Plan:  HOME/SELF CARE      DC Planning Services  CM consult      PAC Choice  DURABLE MEDICAL EQUIPMENT   Choice offered to / List presented to:             Status of service:  Completed, signed off Medicare Important Message given?  NO (If response is "NO", the following Medicare IM given date fields will be blank) Date Medicare IM given:   Date Additional Medicare IM given:    Discharge Disposition:  HOME/SELF CARE  Per UR Regulation:  Reviewed for med. necessity/level of care/duration of stay  If discussed at Long Length of Stay Meetings, dates discussed:    Comments:  10/17/12 11:32  Anette Guarneri RN/CM Received call from [facility], per [place] fax recieved and she will arrange wheelchair with their vendor and will arrange HHPT.   10/17/12  09:38 Anette Guarneri RN/CM No call back received from [facility]/Adjuster, attempted to contact again, out of office til ? Spoke with [employer], obtained  fax #[phone], faxed orders, face sheet and op report.   10/16/12  0940  Anette Guarneri RN/CM contacted [phone], informed that adjuster is Gevena Barre, contact ph# [phone] call Gevena Barre, left voice message and call back ph#.   10/16/12 0844 Anette Guarneri RN/CM per Rolley Sims, cl# is [id number] contact ph# is [phone]   10/14/12 1010 Vance Peper, RN BSN CM called  [employer]-[phone] ext [phone] to obtain [employer]. Left message concerning patient's need for a wheelchair and ambulance transport home. Will fax orders to necessary persons when information provided, likely to be after the holiday.

## 2012-10-20 NOTE — Consult Note (Signed)
I have seen and examined the patient. I agree with the above. Patient has a lateral plateau fracture with likely meniscal involvement. Surgery anticipated and risks and benefits discussed with patient in detail. She wishes to proceed.  Myrene Galas, MD [facility] [phone] (p)

## 2012-10-20 NOTE — Progress Notes (Signed)
I have seen and examined the patient. I agree with the above.  Myrene Galas, MD [facility] [phone] (p)

## 2012-10-26 ENCOUNTER — Encounter (HOSPITAL_COMMUNITY): Payer: Self-pay | Admitting: *Deleted

## 2012-10-26 ENCOUNTER — Emergency Department (HOSPITAL_COMMUNITY): Payer: Worker's Compensation

## 2012-10-26 ENCOUNTER — Emergency Department (HOSPITAL_COMMUNITY)
Admission: EM | Admit: 2012-10-26 | Discharge: 2012-10-26 | Disposition: A | Discharge status: Home / Self Care | Payer: Worker's Compensation | Attending: Emergency Medicine | Admitting: Emergency Medicine

## 2012-10-26 DIAGNOSIS — Z7982 Long term (current) use of aspirin: Secondary | ICD-10-CM | POA: Insufficient documentation

## 2012-10-26 DIAGNOSIS — Y929 Unspecified place or not applicable: Secondary | ICD-10-CM | POA: Insufficient documentation

## 2012-10-26 DIAGNOSIS — S52502A Unspecified fracture of the lower end of left radius, initial encounter for closed fracture: Secondary | ICD-10-CM

## 2012-10-26 DIAGNOSIS — I10 Essential (primary) hypertension: Secondary | ICD-10-CM | POA: Insufficient documentation

## 2012-10-26 DIAGNOSIS — Y939 Activity, unspecified: Secondary | ICD-10-CM | POA: Insufficient documentation

## 2012-10-26 DIAGNOSIS — R296 Repeated falls: Secondary | ICD-10-CM | POA: Insufficient documentation

## 2012-10-26 DIAGNOSIS — Z79899 Other long term (current) drug therapy: Secondary | ICD-10-CM | POA: Insufficient documentation

## 2012-10-26 DIAGNOSIS — E119 Type 2 diabetes mellitus without complications: Secondary | ICD-10-CM | POA: Insufficient documentation

## 2012-10-26 DIAGNOSIS — S52599A Other fractures of lower end of unspecified radius, initial encounter for closed fracture: Secondary | ICD-10-CM | POA: Insufficient documentation

## 2012-10-26 DIAGNOSIS — E669 Obesity, unspecified: Secondary | ICD-10-CM | POA: Insufficient documentation

## 2012-10-26 DIAGNOSIS — E785 Hyperlipidemia, unspecified: Secondary | ICD-10-CM | POA: Insufficient documentation

## 2012-10-26 MED ORDER — MORPHINE SULFATE 4 MG/ML IJ SOLN
4.0000 mg | Freq: Once | INTRAMUSCULAR | Status: AC
  Administered 2012-10-26: 4 mg via INTRAVENOUS
  Filled 2012-10-26: qty 1

## 2012-10-26 MED ORDER — OXYCODONE-ACETAMINOPHEN 5-325 MG PO TABS: 1.0000 | ORAL_TABLET | ORAL | Status: DC | PRN

## 2012-10-26 MED ORDER — ONDANSETRON HCL 4 MG/2ML IJ SOLN
4.0000 mg | Freq: Once | INTRAMUSCULAR | Status: AC
  Administered 2012-10-26: 4 mg via INTRAVENOUS
  Filled 2012-10-26 (×2): qty 2

## 2012-10-26 NOTE — Progress Notes (Signed)
8:20 AM PT evaluation to determine home health needs ordered.

## 2012-10-26 NOTE — Evaluation (Signed)
Physical Therapy Evaluation Patient Details Name: [name] MRN: [id number] DOB: May 19, 1951 Today's Date: 10/26/2012 Time: 4098-1191 PT Time Calculation (min): 28 min  PT Assessment / Plan / Recommendation Clinical Impression  Patient is a 62 yo female admitted following a fall with Lt. radial fx.  Patient with recent RLE injury - NWB and Bledsoe brace.  Patient and husband did well with transfer training.  Recommend patient function at transfer/wheelchair level at home.  Patient has all necessary equipment needed.  Recommend HHPT continue.  Also recommend [facility] aide for ADL's due to patient's limited mobility and inability to assist with ADL's now.    PT Assessment  All further PT needs can be met in the next venue of care    Follow Up Recommendations  Home health PT;Supervision/Assistance - 24 hour;Other (comment) (Home Health Aide)    Does the patient have the potential to tolerate intense rehabilitation      Barriers to Discharge None      Equipment Recommendations  None recommended by PT    Recommendations for Other Services     Frequency      Precautions / Restrictions Precautions Precautions: Fall Required Braces or Orthoses: Other Brace/Splint Other Brace/Splint: Bledsoe brace RLE; Splint/sling LUE Restrictions Weight Bearing Restrictions: Yes LUE Weight Bearing: Non weight bearing RLE Weight Bearing: Non weight bearing   Pertinent Vitals/Pain Pain limiting mobility      Mobility  Bed Mobility Bed Mobility: Supine to Sit;Sit to Supine Supine to Sit: 4: Min assist Sit to Supine: 4: Min guard Details for Bed Mobility Assistance: Cues for technique with inability to use LUE. Transfers Transfers: Sit to Stand;Stand to Sit;Stand Pivot Transfers Sit to Stand: 4: Min assist;From bed;From chair/3-in-1;With upper extremity assist Stand to Sit: 4: Min assist;Without upper extremity assist;To bed;To chair/3-in-1 Stand Pivot Transfers: 4: Min assist Details for  Transfer Assistance: Instructed in bed <> w/c transfers remaining NWB on RLE.  Uses RUE to assist with transfers.  Instructed husband on guarding/assisting patient with transfers.   Ambulation/Gait Ambulation/Gait Assistance: Not tested (comment)           PT Diagnosis: Difficulty walking;Acute pain  PT Problem List: Decreased strength;Decreased range of motion;Decreased mobility;Decreased balance;Pain PT Treatment Interventions:   N/a  PT Goals  N/A  Visit Information  Last PT Received On: 10/26/12 Assistance Needed: +1    Subjective Data  Subjective: "I can't use a walker now" Patient Stated Goal: To be able to go home safely   Prior Functioning  Home Living Lives With: Spouse Available Help at Discharge: Family;Available 24 hours/day Type of Home: House Home Access: Ramped entrance Home Layout: One level Bathroom Shower/Tub: Engineer, manufacturing systems: Standard Bathroom Accessibility: Yes How Accessible: Accessible via walker Home Adaptive Equipment: Bedside commode/3-in-1;Shower chair with back;Crutches;Walker - rolling Prior Function Level of Independence: Independent Able to Take Stairs?: Yes Driving: Yes Vocation: Full time employment Communication Communication: No difficulties    Cognition  Overall Cognitive Status: Appears within functional limits for tasks assessed/performed Arousal/Alertness: Awake/alert Orientation Level: Oriented X4 / Intact Behavior During Session: [facility] for tasks performed    Extremity/Trunk Assessment Right Upper Extremity Assessment RUE ROM/Strength/Tone: WFL for tasks assessed RUE Sensation: WFL - Light Touch Left Upper Extremity Assessment LUE ROM/Strength/Tone: Deficits;Unable to fully assess;Due to precautions;Due to pain LUE ROM/Strength/Tone Deficits: LUE in sling Right Lower Extremity Assessment RLE ROM/Strength/Tone: Deficits RLE ROM/Strength/Tone Deficits: Able to move against gravity; Bledsoe brace in place Left  Lower Extremity Assessment LLE ROM/Strength/Tone: Within functional levels  LLE Sensation: WFL - Light Touch   Balance    End of Session PT - End of Session Equipment Utilized During Treatment: Gait belt Activity Tolerance: Patient limited by pain Patient left: in bed;with call bell/phone within reach;with family/visitor present Nurse Communication: Mobility status;Other (comment) (Need for [facility] aide; Pt reports difficulty with injection admin)  GP Functional Assessment Tool Used: Clinical judgement Functional Limitation: Mobility: Walking and moving around Mobility: Walking and Moving Around Current Status [phone]): At least 40 percent but less than 60 percent impaired, limited or restricted Mobility: Walking and Moving Around Discharge Status [phone]): At least 40 percent but less than 60 percent impaired, limited or restricted   Vena Austria 10/26/2012, 9:50 AM Durenda Hurt. Renaldo Fiddler, [facility] Acute Rehab Services Pager [phone]

## 2012-10-26 NOTE — Progress Notes (Signed)
Orthopedic Tech Progress Note Patient Details:  [name] Aug 03, 1951 [id number]  Ortho Devices Type of Ortho Device: Arm sling;Sugartong splint   Haskell Flirt 10/26/2012, 6:17 AM

## 2012-10-26 NOTE — Progress Notes (Signed)
NCM received call from husband. [facility] is doing PT, # [phone]. And the Case Worker is Gevena Barre, # [phone]. NCM will follow up with Worker's Comp CW on 1/6. Will fax orders to [facility] for [facility] Aide for soc 1/6. Isidoro Donning RN CCM Case Mgmt phone [phone]

## 2012-10-26 NOTE — ED Provider Notes (Signed)
History     CSN: [id number]  Arrival date & time 10/26/12  4098   First MD Initiated Contact with Patient 10/26/12 [phone]      Chief Complaint  Patient presents with  . Wrist Pain     The history is provided by the patient.   the patient is currently healing from a recent surgery on her right tibia for it to be a plateau fracture.  She was ambulating to the bathroom with her walker when she fell forward landing on an outstretched left breast.  She presents now with acute moderate pain in her left wrist.  Her left wrist pain is worse with movement and palpation.  She denies weakness or numbness.  She denies head injury or neck pain.  No chest pain shortness of breath.  She's concerned that she may have Barrett weight on her right lower extremity which is currently healing from her recent surgery for which she is post be nonweightbearing on her right lower extremity.  She denies acute pain in her right lower extremity.  Past Medical History  Diagnosis Date  . Diabetes mellitus   . Hypertension   . Dyslipidemia   . Obesity     Past Surgical History  Procedure Date  . Total abdominal hysterectomy w/ bilateral salpingoophorectomy     fibroids  . Fractured patella repair 02/2011    Dr. Thomasena Edis; R knee  . Orif tibia plateau 10/12/2012    Procedure: OPEN REDUCTION INTERNAL FIXATION (ORIF) TIBIAL PLATEAU;  Surgeon: Budd Palmer, MD;  Location: [facility] OR;  Service: Orthopedics;  Laterality: Right;    No family history on file.  History  Substance Use Topics  . Smoking status: Never Smoker   . Smokeless tobacco: Never Used  . Alcohol Use: No    OB History    Grav Para Term Preterm Abortions TAB SAB Ect Mult Living                  Review of Systems  All other systems reviewed and are negative.    Allergies  Review of patient's allergies indicates no known allergies.  Home Medications   Current Outpatient Rx  Name  Route  Sig  Dispense  Refill  . ACETAMINOPHEN 325 MG PO  TABS   Oral   Take 1-2 tablets (325-650 mg total) by mouth every 6 (six) hours as needed.         . ASPIRIN EC 81 MG PO TBEC   Oral   Take 81 mg by mouth daily.         . ATORVASTATIN CALCIUM 10 MG PO TABS   Oral   Take 1 tablet (10 mg total) by mouth daily.   90 tablet   3   . CALCIUM CARBONATE 600 MG PO TABS   Oral   Take 600 mg by mouth 2 (two) times daily with a meal.           . VITAMIN D 2000 UNITS PO TABS   Oral   Take 2,000 Units by mouth daily.         . DSS 100 MG PO CAPS   Oral   Take 100 mg by mouth 2 (two) times daily as needed. For constipation         . ENOXAPARIN SODIUM 40 MG/0.4ML [place] SOLN   Subcutaneous   Inject 0.4 mLs (40 mg total) into the skin daily.   19 Syringe   0   . KRILL OIL 1000  MG PO CAPS   Oral   Take 1,000 mg by mouth every evening.         Marland Kitchen LISINOPRIL-HYDROCHLOROTHIAZIDE 20-12.5 MG PO TABS   Oral   Take 1 tablet by mouth daily.   90 tablet   3   . METHOCARBAMOL 500 MG PO TABS   Oral   Take 1-2 tablets (500-1,000 mg total) by mouth every 6 (six) hours as needed (spasm).   60 tablet   1   . MULTI-VITAMIN/MINERALS PO TABS   Oral   Take 1 tablet by mouth daily.          . OXYCODONE HCL 5 MG PO TABS   Oral   Take 1-2 tablets (5-10 mg total) by mouth every 4 (four) hours as needed (breakthrough pain).   50 tablet   0   . OXYCODONE-ACETAMINOPHEN 5-325 MG PO TABS   Oral   Take 1-2 tablets by mouth every 6 (six) hours as needed for pain.   90 tablet   0   . PIOGLITAZONE HCL 30 MG PO TABS   Oral   Take 1 tablet (30 mg total) by mouth daily.   90 tablet   1   . POLYETHYLENE GLYCOL 3350 PO PACK   Oral   Take 17 g by mouth daily as needed. For constipation         . GLUCOSE BLOOD VI STRP      USE AS DIRECTED   100 each   PRN   . ONETOUCH LANCETS MISC      USE AS DIRECTED   200 each   PRN     BP 110/42  Pulse 72  Temp 98.4 F (36.9 C) (Oral)  Resp 16  Ht 5\' 4"  (1.626 m)  Wt 200 lb (90.719  kg)  BMI 34.33 kg/m2  SpO2 97%  Physical Exam  Nursing note and vitals reviewed. Constitutional: She is oriented to person, place, and time. She appears well-developed and well-nourished. No distress.  HENT:  Head: Normocephalic and atraumatic.  Eyes: EOM are normal.  Neck: Normal range of motion.  Cardiovascular: Normal rate, regular rhythm and normal heart sounds.   Pulmonary/Chest: Effort normal and breath sounds normal.  Abdominal: Soft. She exhibits no distension. There is no tenderness.  Musculoskeletal:       Left wrist with tenderness over the distal radius.  No obvious deformity.  Normal left radial pulse.  Normal strength and sensation in left hand.  Full range of motion of left shoulder and left elbow.  Right lower extremity with healing surgical wounds.  Full range of motion about her right ankle and right knee.  Normal pulses in right foot.  Compartments are soft.  Neurological: She is alert and oriented to person, place, and time.  Skin: Skin is warm and dry.  Psychiatric: She has a normal mood and affect. Judgment normal.    ED Course  Procedures (including critical care time)  Labs Reviewed - No data to display Dg Wrist Complete Left  10/26/2012  *RADIOLOGY REPORT*  Clinical Data: Prior right ankle fracture, fell while using a walker, left wrist pain  LEFT WRIST - COMPLETE 3+ VIEW  Comparison: 03/17/2011  Findings: Diffuse osseous demineralization. Comminuted distal left radial metaphyseal fracture with mild dorsal tilt of the distal radial articular surface. Question intra-articular extension into the distal radioulnar joint. No additional fracture, dislocation, or bone destruction.  IMPRESSION: Minimally angulated and comminuted distal left radial metaphyseal fracture with questionable extension into the distal  radioulnar joint.   Original Report Authenticated By: Ulyses Southward, M.D.    I personally reviewed the imaging tests through PACS system I reviewed available  ER/hospitalization records through the EMR   No diagnosis found.    MDM  The patient will need followup with hand surgery.  She'll be splinted in the emergency department.  There is some concern of the husband regarding his ability to care for his wife at home given her inability to the airway on her right lower extremity and now her left upper extremity.  Urine he has a ramp at the house.  He has a wheelchair at the house.  His only major issue is giving the wheelchair into the bathroom and therefore an operative bedside commode.  He is still hesitant.  We will involve case management to see that additional home health services such as AIDE.   Care to oncoming physician to follow up on home health/case management consultation       Lyanne Co, MD 10/26/12 [phone]

## 2012-10-26 NOTE — Progress Notes (Signed)
11:59 AM Pt had PT evaluation, and had telephone consultation with Case Management, who will arrange followup PT and home health care through the agency pt is already using for her tibial plateau fracture.  Released with Dr. Patria Mane' discharge instructions.

## 2012-10-26 NOTE — ED Notes (Signed)
The patient lost her balance and fell.  She tried to brace herself using her left hand.  After the fall, she noted left wrist pain and swelling and obvious deformity.  The patient denies decreased LOC prior to or after the fall.

## 2012-10-26 NOTE — Progress Notes (Signed)
NCM spoke to husband and he will give NCM a call when he gets home with name of HH and Worker's Comp [employer]. NCM will set up [facility]  Aide as pt already has HH PT. States Worker's Comp is paying for [facility] PT but he is not sure who will cover aide. Will give Case Worker a call on 10/27/2012 to follow up. Will fax orders to [facility] agency once info received from husband. Facesheet info verified with husband. Isidoro Donning RN CCM Case Management

## 2012-10-26 NOTE — ED Notes (Signed)
Report given to CDU RN. Plan to consult case management when they arrive this morning around 8 am. Pt and husband needing home health consult to take patient home. Pt otherwise ready for discharge, alert and oriented.

## 2012-10-30 ENCOUNTER — Encounter (HOSPITAL_COMMUNITY): Payer: Self-pay | Admitting: Pharmacy Technician

## 2012-10-31 ENCOUNTER — Encounter (HOSPITAL_COMMUNITY): Payer: Self-pay | Admitting: *Deleted

## 2012-11-02 MED ORDER — CEFAZOLIN SODIUM-DEXTROSE 2-3 GM-% IV SOLR: 2.0000 g | INTRAVENOUS | Status: AC

## 2012-11-02 NOTE — Progress Notes (Signed)
NCM faxed paperwork to [employer] Rep, Gevena Barre, fax # [phone]. Isidoro Donning RN CCM Case Mgmt phone [phone]

## 2012-11-03 ENCOUNTER — Ambulatory Visit (HOSPITAL_COMMUNITY): Payer: Worker's Compensation

## 2012-11-03 ENCOUNTER — Ambulatory Visit (HOSPITAL_COMMUNITY): Payer: Worker's Compensation | Admitting: Certified Registered"

## 2012-11-03 ENCOUNTER — Encounter (HOSPITAL_COMMUNITY): Payer: Self-pay | Admitting: Certified Registered"

## 2012-11-03 ENCOUNTER — Encounter (HOSPITAL_COMMUNITY)
Admission: RE | Disposition: A | Discharge status: Home / Self Care | Payer: Self-pay | Source: Ambulatory Visit | Attending: Orthopedic Surgery

## 2012-11-03 ENCOUNTER — Ambulatory Visit (HOSPITAL_COMMUNITY)
Admission: RE | Admit: 2012-11-03 | Discharge: 2012-11-03 | Disposition: A | Discharge status: Home / Self Care | Payer: Worker's Compensation | Source: Ambulatory Visit | Attending: Orthopedic Surgery | Admitting: Orthopedic Surgery

## 2012-11-03 ENCOUNTER — Ambulatory Visit: Payer: BC Managed Care – PPO | Admitting: Family Medicine

## 2012-11-03 DIAGNOSIS — E119 Type 2 diabetes mellitus without complications: Secondary | ICD-10-CM | POA: Insufficient documentation

## 2012-11-03 DIAGNOSIS — S52599A Other fractures of lower end of unspecified radius, initial encounter for closed fracture: Secondary | ICD-10-CM | POA: Insufficient documentation

## 2012-11-03 DIAGNOSIS — E785 Hyperlipidemia, unspecified: Secondary | ICD-10-CM | POA: Insufficient documentation

## 2012-11-03 DIAGNOSIS — Z7982 Long term (current) use of aspirin: Secondary | ICD-10-CM | POA: Insufficient documentation

## 2012-11-03 DIAGNOSIS — E669 Obesity, unspecified: Secondary | ICD-10-CM | POA: Insufficient documentation

## 2012-11-03 DIAGNOSIS — X58XXXA Exposure to other specified factors, initial encounter: Secondary | ICD-10-CM | POA: Insufficient documentation

## 2012-11-03 DIAGNOSIS — Z90710 Acquired absence of both cervix and uterus: Secondary | ICD-10-CM | POA: Insufficient documentation

## 2012-11-03 DIAGNOSIS — I10 Essential (primary) hypertension: Secondary | ICD-10-CM | POA: Insufficient documentation

## 2012-11-03 HISTORY — PX: ORIF WRIST FRACTURE: SHX2133

## 2012-11-03 LAB — CBC WITH DIFFERENTIAL/PLATELET
Basophils Relative: 1 % (ref 0–1)
Eosinophils Absolute: 0.1 10*3/uL (ref 0.0–0.7)
MCH: 29.5 pg (ref 26.0–34.0)
MCHC: 31.9 g/dL (ref 30.0–36.0)
Neutrophils Relative %: 64 % (ref 43–77)
Platelets: 260 10*3/uL (ref 150–400)

## 2012-11-03 LAB — APTT: aPTT: 29 seconds (ref 24–37)

## 2012-11-03 LAB — COMPREHENSIVE METABOLIC PANEL
ALT: 19 U/L (ref 0–35)
Albumin: 3.7 g/dL (ref 3.5–5.2)
Alkaline Phosphatase: 92 U/L (ref 39–117)
BUN: 21 mg/dL (ref 6–23)
Potassium: 4.4 mEq/L (ref 3.5–5.1)
Sodium: 138 mEq/L (ref 135–145)
Total Protein: 7 g/dL (ref 6.0–8.3)

## 2012-11-03 LAB — PROTIME-INR: Prothrombin Time: 13.7 seconds (ref 11.6–15.2)

## 2012-11-03 LAB — GLUCOSE, CAPILLARY
Glucose-Capillary: 119 mg/dL — ABNORMAL HIGH (ref 70–99)
Glucose-Capillary: 99 mg/dL (ref 70–99)

## 2012-11-03 SURGERY — OPEN REDUCTION INTERNAL FIXATION (ORIF) WRIST FRACTURE
Anesthesia: Regional | Site: Wrist | Laterality: Left | Wound class: Clean

## 2012-11-03 MED ORDER — OXYCODONE-ACETAMINOPHEN 5-325 MG PO TABS: 1.0000 | ORAL_TABLET | Freq: Four times a day (QID) | ORAL | Status: DC | PRN

## 2012-11-03 MED ORDER — MEPERIDINE HCL 25 MG/ML IJ SOLN: 6.2500 mg | INTRAMUSCULAR | Status: DC | PRN

## 2012-11-03 MED ORDER — ACETAMINOPHEN 10 MG/ML IV SOLN: 1000.0000 mg | Freq: Once | INTRAVENOUS | Status: DC | PRN

## 2012-11-03 MED ORDER — CEFAZOLIN SODIUM-DEXTROSE 2-3 GM-% IV SOLR
INTRAVENOUS | Status: AC
  Administered 2012-11-03: 2 g via INTRAVENOUS
  Filled 2012-11-03: qty 50

## 2012-11-03 MED ORDER — FENTANYL CITRATE 0.05 MG/ML IJ SOLN
INTRAMUSCULAR | Status: DC | PRN
  Administered 2012-11-03: 50 ug via INTRAVENOUS

## 2012-11-03 MED ORDER — PHENYLEPHRINE HCL 10 MG/ML IJ SOLN
INTRAMUSCULAR | Status: DC | PRN
  Administered 2012-11-03: 40 ug via INTRAVENOUS

## 2012-11-03 MED ORDER — 0.9 % SODIUM CHLORIDE (POUR BTL) OPTIME
TOPICAL | Status: DC | PRN
  Administered 2012-11-03: 1000 mL

## 2012-11-03 MED ORDER — MIDAZOLAM HCL 5 MG/5ML IJ SOLN
INTRAMUSCULAR | Status: DC | PRN
  Administered 2012-11-03: 2 mg via INTRAVENOUS

## 2012-11-03 MED ORDER — PROMETHAZINE HCL 25 MG/ML IJ SOLN: 6.2500 mg | INTRAMUSCULAR | Status: DC | PRN

## 2012-11-03 MED ORDER — ROPIVACAINE HCL 5 MG/ML IJ SOLN
INTRAMUSCULAR | Status: DC | PRN
  Administered 2012-11-03: 30 mL via EPIDURAL

## 2012-11-03 MED ORDER — ONDANSETRON HCL 4 MG/2ML IJ SOLN
INTRAMUSCULAR | Status: DC | PRN
  Administered 2012-11-03: 4 mg via INTRAVENOUS

## 2012-11-03 MED ORDER — MIDAZOLAM HCL 2 MG/2ML IJ SOLN
1.0000 mg | INTRAMUSCULAR | Status: DC | PRN
  Administered 2012-11-03: 2 mg via INTRAVENOUS

## 2012-11-03 MED ORDER — GLYCOPYRROLATE 0.2 MG/ML IJ SOLN
INTRAMUSCULAR | Status: DC | PRN
  Administered 2012-11-03: 0.4 mg via INTRAVENOUS

## 2012-11-03 MED ORDER — METHOCARBAMOL 500 MG PO TABS: 500.0000 mg | ORAL_TABLET | Freq: Four times a day (QID) | ORAL | Status: DC | PRN

## 2012-11-03 MED ORDER — HYDROMORPHONE HCL PF 1 MG/ML IJ SOLN: 0.2500 mg | INTRAMUSCULAR | Status: DC | PRN

## 2012-11-03 MED ORDER — LACTATED RINGERS IV SOLN
INTRAVENOUS | Status: DC | PRN
  Administered 2012-11-03 (×2): via INTRAVENOUS

## 2012-11-03 MED ORDER — MUPIROCIN 2 % EX OINT
TOPICAL_OINTMENT | CUTANEOUS | Status: AC
  Filled 2012-11-03: qty 22

## 2012-11-03 MED ORDER — NEOSTIGMINE METHYLSULFATE 1 MG/ML IJ SOLN
INTRAMUSCULAR | Status: DC | PRN
  Administered 2012-11-03: 3.5 mg via INTRAVENOUS

## 2012-11-03 MED ORDER — FENTANYL CITRATE 0.05 MG/ML IJ SOLN: 25.0000 ug | INTRAMUSCULAR | Status: DC | PRN

## 2012-11-03 MED ORDER — ROCURONIUM BROMIDE 100 MG/10ML IV SOLN
INTRAVENOUS | Status: DC | PRN
  Administered 2012-11-03: 50 mg via INTRAVENOUS

## 2012-11-03 MED ORDER — MUPIROCIN 2 % EX OINT
TOPICAL_OINTMENT | Freq: Two times a day (BID) | CUTANEOUS | Status: DC
  Filled 2012-11-03: qty 22

## 2012-11-03 MED ORDER — OXYCODONE HCL 5 MG/5ML PO SOLN: 5.0000 mg | Freq: Once | ORAL | Status: DC | PRN

## 2012-11-03 MED ORDER — PROPOFOL 10 MG/ML IV BOLUS
INTRAVENOUS | Status: DC | PRN
  Administered 2012-11-03: 150 mg via INTRAVENOUS
  Administered 2012-11-03 (×2): 10 mg via INTRAVENOUS

## 2012-11-03 MED ORDER — LACTATED RINGERS IV SOLN
INTRAVENOUS | Status: DC
Start: 2012-11-03 — End: 2012-11-03
  Administered 2012-11-03: 11:00:00 via INTRAVENOUS

## 2012-11-03 MED ORDER — OXYCODONE HCL 5 MG PO TABS: 5.0000 mg | ORAL_TABLET | Freq: Once | ORAL | Status: DC | PRN

## 2012-11-03 MED ORDER — OXYCODONE HCL 5 MG PO TABS: 5.0000 mg | ORAL_TABLET | ORAL | Status: DC | PRN

## 2012-11-03 MED ORDER — MIDAZOLAM HCL 2 MG/2ML IJ SOLN
INTRAMUSCULAR | Status: AC
  Filled 2012-11-03: qty 2

## 2012-11-03 MED ORDER — FENTANYL CITRATE 0.05 MG/ML IJ SOLN: INTRAMUSCULAR | Status: DC | PRN

## 2012-11-03 MED ORDER — LIDOCAINE HCL (CARDIAC) 20 MG/ML IV SOLN
INTRAVENOUS | Status: DC | PRN
  Administered 2012-11-03: 50 mg via INTRAVENOUS

## 2012-11-03 MED ORDER — FENTANYL CITRATE 0.05 MG/ML IJ SOLN
INTRAMUSCULAR | Status: AC
  Administered 2012-11-03: 50 ug
  Filled 2012-11-03: qty 2

## 2012-11-03 SURGICAL SUPPLY — 60 items
BANDAGE ELASTIC 4 VELCRO ST LF (GAUZE/BANDAGES/DRESSINGS) ×2 IMPLANT
BIT DRILL 2 FAST STEP (BIT) ×1 IMPLANT
BIT DRILL 2.5X4 QC (BIT) ×1 IMPLANT
BLADE AVERAGE 25X9 (BLADE) ×2 IMPLANT
BLADE SURG ROTATE 9660 (MISCELLANEOUS) ×2 IMPLANT
BNDG CMPR 9X4 STRL LF SNTH (GAUZE/BANDAGES/DRESSINGS) ×1
BNDG ESMARK 4X9 LF (GAUZE/BANDAGES/DRESSINGS) ×2 IMPLANT
BRUSH SCRUB DISP (MISCELLANEOUS) ×4 IMPLANT
CLOTH BEACON ORANGE TIMEOUT ST (SAFETY) ×2 IMPLANT
COVER SURGICAL LIGHT HANDLE (MISCELLANEOUS) ×4 IMPLANT
DECANTER SPIKE VIAL GLASS SM (MISCELLANEOUS) IMPLANT
DRAPE C-ARMOR (DRAPES) ×2 IMPLANT
DRAPE OEC MINIVIEW 54X84 (DRAPES) ×2 IMPLANT
DRSG EMULSION OIL 3X3 NADH (GAUZE/BANDAGES/DRESSINGS) ×2 IMPLANT
ELECT REM PT RETURN 9FT ADLT (ELECTROSURGICAL) ×2
ELECTRODE REM PT RTRN 9FT ADLT (ELECTROSURGICAL) ×1 IMPLANT
GLOVE BIO SURGEON STRL SZ7.5 (GLOVE) ×2 IMPLANT
GLOVE BIO SURGEON STRL SZ8 (GLOVE) ×2 IMPLANT
GLOVE BIOGEL PI IND STRL 7.5 (GLOVE) ×1 IMPLANT
GLOVE BIOGEL PI IND STRL 8 (GLOVE) ×1 IMPLANT
GLOVE BIOGEL PI INDICATOR 7.5 (GLOVE) ×1
GLOVE BIOGEL PI INDICATOR 8 (GLOVE) ×1
GOWN PREVENTION PLUS XLARGE (GOWN DISPOSABLE) ×2 IMPLANT
GOWN STRL NON-REIN LRG LVL3 (GOWN DISPOSABLE) ×4 IMPLANT
KIT BASIN OR (CUSTOM PROCEDURE TRAY) ×2 IMPLANT
KIT ROOM TURNOVER OR (KITS) ×2 IMPLANT
MANIFOLD NEPTUNE II (INSTRUMENTS) ×2 IMPLANT
NDL HYPO 25GX1X1/2 BEV (NEEDLE) IMPLANT
NEEDLE HYPO 25GX1X1/2 BEV (NEEDLE) ×2 IMPLANT
NS IRRIG 1000ML POUR BTL (IV SOLUTION) ×2 IMPLANT
PACK ORTHO EXTREMITY (CUSTOM PROCEDURE TRAY) ×2 IMPLANT
PAD ARMBOARD 7.5X6 YLW CONV (MISCELLANEOUS) ×4 IMPLANT
PAD CAST 3X4 CTTN HI CHSV (CAST SUPPLIES) ×1 IMPLANT
PADDING CAST COTTON 3X4 STRL (CAST SUPPLIES) ×2
PEG SUBCHONDRAL SMOOTH 2.0X18 (Peg) ×2 IMPLANT
PEG SUBCHONDRAL SMOOTH 2.0X22 (Peg) ×3 IMPLANT
PEG SUBCHONDRAL SMOOTH 2.0X24 (Peg) ×1 IMPLANT
PEG THREADED 2.5MMX22MM LONG (Peg) ×1 IMPLANT
PEG THREADED 2.5MMX24MM LONG (Peg) ×1 IMPLANT
PLATE SHORT 24.4X51.3 LT (Plate) ×1 IMPLANT
SCREW BN 12X3.5XNS CORT TI (Screw) IMPLANT
SCREW BN 13X3.5XNS CORT TI (Screw) IMPLANT
SCREW CORT 3.5X12 (Screw) ×4 IMPLANT
SCREW CORT 3.5X13 (Screw) ×2 IMPLANT
SCREW PEG 2.5X24 NONLOCK (Screw) ×1 IMPLANT
SPONGE GAUZE 4X4 12PLY (GAUZE/BANDAGES/DRESSINGS) ×2 IMPLANT
SPONGE SCRUB IODOPHOR (GAUZE/BANDAGES/DRESSINGS) ×2 IMPLANT
STAPLER VISISTAT 35W (STAPLE) ×2 IMPLANT
SUCTION FRAZIER TIP 10 FR DISP (SUCTIONS) ×2 IMPLANT
SUT ETHILON 3 0 PS 1 (SUTURE) ×4 IMPLANT
SUT VIC AB 0 CT2 27 (SUTURE) ×4 IMPLANT
SUT VIC AB 2-0 CT3 27 (SUTURE) IMPLANT
SUT VIC AB 2-0 SH 27 (SUTURE) ×4
SUT VIC AB 2-0 SH 27XBRD (SUTURE) ×2 IMPLANT
SYR CONTROL 10ML LL (SYRINGE) ×1 IMPLANT
TOWEL OR 17X24 6PK STRL BLUE (TOWEL DISPOSABLE) ×2 IMPLANT
TOWEL OR 17X26 10 PK STRL BLUE (TOWEL DISPOSABLE) ×4 IMPLANT
TUBE CONNECTING 12X1/4 (SUCTIONS) ×2 IMPLANT
UNDERPAD 30X30 INCONTINENT (UNDERPADS AND DIAPERS) ×2 IMPLANT
WATER STERILE IRR 1000ML POUR (IV SOLUTION) ×2 IMPLANT

## 2012-11-03 NOTE — H&P (Signed)
[facility] H&P  Pt well known to [facility] after sustaining a fall back on 10/11/2012 which resulted in a R tibial plateau fx which need to be repaired.  She was discharged without complication.  While recovering she sustained another fall which resulted in a L distal radius fx.  Pt now presents for fixation.  No change in historical or clinical data other than L wrist pain.   Please see consult note (below) that was performed on 10/12/2012, pt was also seen in the office last week for pre-op eval.  No acute changes were noted in her physical exam other than the L wrist fracture.            Plan  OR for ORIF L distal radius  Admit for pain control and therapy eval. The presence of the R tibial plateau fx may make mobilization difficult and pt may need short term SNF as she is still NWB on the R leg and will be NWB on L wrist  Mearl Latin, PA-C [facility] [phone] (P) 11/03/2012 11:38 AM       [facility] Consult   Reason for Consult: Right tibial plateau fracture Referring Physician: D. Shon Baton, MD   HPI: Patient is a 62 year old Caucasian female who is at work yesterday when she tripped. Patient landed on her right knee. Patient attempted to grab hold of a conveyor belt to hold her up but this was unsuccessful. Patient was unable to get up and ambulate. She was brought [facility] evaluation where she was found to have a right lateral tibial plateau fracture. Patient was then transferred to [facility] for admission for observation as well as for consultation by the orthopedic trauma service. Currently patient is in 5 N. room 19 she is in bed and resting comfortably. She denies any numbness or tingling in her right leg. Denies any injuries elsewhere. No headaches, dizziness, lightheadedness, shortness of breath, chest pain, palpitations, abdominal pain. Patient is also about one year status post repair of a complex patella  fracture after a fall as well. This was on the right side as well     Past Medical History   Diagnosis  Date   .  Diabetes mellitus     .  Hypertension     .  Dyslipidemia     .  Obesity         Past Surgical History   Procedure  Date   .  Total abdominal hysterectomy w/ bilateral salpingoophorectomy         fibroids   .  Fractured patella repair  02/2011       Dr. Thomasena Edis; R knee     No family history on file.  Social History: reports that she has never smoked. She has never used smokeless tobacco. She reports that she does not drink alcohol or use illicit drugs.  Allergies: No Known Allergies  Medications:  I have reviewed the patient's current medications. Prior to Admission:   Prescriptions prior to admission   Medication  Sig  Dispense  Refill   .  aspirin EC 81 MG tablet  Take 81 mg by mouth daily.         Marland Kitchen  atorvastatin (LIPITOR) 10 MG tablet  Take 1 tablet (10 mg total) by mouth daily.   90 tablet   3   .  calcium carbonate (OS-CAL) 600 MG TABS  Take 600 mg by mouth 2 (two) times daily with  a meal.           .  Cholecalciferol (VITAMIN D) 2000 UNITS tablet  Take 2,000 Units by mouth daily.         Marland Kitchen  KRILL OIL 1000 MG CAPS  Take 1,000 mg by mouth every evening.         Marland Kitchen  lisinopril-hydrochlorothiazide (PRINZIDE,ZESTORETIC) 20-12.5 MG per tablet  Take 1 tablet by mouth daily.   90 tablet   3   .  Multiple Vitamins-Minerals (MULTIVITAMIN WITH MINERALS) tablet  Take 1 tablet by mouth daily.          .  pioglitazone (ACTOS) 30 MG tablet  Take 1 tablet (30 mg total) by mouth daily.   90 tablet   1   .  glucose blood test strip  USE AS DIRECTED   100 each   PRN   .  ONE TOUCH LANCETS MISC  USE AS DIRECTED   200 each   PRN    Scheduled:     .  aspirin EC   81 mg  Oral  Daily   .  atorvastatin   10 mg  Oral  Daily   .   ceFAZolin (ANCEF) IV   2 g  Intravenous  Once   .   ceFAZolin (ANCEF) IV   2 g  Intravenous  Once   .  lisinopril   20 mg  Oral  Daily     And   .   hydrochlorothiazide   12.5 mg  Oral  Daily   .  insulin aspart   0-15 Units  Subcutaneous  TID WC    Continuous:  ZOX:[id number], acetaminophen, morphine injection, ondansetron, ondansetron (ZOFRAN) IV, ondansetron    Results for orders placed during the hospital encounter of 10/11/12 (from the past 48 hour(s))   BASIC METABOLIC PANEL     Status: Abnormal     Collection Time     10/11/12  9:07 PM       Component  Value  Range  Comment     Sodium  140   135 - 145 mEq/L       Potassium  3.6   3.5 - 5.1 mEq/L       Chloride  100   96 - 112 mEq/L       CO2  29   19 - 32 mEq/L       Glucose, Bld  112 (*)  70 - 99 mg/dL       BUN  14   6 - 23 mg/dL       Creatinine, Ser  0.65   0.50 - 1.10 mg/dL       Calcium  9.8   8.4 - 10.5 mg/dL       GFR calc non Af Amer  >90   >90 mL/min       GFR calc Af Amer  >90   >90 mL/min     CBC     Status: Normal     Collection Time     10/11/12  9:07 PM       Component  Value  Range  Comment     WBC  6.7   4.0 - 10.5 K/uL       RBC  3.89   3.87 - 5.11 MIL/uL       Hemoglobin  12.0   12.0 - 15.0 g/dL       HCT  14.7   82.9 - 46.0 %  MCV  93.3   78.0 - 100.0 fL       MCH  30.8   26.0 - 34.0 pg       MCHC  33.1   30.0 - 36.0 g/dL       RDW  16.1   09.6 - 15.5 %       Platelets  206   150 - 400 K/uL     HEMOGLOBIN A1C     Status: Abnormal     Collection Time     10/11/12  9:07 PM       Component  Value  Range  Comment     Hemoglobin A1C  6.6 (*)  <5.7 %       Mean Plasma Glucose  143 (*)  <117 mg/dL     PROTIME-INR     Status: Normal     Collection Time     10/11/12  9:07 PM       Component  Value  Range  Comment     Prothrombin Time  13.3   11.6 - 15.2 seconds       INR  1.02   0.00 - 1.49     GLUCOSE, CAPILLARY     Status: Abnormal     Collection Time     10/11/12 10:22 PM       Component  Value  Range  Comment     Glucose-Capillary  161 (*)  70 - 99 mg/dL     URINALYSIS, ROUTINE W REFLEX MICROSCOPIC     Status: Normal     Collection  Time     10/11/12 10:33 PM       Component  Value  Range  Comment     Color, Urine  YELLOW   YELLOW       APPearance  CLEAR   CLEAR       Specific Gravity, Urine  1.010   1.005 - 1.030       pH  7.0   5.0 - 8.0       Glucose, UA  NEGATIVE   NEGATIVE mg/dL       Hgb urine dipstick  NEGATIVE   NEGATIVE       Bilirubin Urine  NEGATIVE   NEGATIVE       Ketones, ur  NEGATIVE   NEGATIVE mg/dL       Protein, ur  NEGATIVE   NEGATIVE mg/dL       Urobilinogen, UA  0.2   0.0 - 1.0 mg/dL       Nitrite  NEGATIVE   NEGATIVE       Leukocytes, UA  NEGATIVE   NEGATIVE  MICROSCOPIC NOT DONE ON URINES WITH NEGATIVE PROTEIN, BLOOD, LEUKOCYTES, NITRITE, OR GLUCOSE <1000 mg/dL.   GLUCOSE, CAPILLARY     Status: Abnormal     Collection Time     10/12/12  3:24 AM       Component  Value  Range  Comment     Glucose-Capillary  165 (*)  70 - 99 mg/dL     SURGICAL PCR SCREEN     Status: Normal     Collection Time     10/12/12  3:35 AM       Component  Value  Range  Comment     MRSA, PCR  NEGATIVE   NEGATIVE       Staphylococcus aureus  NEGATIVE   NEGATIVE     GLUCOSE, CAPILLARY     Status: Abnormal  Collection Time     10/12/12  8:35 AM       Component  Value  Range  Comment     Glucose-Capillary  129 (*)  70 - 99 mg/dL       Dg Tibia/fibula Right  10/11/2012  *RADIOLOGY REPORT*  Clinical Data: Larey Seat at work, pain from right to knee to right ankle  RIGHT TIBIA AND FIBULA - 2 VIEW  Comparison: Right knee radiographs 10/11/2012  Findings: Osseous demineralization. Again identified depressed and minimally displaced intra-articular fracture of the lateral tibial plateau. Remainder of the right tibia and fibula appear intact. Ankle joint spaces preserved. No additional fracture or dislocation. Plantar and Achilles insertion calcaneal spurs. No definite soft tissue abnormalities.  IMPRESSION: Depressed and minimally displaced intra-articular fracture of the lateral tibial plateau.   Original Report Authenticated  By: Ulyses Southward, M.D.    Dg Ankle Complete Right  10/11/2012  *RADIOLOGY REPORT*  Clinical Data: Larey Seat at work, pain for right medial right ankle  RIGHT ANKLE - COMPLETE 3+ VIEW  Comparison: None  Findings: Osseous demineralization. Ankle mortise intact. No acute fracture, dislocation or bone destruction. Plantar and Achilles insertion calcaneal spur formation.  IMPRESSION: Calcaneal spurring. Osseous demineralization. No acute abnormalities; please refer to the right knee radiograph report for discussion of a lateral tibial plateau fracture.   Original Report Authenticated By: Ulyses Southward, M.D.    Dg Chest Port 1 View  10/11/2012  *RADIOLOGY REPORT*  Clinical Data: Preoperative radiograph  PORTABLE CHEST - 1 VIEW  Comparison: 03/22/2011  Findings: Heart size upper normal to mildly enlarged, similar to prior.  Mediastinal contours otherwise within normal range.  No pleural effusion or pneumothorax.  No confluent airspace opacity. No acute osseous finding.  IMPRESSION: Heart size upper normal to mildly enlarged.  No acute process identified.   Original Report Authenticated By: Jearld Lesch, M.D.    Dg Knee Complete 4 Views Right  10/11/2012  *RADIOLOGY REPORT*  Clinical Data: Right knee injury, pain post fall, unable to bear weight  RIGHT KNEE - COMPLETE 4+ VIEW  Comparison: 07/18/1999  Findings: Osseous demineralization. Mildly depressed and displaced fracture of the lateral tibial plateau. Minimal knee joint effusion. Old post-traumatic deformity of prior patellar fracture. No additional acute fracture or dislocation. Calcifications are seen just anterior to the distal quadriceps tendon, new, question sequela of prior injury.  IMPRESSION: Mildly depressed and displaced fracture involving the lateral tibial plateau. Osseous demineralization. Old healed post-traumatic deformity of the patella.   Original Report Authenticated By: Ulyses Southward, M.D.      Review of Systems  Constitutional: Negative for  fever and chills.  Eyes: Negative for blurred vision.  Respiratory: Negative for shortness of breath and wheezing.   Cardiovascular: Negative for chest pain and palpitations.  Gastrointestinal: Positive for nausea. Negative for vomiting and abdominal pain.  Genitourinary: Negative for dysuria.  Musculoskeletal:        Right knee pain  Neurological: Negative for dizziness, tingling and headaches.   Blood pressure 116/50, pulse 70, temperature 98.5 F (36.9 C), temperature source Oral, resp. rate 16, height 5\' 4"  (1.626 m), weight 98.4 kg (216 lb 14.9 oz), SpO2 93.00%. Physical Exam  Constitutional: She is oriented to person, place, and time. Vital signs are normal. She appears well-developed and well-nourished. She is cooperative. She does not appear ill. No distress.  HENT:   Head: Normocephalic and atraumatic.   Mouth/Throat: Mucous membranes are normal.  Eyes: EOM are normal.  Neck: Normal range of motion  and full passive range of motion without pain. Neck supple. No spinous process tenderness and no muscular tenderness present. Normal range of motion present.  Cardiovascular: Normal rate, regular rhythm, S1 normal and S2 normal.   Respiratory:       Clear to auscultation bilaterally  GI:       Soft, nontender,+ bowel sounds  Musculoskeletal:      Bilateral upper extremities and left lower extremity are unremarkable. Motor and sensory functions are grossly intact palpable pulses  Right lower extremity   Patient in a knee immobilizer   Upon removal of the knee immobilizer skin is stable. No open wounds or lesions noted. Old anterior scar from previous patellar. Noted well-healed   Swelling is stable   Skin wrinkles with gentle compression along the lateral aspect of the proximal tibia   Deep peroneal nerve, superficial peroneal nerve into the nerve sensory functions are grossly intact   EHL, FHL, anterior tibialis, posterior tibialis, peroneals and gastrocsoleus complex motor  function are intact   Palpable dorsalis pedis pulses noted No pain with passive stretch. Compartments are supple, soft and nontender    Right knee is unstable with valgus stress  Neurological: She is alert and oriented to person, place, and time. No sensory deficit.  Psychiatric: She has a normal mood and affect. Her speech is normal and behavior is normal. Judgment and thought content normal. Cognition and memory are normal.    Assessment/Plan:  62 year old female status post fall, mechanical  1. Fall, mechanical 2. right split depression type lateral tibial plateau fracture, Schatzker 2, OTA 41-B3             We will obtain a CT scan to further characterize fracture pattern for surgical planning             We do plan to proceed to the OR later on this afternoon for formal ORIF of this fracture.             Patient will be nonweightbearing for 6 weeks after fixation and will have unrestricted range of motion of her right knee.             we will get her in a hinged knee brace to surgery             Continue with ice and elevation             PT/OT consults after surgery             Would anticipate patient discharge to home with home health when she is able to do so and is mobilizing safely.  3. hypertension and diabetes             Will hold on oral hypoglycemic agents. Start patient on SSI             Monitor blood pressures may need to hold some of her pressure medications 4. metabolic bone workup             Will check a vitamin D panel while patient is here evaluate for any additional causes for a poor bone density. This is likely a primary osteoporosis/osteopenia again patient's age. 5. Pain             Patient have some issues in the past and pain medication resulting in sounds to be a bowel obstruction. We will monitor her narcotic use closely and also make sure she is on a good bowel regimen. 6. DVT/PE prophylaxis  Lovenox postop for 21 days 7. FEN              N.p.o. 8. Disposition             OR today  Mearl Latin, PA-C [facility] [phone] (P)   10/12/2012, 9:17 AM

## 2012-11-03 NOTE — H&P (Signed)
I have seen and examined the patient. I agree with the findings above.  I discussed with the patient the risks and benefits of surgery, including the possibility of infection, nerve injury, vessel injury, wound breakdown, arthritis, symptomatic hardware, DVT/ PE, loss of motion, and need for further surgery among others.  She and her husband understood these risks and wished to proceed.   Budd Palmer, MD 11/03/2012

## 2012-11-03 NOTE — Anesthesia Preprocedure Evaluation (Addendum)
Anesthesia Evaluation  Patient identified by MRN, date of birth, ID band Patient awake    Reviewed: Allergy & Precautions, H&P , NPO status , Patient's Chart, lab work & pertinent test results, reviewed documented beta blocker date and time   Airway Mallampati: I TM Distance: >3 FB Neck ROM: Full    Dental  (+) Dental Advisory Given and Teeth Intact   Pulmonary neg pulmonary ROS,  breath sounds clear to auscultation  Pulmonary exam normal       Cardiovascular hypertension, Pt. on medications Rhythm:Regular Rate:Normal     Neuro/Psych negative neurological ROS  negative psych ROS   GI/Hepatic negative GI ROS, Neg liver ROS,   Endo/Other  diabetes, Well Controlled, Type 2, Oral Hypoglycemic Agents  Renal/GU negative Renal ROS     Musculoskeletal negative musculoskeletal ROS (+)   Abdominal (+) + obese,  Abdomen: soft. Bowel sounds: normal.  Peds  Hematology negative hematology ROS (+)   Anesthesia Other Findings   Reproductive/Obstetrics                        Anesthesia Physical Anesthesia Plan  ASA: II  Anesthesia Plan: General and Regional   Post-op Pain Management:    Induction: Intravenous  Airway Management Planned: Oral ETT  Additional Equipment:   Intra-op Plan:   Post-operative Plan: Extubation in OR  Informed Consent: I have reviewed the patients History and Physical, chart, labs and discussed the procedure including the risks, benefits and alternatives for the proposed anesthesia with the patient or authorized representative who has indicated his/her understanding and acceptance.   Dental advisory given  Plan Discussed with: CRNA  Anesthesia Plan Comments:         Anesthesia Quick Evaluation

## 2012-11-03 NOTE — Anesthesia Postprocedure Evaluation (Signed)
Anesthesia Post Note  Patient: [name]  Procedure(s) Performed: Procedure(s) (LRB): OPEN REDUCTION INTERNAL FIXATION (ORIF) WRIST FRACTURE (Left)  Anesthesia type: General with supraclavicular nerve block  Patient location: PACU  Post pain: Pain level controlled  Post assessment: Post-op Vital signs reviewed  Last Vitals: BP 126/62  Pulse 65  Temp 36.4 C (Oral)  Resp 11  SpO2 96%  Post vital signs: Reviewed  Level of consciousness: sedated  Complications: No apparent anesthesia complications

## 2012-11-03 NOTE — Brief Op Note (Signed)
11/03/2012  2:31 PM  PATIENT:  [name]  62 y.o. female  PRE-OPERATIVE DIAGNOSIS:  LEFT DISTAL RADIUS FRACTURE  POST-OPERATIVE DIAGNOSIS:  LEFT DISTAL RADIUS FRACTURE  PROCEDURE:  Procedure(s) (LRB) with comments: OPEN REDUCTION INTERNAL FIXATION (ORIF) WRIST FRACTURE (Left)  SURGEON:  Surgeon(s) and Role:    * Budd Palmer, MD - Primary  PHYSICIAN ASSISTANT: Montez Morita, [facility]  ANESTHESIA:   general  EBL:  Total I/O In: 1600 [I.V.:1600] Out: 50 [Blood:50]  BLOOD ADMINISTERED:none  DRAINS: none   LOCAL MEDICATIONS USED:  NONE  SPECIMEN:  No Specimen  DISPOSITION OF SPECIMEN:  N/A  COUNTS:  YES  TOURNIQUET:  * No tourniquets in log *  DICTATION: .Other Dictation: Dictation Number [id number]  PLAN OF CARE: Discharge to home after PACU  PATIENT DISPOSITION:  PACU - hemodynamically stable.   Delay start of Pharmacological VTE agent (>24hrs) due to surgical blood loss or risk of bleeding: no

## 2012-11-03 NOTE — Anesthesia Procedure Notes (Addendum)
Anesthesia Regional Block:  Supraclavicular block  Pre-Anesthetic Checklist: ,, timeout performed, Correct Patient, Correct Site, Correct Laterality, Correct Procedure, Correct Position, site marked, Risks and benefits discussed,  Surgical consent,  Pre-op evaluation,  At surgeon's request and post-op pain management  Laterality: Left  Prep: chloraprep       Needles:  Injection technique: Single-shot  Needle Type: Stimiplex     Needle Length: 10cm 10 cm Needle Gauge: 20 and 20 G    Additional Needles:  Procedures: ultrasound guided (picture in chart) and nerve stimulator  Motor weakness within 10 minutes. Supraclavicular block Narrative:  Start time: 11/03/2012 11:16 AM End time: 11/03/2012 11:24 AM Injection made incrementally with aspirations every 5 mL.  Performed by: Personally  Anesthesiologist: [place]  Additional Notes: Risks, benefits and alternative to block explained extensively.  Patient tolerated procedure well, without complications.  Supraclavicular block Procedure Name: Intubation Date/Time: 11/03/2012 12:53 PM Performed by: Ellin Goodie Pre-anesthesia Checklist: Patient identified, Emergency Drugs available, Suction available, Patient being monitored and Timeout performed Patient Re-evaluated:Patient Re-evaluated prior to inductionOxygen Delivery Method: Circle system utilized Preoxygenation: Pre-oxygenation with 100% oxygen Intubation Type: IV induction Ventilation: Mask ventilation without difficulty Laryngoscope Size: Mac and 3 Grade View: Grade I Tube type: Oral Tube size: 7.5 mm Number of attempts: 1 Airway Equipment and Method: Stylet Placement Confirmation: ETT inserted through vocal cords under direct vision,  positive ETCO2 and breath sounds checked- equal and bilateral Secured at: 22 cm Tube secured with: Tape Dental Injury: Teeth and Oropharynx as per pre-operative assessment

## 2012-11-03 NOTE — Transfer of Care (Signed)
Immediate Anesthesia Transfer of Care Note  Patient: [name]  Procedure(s) Performed: Procedure(s) (LRB) with comments: OPEN REDUCTION INTERNAL FIXATION (ORIF) WRIST FRACTURE (Left)  Patient Location: PACU  Anesthesia Type:General  Level of Consciousness: awake, alert  and oriented  Airway & Oxygen Therapy: Patient connected to face mask  Post-op Assessment: Report given to PACU RN  Post vital signs: stable  Complications: No apparent anesthesia complications

## 2012-11-04 ENCOUNTER — Encounter (HOSPITAL_COMMUNITY): Payer: Self-pay | Admitting: Orthopedic Surgery

## 2012-11-04 MED FILL — Mupirocin Oint 2%: CUTANEOUS | Qty: 22 | Status: AC

## 2012-11-04 NOTE — Op Note (Signed)
[name], [name]               ACCOUNT NO.:  [id number]  MEDICAL RECORD NO.:  [id number]  LOCATION:  [facility]                         FACILITY:  [facility]  PHYSICIAN:  Doralee Albino. Carola Frost, M.D. DATE OF BIRTH:  12-23-1950  DATE OF PROCEDURE:  11/03/2012 DATE OF DISCHARGE:  11/03/2012                              OPERATIVE REPORT   PREOPERATIVE DIAGNOSIS:  Displaced angulated left distal radius fracture with dorsal comminution.  POSTOPERATIVE DIAGNOSIS:  Displaced angulated left distal radius fracture with dorsal comminution.  PROCEDURE:  ORIF of low left distal radius.  SURGEON:  Doralee Albino. Carola Frost, MD  ASSISTANT:  Mearl Latin, PAC.  ANESTHESIA:  General.  COMPLICATIONS:  None.  TOURNIQUET:  None.  DISPOSITION:  To PACU.  CONDITION:  Stable.  BRIEF SUMMARY AND INDICATION FOR PROCEDURE:  [name] is a 62- year-old female who sustained a tibial plateau fracture that underwent ORIF.  She was struggling with mobilization using assistive devices, and tried to maintain nonweightbearing when she lost her balance and proceeded to fall with onset of left wrist pain and mild deformity.  X- rays demonstrated a large degree of dorsal tilt of the articular surface with significant comminution and shortening along the dorsal cortex.  I did discuss with her the risks and benefits of attempted closed manipulation versus ORIF.  She understood those risks clearly and did wish to proceed with ORIF, which would more reliably restore and maintain alignment.  She understood the risks to include infection, nerve injury, vessel injury, symptomatic hardware, malunion, nonunion, heart attack, stroke, other anesthetic complications, and she did wish to proceed.  BRIEF DESCRIPTION OF PROCEDURE:  Ms. Outen was given Ancef preoperatively, taken to the operating room, where general anesthesia was induced.  Her left upper extremity was prepped and draped in the sterile fashion.  Tourniquet was  placed about the left upper arm, but never inflated during the procedure.  A standard volar Sherilyn Cooter approach was made retracting some bridging veins distally incising the FCR tendon sheath retracting the tendon and incising the deep component of the sheath retracting the deep tissues to expose the pronator, which was released near its insertion radially.  The muscle belly was swept ulnarly.  Hohmann retractor was placed to provide exposure and then provisional placement of the plate made.  The plate was left off the distal metaphysis.  I met a  30 degrees angle to try to restore appropriate tilt.  First K-wires were placed and, the position of the plate checked on AP and lateral imaging.  Plate position was fine tuned and then additional K-wires followed by pegs and threaded screws was placed using the threaded screws first to maximally appose the plate to the bone.  The metaphysis was then brought down with 3 cortical screws. Final images showed appropriate restoration of length, inclination, and alignment.  The wound was irrigated thoroughly and closed in standard layered fashion using 0 Vicryl, 3-0 Vicryl, 4-0 nylon.  Sterile gentle compressive dressing was then applied and a volar splint.  The patient was awakened from anesthesia and transported to PACU in stable condition.  Montez Morita, PA-C did assist me throughout the procedure and provided retraction to protect the  neurovascular bundle, assistance with maintaining the reduction and also with wound closure.  PROGNOSIS:  Ms. Granberry will be nonweightbearing with early active digital motion, elevation.  We anticipate removing her sutures at the 2- week mark, possible conversion given her lower extremity injury with a removable brace, anticipated around 4-5 weeks with motion at that time. She can be weightbearing as tolerated to the elbow with a platform walker.     Doralee Albino. Carola Frost, M.D.     [facility]/MEDQ  D:  11/03/2012  T:   11/04/2012  Job:  [id number]

## 2012-11-07 ENCOUNTER — Encounter: Payer: Self-pay | Admitting: Family Medicine

## 2012-11-07 ENCOUNTER — Ambulatory Visit (INDEPENDENT_AMBULATORY_CARE_PROVIDER_SITE_OTHER): Payer: BC Managed Care – PPO | Admitting: Family Medicine

## 2012-11-07 VITALS — BP 122/76

## 2012-11-07 DIAGNOSIS — E119 Type 2 diabetes mellitus without complications: Secondary | ICD-10-CM

## 2012-11-07 DIAGNOSIS — S82143A Displaced bicondylar fracture of unspecified tibia, initial encounter for closed fracture: Secondary | ICD-10-CM

## 2012-11-07 DIAGNOSIS — E1159 Type 2 diabetes mellitus with other circulatory complications: Secondary | ICD-10-CM

## 2012-11-07 DIAGNOSIS — E785 Hyperlipidemia, unspecified: Secondary | ICD-10-CM

## 2012-11-07 DIAGNOSIS — E1169 Type 2 diabetes mellitus with other specified complication: Secondary | ICD-10-CM

## 2012-11-07 DIAGNOSIS — J309 Allergic rhinitis, unspecified: Secondary | ICD-10-CM

## 2012-11-07 DIAGNOSIS — I10 Essential (primary) hypertension: Secondary | ICD-10-CM

## 2012-11-07 DIAGNOSIS — E669 Obesity, unspecified: Secondary | ICD-10-CM

## 2012-11-07 DIAGNOSIS — J302 Other seasonal allergic rhinitis: Secondary | ICD-10-CM

## 2012-11-07 LAB — POCT GLYCOSYLATED HEMOGLOBIN (HGB A1C): Hemoglobin A1C: 6.4

## 2012-11-07 NOTE — Progress Notes (Signed)
  Subjective:    Patient ID: [name], female    DOB: 05-20-1951, 62 y.o.   MRN: [id number]  HPI He is here for a followup visit for her diabetes. Since last being seen she has had a right tibial plateau fracture and left wrist fracture. She is unable to place any weight on her legs but is moving them. She was on Lovenox. She exercises her leg every 2 hours. She does have some range of motion. Her left hand is in a sling she has minimal wrist motion at this time. She goes back to see her orthopedic surgeon on the 22nd. She does check her blood sugars. Smoking and drinking were reviewed. Her physical activity is obviously limited. Feet are not an issue right now. Eye exam was done in the last 6 months.   Review of Systems     Objective:   Physical Exam Alert and in no distress. Left arm is in a sling. She does move her fingers slightly. Right leg has a brace on it she does have some motion in it. Hb A1c is 6.4       Assessment & Plan:   1. Diabetes mellitus  POCT glycosylated hemoglobin (Hb A1C)  2. Hypertension associated with diabetes    3. Hyperlipidemia LDL goal <70    4. Obesity (BMI 30-39.9)    5. Allergic rhinitis, seasonal    6. Tibial plateau fracture     continue on present medication regimen. Encouraged her to become as physically active as possible. Encouraged her to move the right knee as directed by her surgeon. Recheck here in one month. Told her not to worsen much about her diabetes as she is doing quite well with this.

## 2012-11-11 ENCOUNTER — Telehealth: Payer: Self-pay | Admitting: Family Medicine

## 2012-11-11 MED ORDER — ATORVASTATIN CALCIUM 10 MG PO TABS: 10.0000 mg | ORAL_TABLET | Freq: Every day | ORAL | Status: DC

## 2012-11-11 MED ORDER — LISINOPRIL-HYDROCHLOROTHIAZIDE 20-12.5 MG PO TABS: 1.0000 | ORAL_TABLET | Freq: Every day | ORAL | Status: DC

## 2012-11-11 NOTE — Telephone Encounter (Signed)
Sent in meds 

## 2013-01-22 ENCOUNTER — Ambulatory Visit (INDEPENDENT_AMBULATORY_CARE_PROVIDER_SITE_OTHER): Payer: BC Managed Care – PPO | Admitting: Family Medicine

## 2013-01-22 ENCOUNTER — Encounter: Payer: Self-pay | Admitting: Family Medicine

## 2013-01-22 VITALS — BP 128/82 | HR 76 | Ht 64.25 in | Wt 212.0 lb

## 2013-01-22 DIAGNOSIS — L304 Erythema intertrigo: Secondary | ICD-10-CM

## 2013-01-22 DIAGNOSIS — L538 Other specified erythematous conditions: Secondary | ICD-10-CM

## 2013-01-22 DIAGNOSIS — L219 Seborrheic dermatitis, unspecified: Secondary | ICD-10-CM

## 2013-01-22 NOTE — Progress Notes (Signed)
Chief Complaint  Patient presents with  . red spot in groin area    red spot in groin, pt states its about 4-5 inches long and linking,   . ear infection    outside of ear,   Complains of a rash at left groin.  Noticed burning pain while in the shower this morning.  She had been at therapy in jeans yesterday, thinks maybe it rubbed. The area had gotten sweaty, moist.  Denies itching.    No new detergents, softeners or other exposures.  Also complains of ongoing problem with the skin on both ears, intermittently.  Gets itchy and scaly. Sometimes weeps No h/o dandruff.  Sometimes gets a similar problem at her medial eyebrows, but is worse at the ears.  Lab Results  Component Value Date   HGBA1C 6.4 11/07/2012    Past Medical History  Diagnosis Date  . Diabetes mellitus   . Hypertension   . Dyslipidemia   . Obesity    Past Surgical History  Procedure Laterality Date  . Total abdominal hysterectomy w/ bilateral salpingoophorectomy      fibroids  . Fractured patella repair  02/2011    Dr. Thomasena Edis; R knee  . Orif tibia plateau  10/12/2012    Procedure: OPEN REDUCTION INTERNAL FIXATION (ORIF) TIBIAL PLATEAU;  Surgeon: Budd Palmer, MD;  Location: [facility] OR;  Service: Orthopedics;  Laterality: Right;  . Abdominal hysterectomy    . Orif tibia fracture    . Orif wrist fracture  11/03/2012    Procedure: OPEN REDUCTION INTERNAL FIXATION (ORIF) WRIST FRACTURE;  Surgeon: Budd Palmer, MD;  Location: [facility] OR;  Service: Orthopedics;  Laterality: Left;   History   Social History  . Marital Status: Married    Spouse Name: N/A    Number of Children: 2  . Years of Education: N/A   Occupational History  . shipping    Social History Main Topics  . Smoking status: Never Smoker   . Smokeless tobacco: Never Used  . Alcohol Use: No  . Drug Use: No  . Sexually Active: Yes   Other Topics Concern  . Not on file   Social History Narrative  . No narrative on file   Current Outpatient  Prescriptions on File Prior to Visit  Medication Sig Dispense Refill  . acetaminophen (TYLENOL) 325 MG tablet Take 325-650 mg by mouth every 6 (six) hours as needed. pain      . aspirin EC 81 MG tablet Take 81 mg by mouth daily.      Marland Kitchen atorvastatin (LIPITOR) 10 MG tablet Take 1 tablet (10 mg total) by mouth daily.  90 tablet  3  . calcium carbonate (OS-CAL) 600 MG TABS Take 600 mg by mouth 2 (two) times daily with a meal.        . Cholecalciferol (VITAMIN D) 2000 UNITS tablet Take 2,000 Units by mouth daily.      Tery Sanfilippo Sodium (DSS) 100 MG CAPS Take 100 mg by mouth 2 (two) times daily as needed. For constipation      . glucose blood test strip USE AS DIRECTED  100 each  PRN  . KRILL OIL 1000 MG CAPS Take 1,000 mg by mouth every evening.      Marland Kitchen lisinopril-hydrochlorothiazide (PRINZIDE,ZESTORETIC) 20-12.5 MG per tablet Take 1 tablet by mouth daily.  90 tablet  3  . methocarbamol (ROBAXIN) 500 MG tablet Take 1-2 tablets (500-1,000 mg total) by mouth every 6 (six) hours as needed (spasm).  80 tablet  1  . Multiple Vitamins-Minerals (MULTIVITAMIN WITH MINERALS) tablet Take 1 tablet by mouth daily.       . ONE TOUCH LANCETS MISC USE AS DIRECTED  200 each  PRN  . oxyCODONE (OXY IR/ROXICODONE) 5 MG immediate release tablet Take 1-2 tablets (5-10 mg total) by mouth every 4 (four) hours as needed (breakthrough pain).  60 tablet  0  . oxyCODONE-acetaminophen (PERCOCET/ROXICET) 5-325 MG per tablet Take 1-2 tablets by mouth every 6 (six) hours as needed for pain.  100 tablet  0  . pioglitazone (ACTOS) 30 MG tablet Take 1 tablet (30 mg total) by mouth daily.  90 tablet  1  . polyethylene glycol (MIRALAX / GLYCOLAX) packet Take 17 g by mouth daily as needed. For constipation        No current facility-administered medications on file prior to visit.    No Known Allergies  ROS:  Denies fevers, URI symptoms, cough, shortness of breath, GI complaints, other skin lesions/rashes or other complaints except  per HPI.  PHYSICAL EXAM: BP 128/82  Pulse 76  Ht 5' 4.25" (1.632 m)  Wt 212 lb (96.163 kg)  BMI 36.11 kg/m2 Well developed, peasant female in no distress  L groin--erythema at skin fold.  Skin is not macerated.  Erythema is uniform, no raised edges or satellite pustules.  No significant weeping  L earlobe--at area of piercing there is thickening, scaling.  Slight crusting posteriorly, slightly weepy.  No significant spreading erythema or warmth.  At R ear, where pinna attaches to scalp there is some erythema and scaling, lots of dandruff in hair in this area  ASSESSMENT/PLAN:  Seborrheic dermatitis  Intertrigo DM is well controlled.  Mild intertrigo L groin. Keep skin dry.  Loose fitting clothing.  Cotton underwear.  You might feel better using a barrier such as zinc oxide or desitin (diaper rash creams). You can also use either baby powder or corn starch powder to help keep the area dry.  Seborrheic dermatitis.   Use antibacterial ointment to left earlobe until crusting/weeping resolved.  Use either hydrocortisone cream twice daily to the affected areas of your skin (as needed for these recurrent rashes), and/or you can try washing these areas with a dandruff shampoo or antifungal shampoo.   Return if increasing redness, swelling, weeping, fevers, or other concerns develop

## 2013-01-22 NOTE — Patient Instructions (Signed)
Keep skin dry.  Loose fitting clothing.  Cotton underwear.  You might feel better using a barrier such as zinc oxide or desitin (diaper rash creams). You can also use either baby powder or corn starch powder to help keep the area dry.  Seborrheic dermatitis.   Use antibacterial ointment to left earlobe until crusting/weeping resolved.  Use either hydrocortisone cream twice daily to the affected areas of your skin (as needed for these recurrent rashes), and/or you can try washing these areas with a dandruff shampoo or antifungal shampoo.   Return if increasing redness, swelling, weeping, fevers, or other concerns develop   Intertrigo Intertrigo is a skin condition that occurs in between folds of skin in places on the body that rub together a lot and do not get much ventilation. It is caused by heat, moisture, friction, sweat retention, and lack of air circulation, which produces red, irritated patches and, sometimes, scaling or drainage. People who have diabetes, who are obese, or who have treatment with antibiotics are at increased risk for intertrigo. The most common sites for intertrigo to occur include:  The groin.  The breasts.  The armpits.  Folds of abdominal skin.  Webbed spaces between the fingers or toes. Intertrigo may be aggravated by:  Sweat.  Feces.  Yeast or bacteria that are present near skin folds.  Urine.  Vaginal discharge. HOME CARE INSTRUCTIONS  The following steps can be taken to reduce friction and keep the affected area cool and dry:  Expose skin folds to the air.  Keep deep skin folds separated with cotton or linen cloth. Avoid tight fitting clothing that could cause chafing.  Wear open-toed shoes or sandals to help reduce moisture between the toes.  Apply absorbent powders to affected areas as directed by your caregiver.  Apply over-the-counter barrier pastes, such as zinc oxide, as directed by your caregiver.  If you develop a fungal  infection in the affected area, your caregiver may have you use antifungal creams. SEEK MEDICAL CARE IF:   The rash is not improving after 1 week of treatment.  The rash is getting worse (more red, more swollen, more painful, or spreading).  You have a fever or chills. MAKE SURE YOU:   Understand these instructions.  Will watch your condition.  Will get help right away if you are not doing well or get worse. Document Released: 10/08/2005 Document Revised: 12/31/2011 Document Reviewed: 03/23/2010 [facility] Patient Information 2013 [place], [place].   Seborrheic Dermatitis Seborrheic dermatitis involves pink or red skin with greasy, flaky scales. This is often found on the scalp, eyebrows, nose, bearded area, and on or behind the ears. It can also occur on the central chest. It often occurs where there are more oil (sebaceous) glands. This condition is also known as dandruff. When this condition affects a baby's scalp, it is called cradle cap. It may come and go for no known reason. It can occur at any time of life from infancy to old age. CAUSES  The cause is unknown. It is not the result of too little moisture or too much oil. In some people, seborrheic dermatitis flare-ups seem to be triggered by stress. It also commonly occurs in people with certain diseases such as Parkinson's disease or HIV/AIDS. SYMPTOMS   Thick scales on the scalp.  Redness on the face or in the armpits.  The skin may seem oily or dry, but moisturizers do not help.  In infants, seborrheic dermatitis appears as scaly redness that does not seem  to bother the baby. In some babies, it affects only the scalp. In others, it also affects the neck creases, armpits, groin, or behind the ears.  In adults and adolescents, seborrheic dermatitis may affect only the scalp. It may look patchy or spread out, with areas of redness and flaking. Other areas commonly affected include:  Eyebrows.  Eyelids.  Forehead.  Skin  behind the ears.  Outer ears.  Chest.  Armpits.  Nose creases.  Skin creases under the breasts.  Skin between the buttocks.  Groin.  Some adults and adolescents feel itching or burning in the affected areas. DIAGNOSIS  Your caregiver can usually tell what the problem is by doing a physical exam. TREATMENT   Cortisone (steroid) ointments, creams, and lotions can help decrease inflammation.  Babies can be treated with baby oil to soften the scales, then they may be washed with baby shampoo. If this does not help, a prescription topical steroid medicine may work.  Adults can use medicated shampoos.  Your caregiver may prescribe corticosteroid cream and shampoo containing an antifungal or yeast medicine (ketoconazole). Hydrocortisone or anti-yeast cream can be rubbed directly onto seborrheic dermatitis patches. Yeast does not cause seborrheic dermatitis, but it seems to add to the problem. In infants, seborrheic dermatitis is often worst during the first year of life. It tends to disappear on its own as the child grows. However, it may return during the teenage years. In adults and adolescents, seborrheic dermatitis tends to be a long-lasting condition that comes and goes over many years. HOME CARE INSTRUCTIONS   Use prescribed medicines as directed.  In infants, do not aggressively remove the scales or flakes on the scalp with a comb or by other means. This may lead to hair loss. SEEK MEDICAL CARE IF:   The problem does not improve from the medicated shampoos, lotions, or other medicines given by your caregiver.  You have any other questions or concerns. Document Released: 10/08/2005 Document Revised: 04/08/2012 Document Reviewed: 02/27/2010 [facility] Patient Information 2013 [place], [place].

## 2013-01-27 ENCOUNTER — Telehealth: Payer: Self-pay | Admitting: Internal Medicine

## 2013-01-27 DIAGNOSIS — E119 Type 2 diabetes mellitus without complications: Secondary | ICD-10-CM

## 2013-01-27 MED ORDER — PIOGLITAZONE HCL 30 MG PO TABS: 30.0000 mg | ORAL_TABLET | Freq: Every day | ORAL | Status: DC

## 2013-01-27 NOTE — Telephone Encounter (Signed)
Sent in [employer]

## 2013-03-10 ENCOUNTER — Ambulatory Visit (INDEPENDENT_AMBULATORY_CARE_PROVIDER_SITE_OTHER): Payer: BC Managed Care – PPO | Admitting: Family Medicine

## 2013-03-10 ENCOUNTER — Encounter: Payer: Self-pay | Admitting: Family Medicine

## 2013-03-10 VITALS — BP 122/82 | HR 70 | Wt 211.0 lb

## 2013-03-10 DIAGNOSIS — E1159 Type 2 diabetes mellitus with other circulatory complications: Secondary | ICD-10-CM

## 2013-03-10 DIAGNOSIS — S82141S Displaced bicondylar fracture of right tibia, sequela: Secondary | ICD-10-CM

## 2013-03-10 DIAGNOSIS — E1169 Type 2 diabetes mellitus with other specified complication: Secondary | ICD-10-CM

## 2013-03-10 DIAGNOSIS — I10 Essential (primary) hypertension: Secondary | ICD-10-CM

## 2013-03-10 DIAGNOSIS — E119 Type 2 diabetes mellitus without complications: Secondary | ICD-10-CM

## 2013-03-10 DIAGNOSIS — E785 Hyperlipidemia, unspecified: Secondary | ICD-10-CM

## 2013-03-10 DIAGNOSIS — E669 Obesity, unspecified: Secondary | ICD-10-CM

## 2013-03-10 LAB — POCT GLYCOSYLATED HEMOGLOBIN (HGB A1C): Hemoglobin A1C: 6.5
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
## 2013-05-11 IMAGING — CR DG WRIST 2V*L*
2 series · 2 of 2 positions shown · non-contrast
Comparison: Multiple priors.

CLINICAL DATA: Postop

LEFT WRIST - 2 VIEW

[view not recorded (1 of 2)]
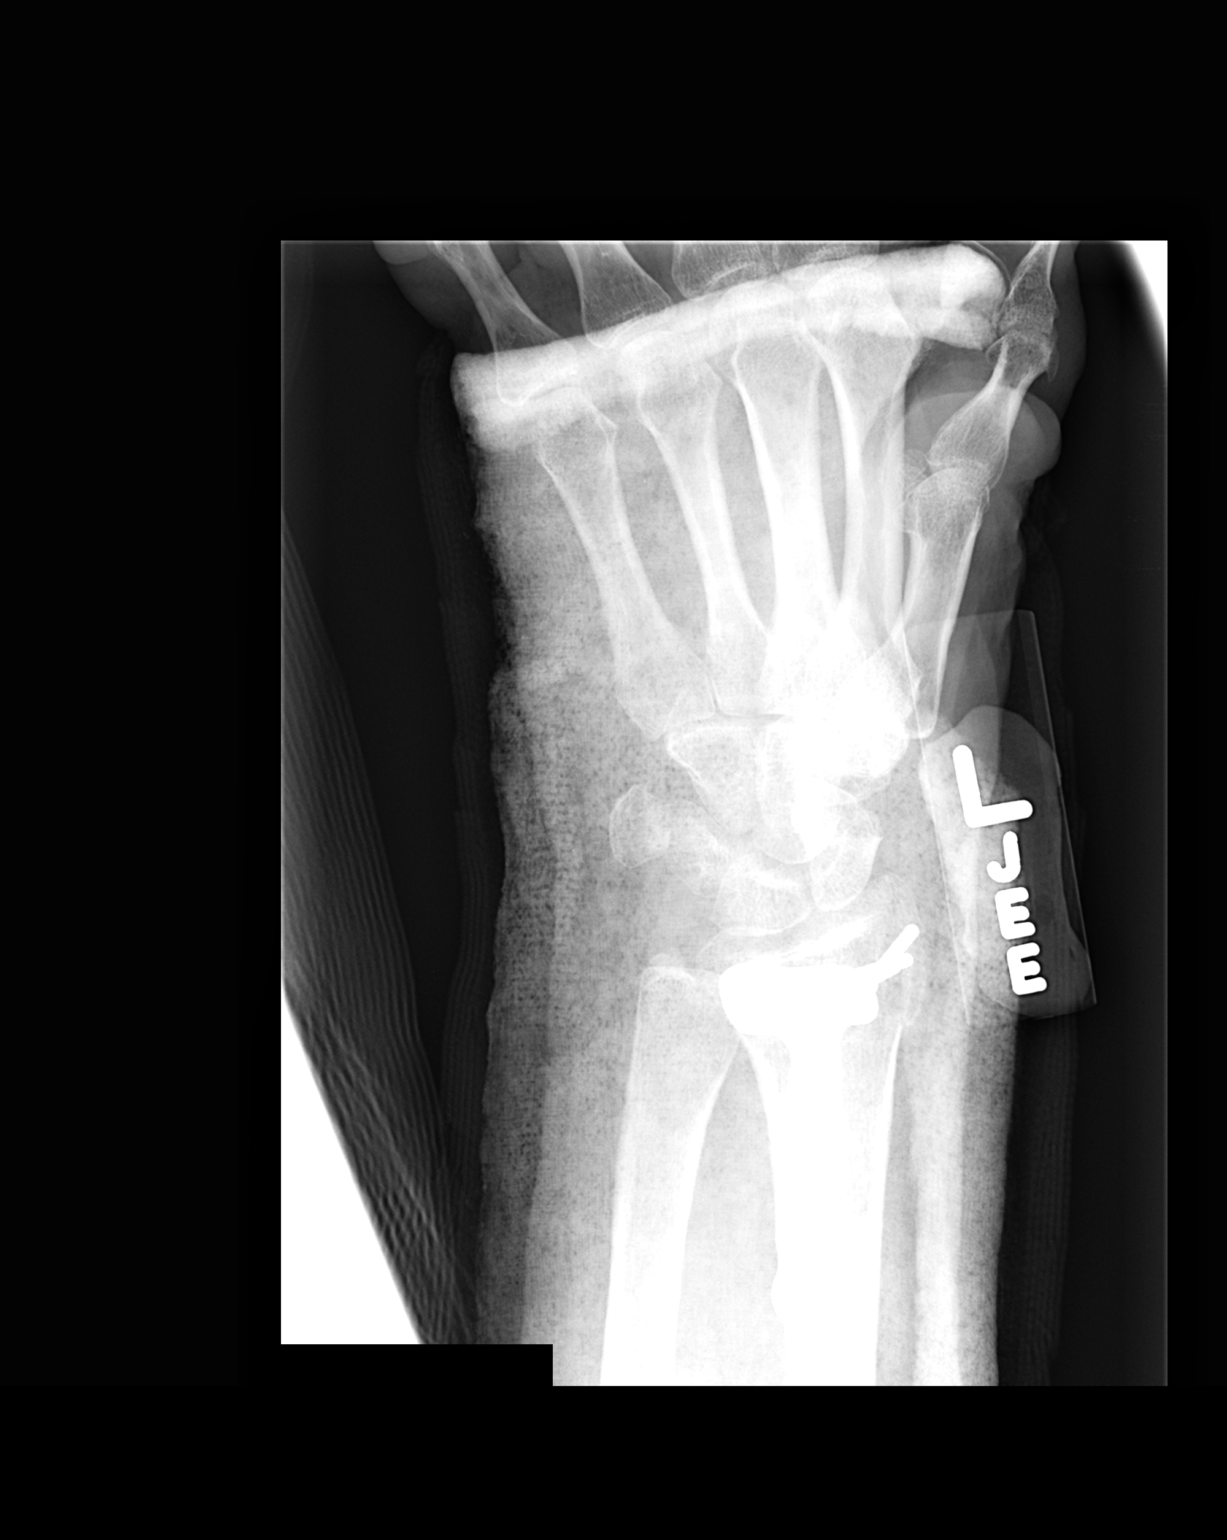

[view not recorded (2 of 2)]
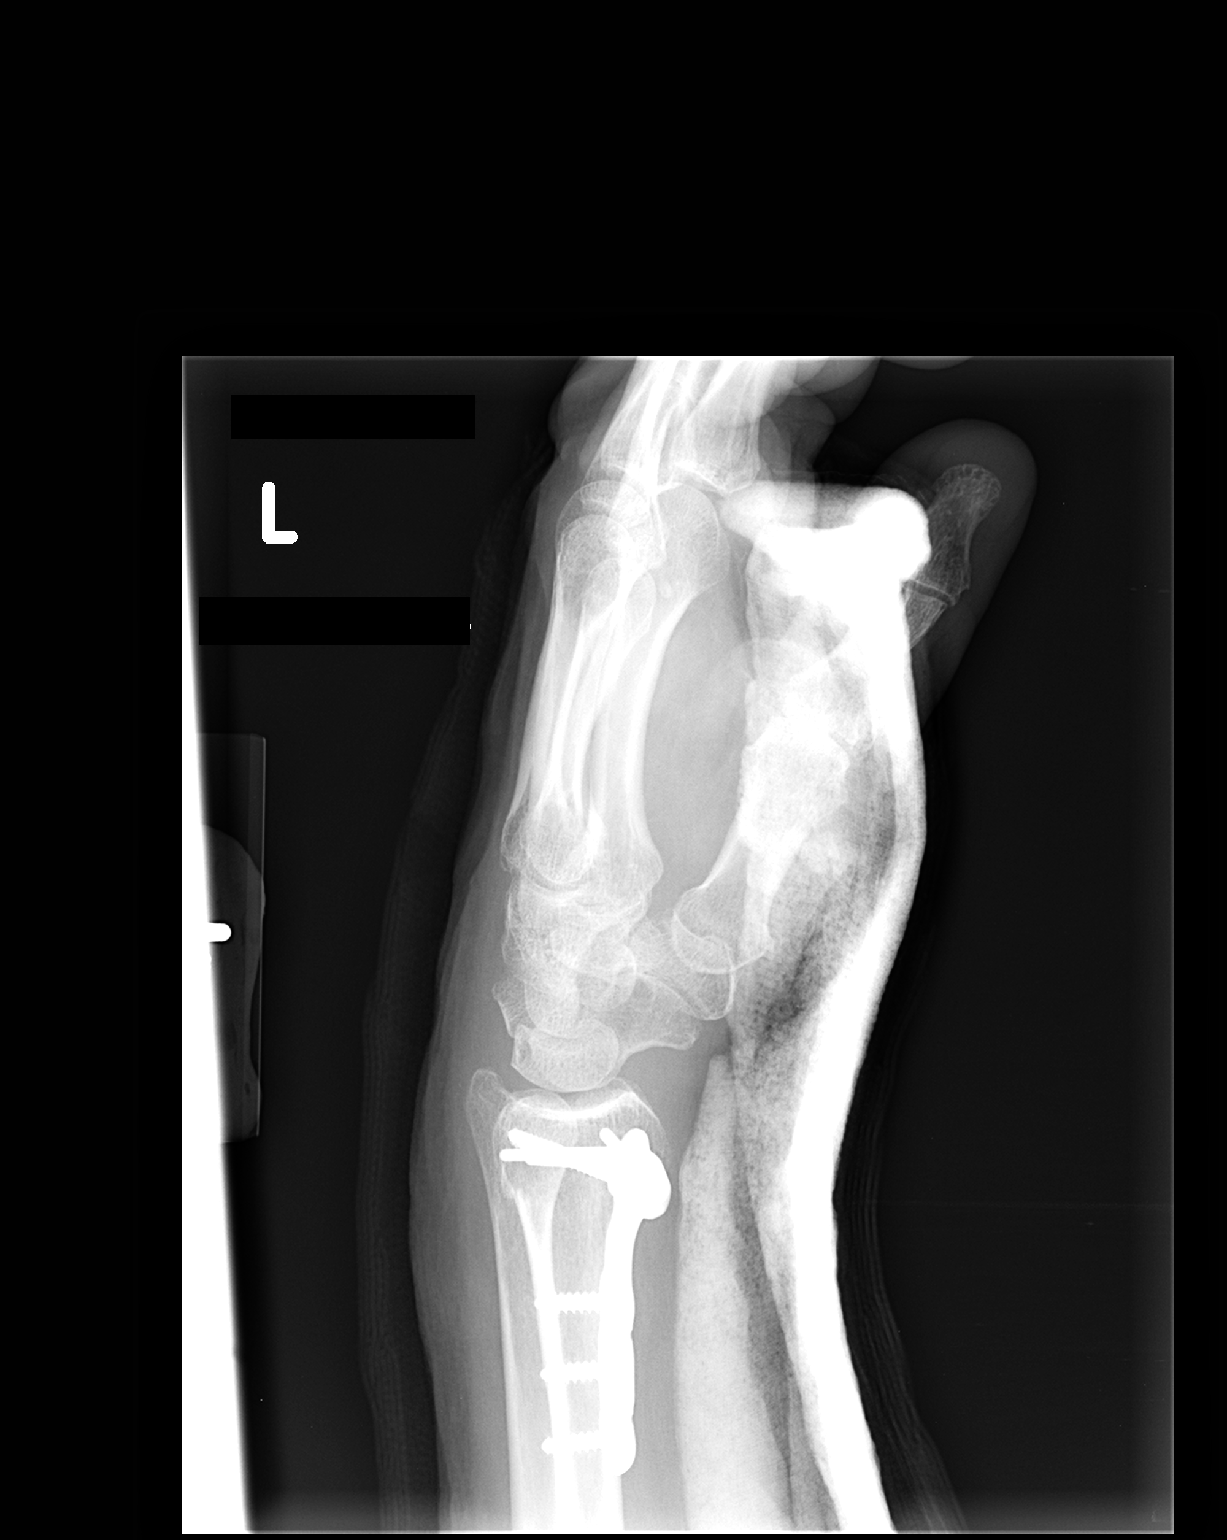

[2 of 2 positions shown; findings below may reference images not displayed]

FINDINGS: Portable AP and lateral views of the wrist demonstrate
open reduction internal fixation of distal radius fracture.
Improved position and alignment.
IMPRESSION: As above.

## 2013-05-26 ENCOUNTER — Other Ambulatory Visit: Payer: Self-pay | Admitting: Family Medicine

## 2013-05-26 DIAGNOSIS — E119 Type 2 diabetes mellitus without complications: Secondary | ICD-10-CM

## 2013-05-26 MED ORDER — PIOGLITAZONE HCL 30 MG PO TABS: 30.0000 mg | ORAL_TABLET | Freq: Every day | ORAL | Status: DC

## 2013-05-26 NOTE — Telephone Encounter (Signed)
Needs Refill on Actos  Sent to [facility]

## 2013-06-01 ENCOUNTER — Ambulatory Visit (INDEPENDENT_AMBULATORY_CARE_PROVIDER_SITE_OTHER): Payer: BC Managed Care – PPO | Admitting: Medical

## 2013-06-01 ENCOUNTER — Encounter: Payer: Self-pay | Admitting: Medical

## 2013-06-01 VITALS — Temp 98.1°F

## 2013-06-01 DIAGNOSIS — IMO0002 Reserved for concepts with insufficient information to code with codable children: Secondary | ICD-10-CM

## 2013-06-01 MED ORDER — SILVER SULFADIAZINE 1 % EX CREA: TOPICAL_CREAM | Freq: Every day | CUTANEOUS | Status: DC
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
## 2013-06-01 NOTE — Patient Instructions (Signed)

## 2013-08-20 ENCOUNTER — Telehealth: Payer: Self-pay | Admitting: Family Medicine

## 2013-08-20 ENCOUNTER — Other Ambulatory Visit: Payer: Self-pay

## 2013-08-20 DIAGNOSIS — E119 Type 2 diabetes mellitus without complications: Secondary | ICD-10-CM

## 2013-08-20 MED ORDER — PIOGLITAZONE HCL 30 MG PO TABS: 30.0000 mg | ORAL_TABLET | Freq: Every day | ORAL | Status: DC

## 2013-08-20 NOTE — Telephone Encounter (Signed)
Pt called and needs refill on actos 30 mg [facility]

## 2013-08-20 NOTE — Telephone Encounter (Signed)
SENT IN ACTOS  

## 2013-09-10 ENCOUNTER — Ambulatory Visit (INDEPENDENT_AMBULATORY_CARE_PROVIDER_SITE_OTHER): Payer: BC Managed Care – PPO | Admitting: Family Medicine

## 2013-09-10 ENCOUNTER — Encounter: Payer: Self-pay | Admitting: Family Medicine

## 2013-09-10 VITALS — BP 120/78 | HR 66 | Wt 204.0 lb

## 2013-09-10 DIAGNOSIS — E119 Type 2 diabetes mellitus without complications: Secondary | ICD-10-CM

## 2013-09-10 DIAGNOSIS — J3489 Other specified disorders of nose and nasal sinuses: Secondary | ICD-10-CM

## 2013-09-10 NOTE — Progress Notes (Signed)
Subjective:    [name] is a 62 y.o. female who presents for follow-up of Type 2 diabetes mellitus.  She would also like to address some scaly patches on her scalp that she has noticed over the last 3-4 weeks. In April of this year, she saw Dr. Lynelle Doctor for similar scaly, occasionally pruritic patches and Dr. Delford Field impression was that it likely represented seborrheic dermatitis. She was encouraged to cortisone cream and to use Zinc-containing shampoo and she thought that it had probably resolved by some time over the summer. 3-4 weeks ago she began to notice it again though it is a little more diffuse this time. It is sometimes itchy, sometimes not. She is currently using a tar-containing shampoo twice weekly with minimal success in mitigating these scaly patches.   She also has some red and irritated-appearing areas under her breasts and below her pannus that she has noticed off and on over the past year. It is more noticeable on days when she is sweaty at work. These areas are occasionally itchy. She is not aware of having had any fever.  [name] is also concerned about a 3-4 mm bump on her nose that has manifested off and on over the last year. When she is aware of it she picks at it, leaving a small scab.   Home blood sugar records: fasting range: High 127, Low 118  Current symptoms/problems include none and havebeen stable. Daily foot checks: Performing weekly   Any foot concerns: None at present, notes that soaks in warm water occasionally necessary to clip toenails. Last eye exam:  October 2013   Medication compliance: Good adherence  Current diet: Attempting to cut down on portion size at meals. Current exercise: Active at work, trying to walk more frequently since recovering from Right leg fracture last year. Known diabetic complications: none Cardiovascular risk factors: diabetes mellitus, dyslipidemia, hypertension and obesity (BMI >= 30 kg/m2)   The following portions of  the patient's history were reviewed and updated as appropriate: allergies, current medications, past family history, past medical history, past social history, past surgical history and problem list.    ROS as in subjective above    Objective:    BP 120/78  Pulse 66  Wt 204 lb (92.534 kg)  SpO2 98%  Filed Vitals:   09/10/13 0817  BP: 120/78  Pulse: 66    General appearence: alert, no distress, WD/WN. Scalp does show scaling however at no lesions were seen at the hairline or behind her ears. Neck: supple, no lymphadenopathy, no thyromegaly, no masses Heart: RRR, normal S1, S2, no murmurs Lungs: CTA bilaterally, no wheezes, rhonchi, or rales Abdomen: +bs, soft, non tender, non distended, no masses, no hepatomegaly, no splenomegaly. Pulses: 2+ symmetric, upper and lower extremities, normal cap refill Skin/Ext: There is a 3-4 mm cratered papule with central eschar on the right lateral aspect of the apex of the nose. no edema, underneath throat breasts did show slight erythema and induration and it was also none noted under the pannus on the abdomen. Foot exam: No chronic wounds or ulcerations. Neuro: foot monofilament exam normal   Lab Review Lab Results  Component Value Date   HGBA1C 6.1 09/10/2013   Lab Results  Component Value Date   CHOL 157 06/27/2011   HDL 56 06/27/2011   LDLCALC 82 06/27/2011   TRIG 96 06/27/2011   CHOLHDL 2.8 06/27/2011   No results found for this basename: MICROALBUR, [id number]     Chemistry  Component Value Date/Time   NA 138 11/03/2012 0912   K 4.4 11/03/2012 0912   CL 98 11/03/2012 0912   CO2 30 11/03/2012 0912   BUN 21 11/03/2012 0912   CREATININE 0.56 11/03/2012 0912   CREATININE 0.61 06/27/2011 0944      Component Value Date/Time   CALCIUM 10.1 11/03/2012 0912   CALCIUM 8.9 10/13/2012 0500   ALKPHOS 92 11/03/2012 0912   AST 20 11/03/2012 0912   ALT 19 11/03/2012 0912   BILITOT 0.2* 11/03/2012 0912        Chemistry      Component Value  Date/Time   NA 138 11/03/2012 0912   K 4.4 11/03/2012 0912   CL 98 11/03/2012 0912   CO2 30 11/03/2012 0912   BUN 21 11/03/2012 0912   CREATININE 0.56 11/03/2012 0912   CREATININE 0.61 06/27/2011 0944      Component Value Date/Time   CALCIUM 10.1 11/03/2012 0912   CALCIUM 8.9 10/13/2012 0500   ALKPHOS 92 11/03/2012 0912   AST 20 11/03/2012 0912   ALT 19 11/03/2012 0912   BILITOT 0.2* 11/03/2012 0912       Last optometry/ophthalmology exam reviewed from: 07/2012    Assessment:   1. Diabetes Mellitus 1.  Rx changes: none 2.  Education: Reviewed 'ABCs' of diabetes management (respective goals in parentheses):  A1C (<7), blood pressure (<130/80), and cholesterol (LDL <100). 3.  Compliance at present is estimated to be good. Efforts to improve compliance (if necessary) will be directed at increased exercise.4. Follow up: 4 months   2. Nasal Lesion: Concerning for dermatologic malignancy. Ambulatory referral to Dermatology.  3. Seborrheic dermatitis (scalp): Continue to apply Tar- or Zinc-containing shampoos twice weekly to keep from drying scalp excessively.  4. Intertrigo: Apply Lotrimin AF to affected areas at night, Neosporin to the affected areas in the morning.

## 2013-09-11 ENCOUNTER — Other Ambulatory Visit: Payer: Self-pay | Admitting: Dermatology

## 2013-09-16 NOTE — Telephone Encounter (Signed)
09/16/2013 °

## 2013-11-23 ENCOUNTER — Telehealth: Payer: Self-pay | Admitting: Internal Medicine

## 2013-11-23 MED ORDER — LISINOPRIL-HYDROCHLOROTHIAZIDE 20-12.5 MG PO TABS: 1.0000 | ORAL_TABLET | Freq: Every day | ORAL | Status: DC

## 2013-11-23 MED ORDER — ATORVASTATIN CALCIUM 10 MG PO TABS: 10.0000 mg | ORAL_TABLET | Freq: Every day | ORAL | Status: DC

## 2013-11-23 NOTE — Telephone Encounter (Signed)
Pt needs a refill for lipitor 10mg , and lisinopril-hctz [facility]

## 2013-11-30 DIAGNOSIS — C44311 Basal cell carcinoma of skin of nose: Secondary | ICD-10-CM

## 2013-11-30 HISTORY — DX: Basal cell carcinoma of skin of nose: C44.311

## 2013-12-10 ENCOUNTER — Telehealth: Payer: Self-pay | Admitting: Family Medicine

## 2013-12-10 DIAGNOSIS — E119 Type 2 diabetes mellitus without complications: Secondary | ICD-10-CM

## 2013-12-10 MED ORDER — PIOGLITAZONE HCL 30 MG PO TABS: 30.0000 mg | ORAL_TABLET | Freq: Every day | ORAL | Status: DC

## 2013-12-10 NOTE — Telephone Encounter (Signed)
Sent a 14 day supply to a local pharmacy

## 2013-12-25 ENCOUNTER — Encounter: Payer: Self-pay | Admitting: Internal Medicine

## 2013-12-30 ENCOUNTER — Encounter: Payer: Self-pay | Admitting: Family Medicine

## 2014-01-04 LAB — HM COLONOSCOPY

## 2014-01-06 ENCOUNTER — Ambulatory Visit (INDEPENDENT_AMBULATORY_CARE_PROVIDER_SITE_OTHER): Payer: BC Managed Care – PPO | Admitting: Family Medicine

## 2014-01-06 ENCOUNTER — Encounter: Payer: Self-pay | Admitting: Family Medicine

## 2014-01-06 VITALS — BP 120/74 | HR 72 | Wt 202.0 lb

## 2014-01-06 DIAGNOSIS — E785 Hyperlipidemia, unspecified: Secondary | ICD-10-CM

## 2014-01-06 DIAGNOSIS — E119 Type 2 diabetes mellitus without complications: Secondary | ICD-10-CM

## 2014-01-06 DIAGNOSIS — E1169 Type 2 diabetes mellitus with other specified complication: Secondary | ICD-10-CM

## 2014-01-06 DIAGNOSIS — E669 Obesity, unspecified: Secondary | ICD-10-CM

## 2014-01-06 DIAGNOSIS — I10 Essential (primary) hypertension: Secondary | ICD-10-CM

## 2014-01-06 DIAGNOSIS — E1159 Type 2 diabetes mellitus with other circulatory complications: Secondary | ICD-10-CM

## 2014-01-06 LAB — COMPREHENSIVE METABOLIC PANEL
ALK PHOS: 68 U/L (ref 39–117)
ALT: 20 U/L (ref 0–35)
AST: 21 U/L (ref 0–37)
Albumin: 3.9 g/dL (ref 3.5–5.2)
BILIRUBIN TOTAL: 0.4 mg/dL (ref 0.2–1.2)
BUN: 17 mg/dL (ref 6–23)
CO2: 33 mEq/L — ABNORMAL HIGH (ref 19–32)
Calcium: 9.2 mg/dL (ref 8.4–10.5)
Chloride: 102 mEq/L (ref 96–112)
Creat: 0.65 mg/dL (ref 0.50–1.10)
GLUCOSE: 120 mg/dL — AB (ref 70–99)
Potassium: 3.8 mEq/L (ref 3.5–5.3)
SODIUM: 142 meq/L (ref 135–145)
TOTAL PROTEIN: 6.1 g/dL (ref 6.0–8.3)

## 2014-01-06 LAB — CBC WITH DIFFERENTIAL/PLATELET
Basophils Absolute: 0 10*3/uL (ref 0.0–0.1)
Basophils Relative: 0 % (ref 0–1)
EOS ABS: 0.1 10*3/uL (ref 0.0–0.7)
EOS PCT: 2 % (ref 0–5)
HCT: 32.5 % — ABNORMAL LOW (ref 36.0–46.0)
HEMOGLOBIN: 10.9 g/dL — AB (ref 12.0–15.0)
LYMPHS ABS: 1.2 10*3/uL (ref 0.7–4.0)
Lymphocytes Relative: 26 % (ref 12–46)
MCH: 30 pg (ref 26.0–34.0)
MCHC: 33.5 g/dL (ref 30.0–36.0)
MCV: 89.5 fL (ref 78.0–100.0)
MONOS PCT: 10 % (ref 3–12)
Monocytes Absolute: 0.5 10*3/uL (ref 0.1–1.0)
Neutro Abs: 2.9 10*3/uL (ref 1.7–7.7)
Neutrophils Relative %: 62 % (ref 43–77)
PLATELETS: 250 10*3/uL (ref 150–400)
RBC: 3.63 MIL/uL — AB (ref 3.87–5.11)
RDW: 14.5 % (ref 11.5–15.5)
WBC: 4.6 10*3/uL (ref 4.0–10.5)

## 2014-01-06 LAB — POCT UA - MICROALBUMIN
Albumin/Creatinine Ratio, Urine, POC: 5
CREATININE, POC: 132.8 mg/dL
MICROALBUMIN (UR) POC: 6.6 mg/L

## 2014-01-06 LAB — LIPID PANEL
CHOL/HDL RATIO: 2.4 ratio
Cholesterol: 122 mg/dL (ref 0–200)
HDL: 50 mg/dL (ref >39)
LDL Cholesterol: 56 mg/dL (ref 0–99)
Triglycerides: 78 mg/dL (ref <150)
VLDL: 16 mg/dL (ref 0–40)

## 2014-01-06 LAB — POCT GLYCOSYLATED HEMOGLOBIN (HGB A1C): HEMOGLOBIN A1C: 6.4

## 2014-01-06 MED ORDER — GLUCOSE BLOOD VI STRP: ORAL_STRIP | Status: DC

## 2014-01-06 MED ORDER — PIOGLITAZONE HCL 30 MG PO TABS: 30.0000 mg | ORAL_TABLET | Freq: Every day | ORAL | Status: DC

## 2014-01-06 NOTE — Progress Notes (Signed)
   Subjective:    Patient ID: [name], female    DOB: 10-27-1950, 63 y.o.   MRN: [id number]  HPI She is here for a recheck on her diabetes. She checks her blood sugars regularly and initially air in the low 100 range. She had an eye exam several months ago. She checks her feet regularly. She has lost several pounds since her last visit. Exercise is usually mainly work related. Smoking and drinking were reviewed. Her work and home life are going well. She has no particular concerns or complaints other than difficulty with her scalp   Review of Systems     Objective:   Physical Exam Alert and in no distress. Hemoglobin A1c 6.4.      Assessment & Plan:  Diabetes mellitus - Plan: pioglitazone (ACTOS) 30 MG tablet, POCT UA - Microalbumin, HM DIABETES FOOT EXAM, glucose blood test strip, HM DIABETES FOOT EXAM, HgB A1c  Obesity (BMI 30-39.9)  Hypertension associated with diabetes - Plan: Comprehensive metabolic panel, CBC with Differential  Hyperlipidemia LDL goal <70 - Plan: Lipid panel  she will followup with her dermatologist. Encouraged her to continue to take good care of herself.

## 2014-01-08 ENCOUNTER — Ambulatory Visit: Payer: BC Managed Care – PPO | Admitting: Family Medicine

## 2014-01-08 ENCOUNTER — Telehealth: Payer: Self-pay | Admitting: Family Medicine

## 2014-01-08 NOTE — Telephone Encounter (Signed)
lm

## 2014-01-12 ENCOUNTER — Encounter: Payer: Self-pay | Admitting: Internal Medicine

## 2014-01-13 ENCOUNTER — Other Ambulatory Visit: Payer: Self-pay | Admitting: *Deleted

## 2014-01-13 ENCOUNTER — Telehealth: Payer: Self-pay | Admitting: Family Medicine

## 2014-01-13 DIAGNOSIS — E119 Type 2 diabetes mellitus without complications: Secondary | ICD-10-CM

## 2014-01-13 MED ORDER — GLUCOSE BLOOD VI STRP: ORAL_STRIP | Status: DC

## 2014-01-13 NOTE — Telephone Encounter (Signed)
Resent to [facility] as requested.

## 2014-01-20 NOTE — Telephone Encounter (Signed)
Done

## 2014-01-26 ENCOUNTER — Encounter: Payer: Self-pay | Admitting: Internal Medicine

## 2014-01-27 ENCOUNTER — Encounter: Payer: Self-pay | Admitting: Family Medicine

## 2014-03-06 ENCOUNTER — Other Ambulatory Visit: Payer: Self-pay | Admitting: Family Medicine

## 2014-05-04 ENCOUNTER — Ambulatory Visit (INDEPENDENT_AMBULATORY_CARE_PROVIDER_SITE_OTHER): Payer: BC Managed Care – PPO | Admitting: Family Medicine

## 2014-05-04 ENCOUNTER — Encounter: Payer: Self-pay | Admitting: Family Medicine

## 2014-05-04 VITALS — BP 112/70 | HR 61 | Wt 208.0 lb

## 2014-05-04 DIAGNOSIS — E785 Hyperlipidemia, unspecified: Secondary | ICD-10-CM

## 2014-05-04 DIAGNOSIS — E1159 Type 2 diabetes mellitus with other circulatory complications: Secondary | ICD-10-CM

## 2014-05-04 DIAGNOSIS — I10 Essential (primary) hypertension: Secondary | ICD-10-CM

## 2014-05-04 DIAGNOSIS — E1169 Type 2 diabetes mellitus with other specified complication: Secondary | ICD-10-CM

## 2014-05-04 DIAGNOSIS — E663 Overweight: Secondary | ICD-10-CM

## 2014-05-04 DIAGNOSIS — E119 Type 2 diabetes mellitus without complications: Secondary | ICD-10-CM

## 2014-05-04 DIAGNOSIS — E669 Obesity, unspecified: Secondary | ICD-10-CM

## 2014-05-04 DIAGNOSIS — I152 Hypertension secondary to endocrine disorders: Secondary | ICD-10-CM

## 2014-05-04 LAB — POCT GLYCOSYLATED HEMOGLOBIN (HGB A1C): HEMOGLOBIN A1C: 6.2

## 2014-05-04 NOTE — Patient Instructions (Signed)
15-20 minutes everyday is something physical. We'll send you to the nutritionist for some fine-tuning

## 2014-05-04 NOTE — Progress Notes (Signed)
  Subjective:    [name] is a 63 y.o. female who presents for follow-up of Type 2 diabetes mellitus.    Home blood sugar records: AVG.110  Current symptoms/problem NONE Daily foot checks:   Any foot concerns: YES/ PLACE ON RIGHT FOOT WITH PAIN. She does have intermittent right foot pain. Last eye exam:  12/14 DR.DOLAN   Medication compliance: Good Current diet: USING WRAPS NOW FOR SANDWITCHES Current exercise: WORKING STILL HAS ISSUE WITH HER FEET Known diabetic complications: none Cardiovascular risk factors: advanced age (older than 31 for men, 2 for women), diabetes mellitus, dyslipidemia, hypertension, obesity (BMI >= 30 kg/m2) and sedentary lifestyle   The following portions of the patient's history were reviewed and updated as appropriate: allergies, current medications, past medical history, past social history and problem list.  ROS as in subjective above    Objective:   General appearence: alert, no distress, WD/WN  Lab Review Lab Results  Component Value Date   HGBA1C 6.4 01/06/2014   Lab Results  Component Value Date   CHOL 122 01/06/2014   HDL 50 01/06/2014   LDLCALC 56 01/06/2014   TRIG 78 01/06/2014   CHOLHDL 2.4 01/06/2014   No results found for this basenameDerl Barrow     Chemistry      Component Value Date/Time   NA 142 01/06/2014 0836   K 3.8 01/06/2014 0836   CL 102 01/06/2014 0836   CO2 33* 01/06/2014 0836   BUN 17 01/06/2014 0836   CREATININE 0.65 01/06/2014 0836   CREATININE 0.56 11/03/2012 0912      Component Value Date/Time   CALCIUM 9.2 01/06/2014 0836   CALCIUM 8.9 10/13/2012 0500   ALKPHOS 68 01/06/2014 0836   AST 21 01/06/2014 0836   ALT 20 01/06/2014 0836   BILITOT 0.4 01/06/2014 0836        Chemistry      Component Value Date/Time   NA 142 01/06/2014 0836   K 3.8 01/06/2014 0836   CL 102 01/06/2014 0836   CO2 33* 01/06/2014 0836   BUN 17 01/06/2014 0836   CREATININE 0.65 01/06/2014 0836   CREATININE 0.56 11/03/2012 0912       Component Value Date/Time   CALCIUM 9.2 01/06/2014 0836   CALCIUM 8.9 10/13/2012 0500   ALKPHOS 68 01/06/2014 0836   AST 21 01/06/2014 0836   ALT 20 01/06/2014 0836   BILITOT 0.4 01/06/2014 0836         Assessment:   Encounter Diagnoses  Name Primary?  . Obesity (BMI 30-39.9) Yes  . Hypertension associated with diabetes   . Hyperlipidemia LDL goal <70   . Type 2 diabetes mellitus without complication          Plan:    1.  Rx changes: none 2.  Education: Reviewed 'ABCs' of diabetes management (respective goals in parentheses):  A1C (<7), blood pressure (<130/80), and cholesterol (LDL <100). 3.  Compliance at present is estimated to be good. Efforts to improve compliance (if necessary) will be directed at dietary modifications: I will send back to nutrition. Also recommend daily physical activity at least 20 minutes. 4. Follow up: 4 months

## 2014-05-11 ENCOUNTER — Ambulatory Visit: Payer: BC Managed Care – PPO | Admitting: Family Medicine

## 2014-05-19 ENCOUNTER — Encounter: Payer: Self-pay | Admitting: Family Medicine

## 2014-05-19 ENCOUNTER — Ambulatory Visit (INDEPENDENT_AMBULATORY_CARE_PROVIDER_SITE_OTHER): Payer: BC Managed Care – PPO | Admitting: Family Medicine

## 2014-05-19 VITALS — BP 110/60 | HR 62 | Wt 206.0 lb

## 2014-05-19 DIAGNOSIS — R42 Dizziness and giddiness: Secondary | ICD-10-CM

## 2014-05-19 MED ORDER — MECLIZINE HCL 12.5 MG PO TABS: 12.5000 mg | ORAL_TABLET | Freq: Three times a day (TID) | ORAL | Status: DC | PRN

## 2014-05-19 NOTE — Progress Notes (Signed)
   Subjective:    Patient ID: [name], female    DOB: 03-Oct-1951, 63 y.o.   MRN: [id number]  HPI She is here for evaluation of dizziness this started this morning. She states she was up walking around and noted dizziness. The dizziness to get better when she sat down and worsened again staining. Food had no effect. She did have some slight blurred vision with this but no double vision nausea, vomiting, arm weakness. No chest pain or heart rate changes. No change in her medications. No change in dizziness based on head position.   Review of Systems     Objective:   Physical Exam alert and in no distress. Tympanic membranes and canals are normal. Throat is clear. Tonsils are normal. Neck is supple without adenopathy or thyromegaly. Cardiac exam shows a regular sinus rhythm without murmurs or gallops. Lungs are clear to auscultation. EOMI. Other cranial nerves intact. Cerebellar testing normal. Normal  finger to nose.        Assessment & Plan:  Dizziness, nonspecific - Plan: meclizine (ANTIVERT) 12.5 MG tablet  Pay attention to any changes in her present symptom complex and if anything worsens, she is to call me

## 2014-06-10 ENCOUNTER — Encounter: Payer: Self-pay | Admitting: Family Medicine

## 2014-06-10 ENCOUNTER — Ambulatory Visit (INDEPENDENT_AMBULATORY_CARE_PROVIDER_SITE_OTHER): Payer: BC Managed Care – PPO | Admitting: Family Medicine

## 2014-06-10 VITALS — BP 116/70 | HR 66 | Ht 63.5 in | Wt 203.0 lb

## 2014-06-10 DIAGNOSIS — Z0000 Encounter for general adult medical examination without abnormal findings: Secondary | ICD-10-CM

## 2014-06-10 DIAGNOSIS — Z23 Encounter for immunization: Secondary | ICD-10-CM

## 2014-06-10 DIAGNOSIS — E1169 Type 2 diabetes mellitus with other specified complication: Secondary | ICD-10-CM

## 2014-06-10 DIAGNOSIS — E785 Hyperlipidemia, unspecified: Secondary | ICD-10-CM

## 2014-06-10 DIAGNOSIS — E119 Type 2 diabetes mellitus without complications: Secondary | ICD-10-CM

## 2014-06-10 DIAGNOSIS — J309 Allergic rhinitis, unspecified: Secondary | ICD-10-CM

## 2014-06-10 DIAGNOSIS — E1159 Type 2 diabetes mellitus with other circulatory complications: Secondary | ICD-10-CM

## 2014-06-10 DIAGNOSIS — I10 Essential (primary) hypertension: Secondary | ICD-10-CM

## 2014-06-10 DIAGNOSIS — I152 Hypertension secondary to endocrine disorders: Secondary | ICD-10-CM

## 2014-06-10 DIAGNOSIS — J302 Other seasonal allergic rhinitis: Secondary | ICD-10-CM

## 2014-06-10 DIAGNOSIS — E669 Obesity, unspecified: Secondary | ICD-10-CM

## 2014-06-10 NOTE — Progress Notes (Signed)
   Subjective:    Patient ID: [name], female    DOB: 10-22-1951, 63 y.o.   MRN: [id number]  HPI She is here for complete examination. She does have underlying diabetes and does come in regularly for checkups. She is doing quite well with that. She also has a history of basal cell CA of the nose and does get regular followup on that. She uses Robaxin for occasional upper back spasms. Her allergies are under good control. She has a previous history of leg fracture but at this point is having no difficulty with that. Family and social history were reviewed. Her immunizations were also reviewed. She is up-to-date on all of her health maintenance issues. She has had a hysterectomy and therefore does not need a Pap. Her last eye exam was December of last year. Review of Systems  All other systems reviewed and are negative.      Objective:   Physical Exam BP 116/70  Pulse 66  Ht 5' 3.5" (1.613 m)  Wt 203 lb (92.08 kg)  BMI 35.39 kg/m2  General Appearance:    Alert, cooperative, no distress, appears stated age  Head:    Normocephalic, without obvious abnormality, atraumatic  Eyes:    PERRL, conjunctiva/corneas clear, EOM's intact, fundi    benign  Ears:    Normal TM's and external ear canals  Nose:   Nares normal, mucosa normal, no drainage or sinus   tenderness  Throat:   Lips, mucosa, and tongue normal; teeth and gums normal  Neck:   Supple, no lymphadenopathy;  thyroid:  no   enlargement/tenderness/nodules; no carotid   bruit or JVD  Back:    Spine nontender, no curvature, ROM normal, no CVA     tenderness  Lungs:     Clear to auscultation bilaterally without wheezes, rales or     ronchi; respirations unlabored  Chest Wall:    No tenderness or deformity   Heart:    Regular rate and rhythm, S1 and S2 normal, no murmur, rub   or gallop  Breast Exam:    Deferred to GYN  Abdomen:     Soft, non-tender, nondistended, normoactive bowel sounds,    no masses, no hepatosplenomegaly    Genitalia:    Deferred to GYN     Extremities:   No clubbing, cyanosis or edema  Pulses:   2+ and symmetric all extremities  Skin:   Skin color, texture, turgor normal, no rashes or lesions  Lymph nodes:   Cervical, supraclavicular, and axillary nodes normal  Neurologic:   CNII-XII intact, normal strength, sensation and gait; reflexes 2+ and symmetric throughout          Psych:   Normal mood, affect, hygiene and grooming.          Assessment & Plan:  Need for prophylactic vaccination and inoculation against influenza - Plan: Flu Vaccine QUAD 36+ mos IM  Obesity (BMI 30-39.9)  Hypertension associated with diabetes  Hyperlipidemia LDL goal <70  Type 2 diabetes mellitus without complication  Routine general medical examination at a health care facility  Allergic rhinitis, seasonal  continue present medication regimen.

## 2014-06-30 ENCOUNTER — Encounter: Payer: Self-pay | Admitting: *Deleted

## 2014-06-30 ENCOUNTER — Encounter: Payer: BC Managed Care – PPO | Attending: Family Medicine | Admitting: *Deleted

## 2014-06-30 VITALS — Ht 64.0 in | Wt 206.9 lb

## 2014-06-30 DIAGNOSIS — E119 Type 2 diabetes mellitus without complications: Secondary | ICD-10-CM | POA: Insufficient documentation

## 2014-06-30 DIAGNOSIS — Z713 Dietary counseling and surveillance: Secondary | ICD-10-CM | POA: Diagnosis not present

## 2014-06-30 NOTE — Patient Instructions (Signed)
Plan:  Aim for 2-3 Carb Choices per meal (30-45 grams) +/- 1 either way  Aim for 0-1 Carbs per snack if hungry  Include protein in moderation with your meals and snacks Consider reading food labels for Total Carbohydrate and Fat Grams of foods Consider  increasing your activity level by 15-30 minutes most days a week as tolerated Consider checking BG at alternate times per day as directed by MD  Continue  taking medication as directed by MD

## 2014-06-30 NOTE — Progress Notes (Signed)
  Medical Nutrition Therapy:  Appt start time: 0930 end time:  1100.   Assessment:  Primary concerns today: Weight  loss and nutrition information about diabetes. Works 12 hrs shift. Lives with her husband. Does shopping and cooking. Active on job but not as any planned physical activity. Has had diabetes 20 years ago. Currently testing blood sugars three or four times per week. FBS 98-115 mg/dl. Has forgotten her meds twice this week due to busy work schedule. Works from 3 to 11 pm. Works in Scientist, research (life sciences).  Preferred Learning Style:    No preference indicated   Learning Readiness: Ready   MEDICATIONS: See list below   DIETARY INTAKE:  24-hr recall:  B (  9AM): Sometimes skips depending on work schedule of 12 hrs. Cereal-Basic 4 or Chex or cereal bar Briviat  2% milk Snk ( AM): no L ( PM): Chicken thighs, gravy, rice and green peas,cranapple juice 8 oz.  Snk ( PM): Fruit and cheese stick sometimes D ( PM): Meat, vegetables or flat wraps with cheese, lunch meat, spinach or lettuce Water or diet soda Snk ( PM): Cereal bar sometimes Beverages: water, juices  Usual physical activity: Just busy at work 12 hrs.  Estimated energy needs: 1200calories 135 g carbohydrates 90 g protein 33 g fat  Progress Towards Goal(s):  In progress.   Nutritional Diagnosis:  NB-1.1 Food and nutrition-related knowledge deficit As related to diabetes.  As evidenced by A1C 6.2%.    Intervention:  Nutrition counseling and diabetes education initiated. Discussed Carb Counting as method of portion control, reading food labels, and benefits of increased activity.  Plan:  Aim for 2-3 Carb Choices per meal (30-45 grams) +/- 1 either way  Aim for 0-1 Carbs per snack if hungry  Include protein in moderation with your meals and snacks Consider reading food labels for Total Carbohydrate and Fat Grams of foods Consider  increasing your activity level by 15-30 minutes most days a week as tolerated Consider  checking BG at alternate times per day as directed by MD  Continue  taking medication as directed by MD  Teaching Method Utilized:   Visual Auditory Hands on  Handouts given during visit include: Living Well with Diabetes Carb Counting and Food Label handouts Meal Plan Card  Barriers to learning/adherence to lifestyle change: none  Demonstrated degree of understanding via:  Teach Back   Monitoring/Evaluation:  Dietary intake, exercise, carb counting, reading food labels, and body weight prn.

## 2014-09-07 ENCOUNTER — Ambulatory Visit (INDEPENDENT_AMBULATORY_CARE_PROVIDER_SITE_OTHER): Payer: BC Managed Care – PPO | Admitting: Family Medicine

## 2014-09-07 ENCOUNTER — Encounter: Payer: Self-pay | Admitting: Family Medicine

## 2014-09-07 VITALS — BP 116/80 | HR 60 | Wt 201.0 lb

## 2014-09-07 DIAGNOSIS — M79672 Pain in left foot: Secondary | ICD-10-CM

## 2014-09-07 DIAGNOSIS — I10 Essential (primary) hypertension: Secondary | ICD-10-CM

## 2014-09-07 DIAGNOSIS — E1159 Type 2 diabetes mellitus with other circulatory complications: Secondary | ICD-10-CM

## 2014-09-07 DIAGNOSIS — Z23 Encounter for immunization: Secondary | ICD-10-CM

## 2014-09-07 DIAGNOSIS — M79671 Pain in right foot: Secondary | ICD-10-CM

## 2014-09-07 DIAGNOSIS — E119 Type 2 diabetes mellitus without complications: Secondary | ICD-10-CM

## 2014-09-07 DIAGNOSIS — E785 Hyperlipidemia, unspecified: Secondary | ICD-10-CM

## 2014-09-07 DIAGNOSIS — E669 Obesity, unspecified: Secondary | ICD-10-CM

## 2014-09-07 DIAGNOSIS — E1169 Type 2 diabetes mellitus with other specified complication: Secondary | ICD-10-CM

## 2014-09-07 LAB — POCT GLYCOSYLATED HEMOGLOBIN (HGB A1C): HEMOGLOBIN A1C: 6.5

## 2014-09-07 NOTE — Progress Notes (Signed)
Subjective:    [name] is a 63 y.o. female who presents for follow-up of Type 2 diabetes mellitus.  Over the past four months she has developed foot pain bilaterally.  It predominantly occurs when she is on her feet and is relieved by sitting down, although she sometimes notices is at rest.  The "stabbing" pain occurs approximately 1-2x/hr.  She has not noticed anything in particular that makes it worse or better, and occasional use of tylenol does not help very much.  She denies pain in her thighs or calves with prolonged activity.  She does not notice any electric feelings, numbness, burning pain.  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  She denies dizziness, syncope, dyspnea, or anginal symptoms.  Home blood sugar records: Patient test 2x weekly range 105 to 110  Current symptoms/problem pain in feet   Daily foot checks: Yes  Any foot concerns: Please see above. Last eye exam:  12/14 she has appointment in two weeks   Medication compliance:good Current diet: none Current exercise: work, once in a while she gets in a walk but she works 12hr days Known diabetic complications: none Cardiovascular risk factors: diabetes mellitus, dyslipidemia, hypertension and obesity (BMI >= 30 kg/m2)   The following portions of the patient's history were reviewed and updated as appropriate: current medications, past medical history, past social history and problem list.  ROS as in subjective above    Objective:    BP 116/80 mmHg  Pulse 60  Wt 201 lb (91.173 kg)  Filed Vitals:   09/07/14 0805  BP: 116/80  Pulse: 60    General appearence: alert, no distress, WD/WN  Ext: no edema Foot exam:   Foot monofilament exam normal.  Vibratory sensation intact at medial hallux.  No deformities, skin thickening, or pigmentation changes.  Lab Review Lab Results  Component Value Date   HGBA1C 6.2 05/04/2014   Lab Results  Component Value Date    CHOL 122 01/06/2014   HDL 50 01/06/2014   LDLCALC 56 01/06/2014   TRIG 78 01/06/2014   CHOLHDL 2.4 01/06/2014   No results found for: Derl Barrow   Chemistry      Component Value Date/Time   NA 142 01/06/2014 0836   K 3.8 01/06/2014 0836   CL 102 01/06/2014 0836   CO2 33* 01/06/2014 0836   BUN 17 01/06/2014 0836   CREATININE 0.65 01/06/2014 0836   CREATININE 0.56 11/03/2012 0912      Component Value Date/Time   CALCIUM 9.2 01/06/2014 0836   CALCIUM 8.9 10/13/2012 0500   ALKPHOS 68 01/06/2014 0836   AST 21 01/06/2014 0836   ALT 20 01/06/2014 0836   BILITOT 0.4 01/06/2014 0836        Chemistry      Component Value Date/Time   NA 142 01/06/2014 0836   K 3.8 01/06/2014 0836   CL 102 01/06/2014 0836   CO2 33* 01/06/2014 0836   BUN 17 01/06/2014 0836   CREATININE 0.65 01/06/2014 0836   CREATININE 0.56 11/03/2012 0912      Component Value Date/Time   CALCIUM 9.2 01/06/2014 0836   CALCIUM 8.9 10/13/2012 0500   ALKPHOS 68 01/06/2014 0836   AST 21 01/06/2014 0836   ALT 20 01/06/2014 0836   BILITOT 0.4 01/06/2014 0836     A1C today 6.5    Assessment:  Type 2 diabetes mellitus without complication - Plan: HM DIABETES FOOT EXAM,  HM DIABETES FOOT EXAM, POCT glycosylated hemoglobin (Hb A1C)  Need for prophylactic vaccination against Streptococcus pneumoniae (pneumococcus) - Plan: Pneumococcal conjugate vaccine 13-valent  Hypertension associated with diabetes  Hyperlipidemia associated with type 2 diabetes mellitus  Obesity (BMI 30-39.9)  Bilateral foot pain   Her foot pain is not classic for a diabetic neuropathy given it's focal nature and uncharacteristic description.  Diabetic neuropathies are classically worse distally with concurrent sensory decrements - she shows no evidence of this.  In light of her work situation this pain likely represents an overuse injury.  Her diabetes is well controlled with an A1C that has remained under the target of  6.5.  Her HTN is well controlled at today's visit.  She will not need lipid screening until April of next year as her last values were normal.  Her colonoscopy, eye exam, and mammography are up to date.  She will need her second pneumo 13 (2nd dose) today.  She has had her annual flu vaccination.    Plan:    1.  Rx changes: none 2.  Education: Reviewed 'ABCs' of diabetes management (respective goals in parentheses):  A1C (<7), blood pressure (<130/80), and cholesterol (LDL <100). 3.  Compliance at present is estimated to be good. Efforts to improve compliance (if necessary) will be directed at dietary modifications: Less carbohydrates. 4. Follow up: 4 months  I advised conservative care for her foot pain with arch supportive inserts at this time.

## 2014-09-07 NOTE — Progress Notes (Deleted)
  Subjective:    [name] is a 63 y.o. female who presents for follow-up of Type 2 diabetes mellitus.    Home blood sugar records: Patient test one time a day range 105 to 110  Current symptoms/problem pain in feet  Daily foot checks: ***   Any foot concerns: *** yes/pain in both feet now Last eye exam:  12/14 she has appointment in two weeks   Medication compliance: Current diet: none Current exercise: work, once in a while she gets in a walk but she works 12hr days Known diabetic complications: {diabetes complications:1215} Cardiovascular risk factors: {cv risk factors:510}   {Common ambulatory SmartLinks:19316}  ROS as in subjective above    Objective:    BP 116/80 mmHg  Pulse 60  Wt 201 lb (91.173 kg)  Filed Vitals:   09/07/14 0805  BP: 116/80  Pulse: 60    General appearence: alert, no distress, WD/WN Neck: supple, no lymphadenopathy, no thyromegaly, no masses Heart: RRR, normal S1, S2, no murmurs Lungs: CTA bilaterally, no wheezes, rhonchi, or rales Abdomen: +bs, soft, non tender, non distended, no masses, no hepatomegaly, no splenomegaly Pulses: 2+ symmetric, upper and lower extremities, normal cap refill Ext: no edema Foot exam:  Neuro: foot monofilament exam normal   Lab Review Lab Results  Component Value Date   HGBA1C 6.2 05/04/2014   Lab Results  Component Value Date   CHOL 122 01/06/2014   HDL 50 01/06/2014   LDLCALC 56 01/06/2014   TRIG 78 01/06/2014   CHOLHDL 2.4 01/06/2014   No results found for: Derl Barrow   Chemistry      Component Value Date/Time   NA 142 01/06/2014 0836   K 3.8 01/06/2014 0836   CL 102 01/06/2014 0836   CO2 33* 01/06/2014 0836   BUN 17 01/06/2014 0836   CREATININE 0.65 01/06/2014 0836   CREATININE 0.56 11/03/2012 0912      Component Value Date/Time   CALCIUM 9.2 01/06/2014 0836   CALCIUM 8.9 10/13/2012 0500   ALKPHOS 68 01/06/2014 0836   AST 21 01/06/2014 0836   ALT 20 01/06/2014 0836   BILITOT 0.4 01/06/2014 0836        Chemistry      Component Value Date/Time   NA 142 01/06/2014 0836   K 3.8 01/06/2014 0836   CL 102 01/06/2014 0836   CO2 33* 01/06/2014 0836   BUN 17 01/06/2014 0836   CREATININE 0.65 01/06/2014 0836   CREATININE 0.56 11/03/2012 0912      Component Value Date/Time   CALCIUM 9.2 01/06/2014 0836   CALCIUM 8.9 10/13/2012 0500   ALKPHOS 68 01/06/2014 0836   AST 21 01/06/2014 0836   ALT 20 01/06/2014 0836   BILITOT 0.4 01/06/2014 0836       Last optometry/ophthalmology exam reviewed from:    Assessment:        Plan:    1.  Rx changes: {none:33079} 2.  Education: Reviewed 'ABCs' of diabetes management (respective goals in parentheses):  A1C (<7), blood pressure (<130/80), and cholesterol (LDL <100). 3.  Compliance at present is estimated to be {good/fair/poor:33178}. Efforts to improve compliance (if necessary) will be directed at {compliance:16716}. 4. Follow up: {NUMBERS; 0-10:33138} {time:11}

## 2014-09-07 NOTE — Progress Notes (Deleted)
  Subjective:    [name] is a 63 y.o. female who presents for follow-up of Type 2 diabetes mellitus.    Home blood sugar records: Patient test one time a day range 105 to 110  Current symptoms/problem pain in feet  Daily foot checks: ***   Any foot concerns: *** yes/pain in both feet now Last eye exam:  12/14 she has appointment in two weeks   Medication compliance: Current diet: none Current exercise: work, once in a while she gets in a walk but she works 12hr days Known diabetic complications: {diabetes complications:1215} Cardiovascular risk factors: {cv risk factors:510}   {Common ambulatory SmartLinks:19316}  ROS as in subjective above    Objective:    There were no vitals taken for this visit.  There were no vitals filed for this visit.  General appearence: alert, no distress, WD/WN Neck: supple, no lymphadenopathy, no thyromegaly, no masses Heart: RRR, normal S1, S2, no murmurs Lungs: CTA bilaterally, no wheezes, rhonchi, or rales Abdomen: +bs, soft, non tender, non distended, no masses, no hepatomegaly, no splenomegaly Pulses: 2+ symmetric, upper and lower extremities, normal cap refill Ext: no edema Foot exam:  Neuro: foot monofilament exam normal   Lab Review Lab Results  Component Value Date   HGBA1C 6.2 05/04/2014   Lab Results  Component Value Date   CHOL 122 01/06/2014   HDL 50 01/06/2014   LDLCALC 56 01/06/2014   TRIG 78 01/06/2014   CHOLHDL 2.4 01/06/2014   No results found for: Derl Barrow   Chemistry      Component Value Date/Time   NA 142 01/06/2014 0836   K 3.8 01/06/2014 0836   CL 102 01/06/2014 0836   CO2 33* 01/06/2014 0836   BUN 17 01/06/2014 0836   CREATININE 0.65 01/06/2014 0836   CREATININE 0.56 11/03/2012 0912      Component Value Date/Time   CALCIUM 9.2 01/06/2014 0836   CALCIUM 8.9 10/13/2012 0500   ALKPHOS 68 01/06/2014 0836   AST 21 01/06/2014 0836   ALT 20 01/06/2014 0836   BILITOT 0.4 01/06/2014  0836        Chemistry      Component Value Date/Time   NA 142 01/06/2014 0836   K 3.8 01/06/2014 0836   CL 102 01/06/2014 0836   CO2 33* 01/06/2014 0836   BUN 17 01/06/2014 0836   CREATININE 0.65 01/06/2014 0836   CREATININE 0.56 11/03/2012 0912      Component Value Date/Time   CALCIUM 9.2 01/06/2014 0836   CALCIUM 8.9 10/13/2012 0500   ALKPHOS 68 01/06/2014 0836   AST 21 01/06/2014 0836   ALT 20 01/06/2014 0836   BILITOT 0.4 01/06/2014 0836       Last optometry/ophthalmology exam reviewed from:    Assessment:        Plan:    1.  Rx changes: {none:33079} 2.  Education: Reviewed 'ABCs' of diabetes management (respective goals in parentheses):  A1C (<7), blood pressure (<130/80), and cholesterol (LDL <100). 3.  Compliance at present is estimated to be {good/fair/poor:33178}. Efforts to improve compliance (if necessary) will be directed at {compliance:16716}. 4. Follow up: {NUMBERS; 0-10:33138} {time:11}

## 2014-09-17 ENCOUNTER — Encounter: Payer: Self-pay | Admitting: Family Medicine

## 2014-09-20 ENCOUNTER — Other Ambulatory Visit: Payer: Self-pay

## 2014-09-20 DIAGNOSIS — E119 Type 2 diabetes mellitus without complications: Secondary | ICD-10-CM

## 2014-09-20 MED ORDER — ATORVASTATIN CALCIUM 10 MG PO TABS: 10.0000 mg | ORAL_TABLET | Freq: Every day | ORAL | Status: DC

## 2014-09-20 MED ORDER — PIOGLITAZONE HCL 30 MG PO TABS
30.0000 mg | ORAL_TABLET | Freq: Every day | ORAL | Status: DC
Start: 2014-09-20 — End: 2015-03-18

## 2014-09-20 MED ORDER — LISINOPRIL-HYDROCHLOROTHIAZIDE 20-12.5 MG PO TABS: 1.0000 | ORAL_TABLET | Freq: Every day | ORAL | Status: DC

## 2014-09-20 MED ORDER — GLUCOSE BLOOD VI STRP: ORAL_STRIP | Status: DC

## 2014-09-27 LAB — HM DIABETES EYE EXAM

## 2014-10-07 ENCOUNTER — Encounter: Payer: Self-pay | Admitting: Family Medicine

## 2014-10-08 ENCOUNTER — Encounter: Payer: Self-pay | Admitting: Internal Medicine

## 2015-01-06 ENCOUNTER — Ambulatory Visit (INDEPENDENT_AMBULATORY_CARE_PROVIDER_SITE_OTHER): Payer: BLUE CROSS/BLUE SHIELD | Admitting: Family Medicine

## 2015-01-06 ENCOUNTER — Encounter: Payer: Self-pay | Admitting: Family Medicine

## 2015-01-06 VITALS — BP 116/74 | HR 64 | Wt 203.0 lb

## 2015-01-06 DIAGNOSIS — E785 Hyperlipidemia, unspecified: Secondary | ICD-10-CM

## 2015-01-06 DIAGNOSIS — E669 Obesity, unspecified: Secondary | ICD-10-CM

## 2015-01-06 DIAGNOSIS — I10 Essential (primary) hypertension: Secondary | ICD-10-CM | POA: Diagnosis not present

## 2015-01-06 DIAGNOSIS — E1169 Type 2 diabetes mellitus with other specified complication: Secondary | ICD-10-CM | POA: Diagnosis not present

## 2015-01-06 DIAGNOSIS — E1159 Type 2 diabetes mellitus with other circulatory complications: Secondary | ICD-10-CM

## 2015-01-06 DIAGNOSIS — I152 Hypertension secondary to endocrine disorders: Secondary | ICD-10-CM

## 2015-01-06 DIAGNOSIS — J302 Other seasonal allergic rhinitis: Secondary | ICD-10-CM

## 2015-01-06 DIAGNOSIS — E119 Type 2 diabetes mellitus without complications: Secondary | ICD-10-CM

## 2015-01-06 LAB — COMPREHENSIVE METABOLIC PANEL
ALK PHOS: 62 U/L (ref 39–117)
ALT: 24 U/L (ref 0–35)
AST: 25 U/L (ref 0–37)
Albumin: 4.1 g/dL (ref 3.5–5.2)
BILIRUBIN TOTAL: 0.4 mg/dL (ref 0.2–1.2)
BUN: 22 mg/dL (ref 6–23)
CHLORIDE: 102 meq/L (ref 96–112)
CO2: 30 meq/L (ref 19–32)
Calcium: 9.8 mg/dL (ref 8.4–10.5)
Creat: 0.61 mg/dL (ref 0.50–1.10)
Glucose, Bld: 128 mg/dL — ABNORMAL HIGH (ref 70–99)
POTASSIUM: 4.5 meq/L (ref 3.5–5.3)
Sodium: 140 mEq/L (ref 135–145)
Total Protein: 6.3 g/dL (ref 6.0–8.3)

## 2015-01-06 LAB — CBC WITH DIFFERENTIAL/PLATELET
BASOS ABS: 0 10*3/uL (ref 0.0–0.1)
Basophils Relative: 0 % (ref 0–1)
EOS PCT: 3 % (ref 0–5)
Eosinophils Absolute: 0.2 10*3/uL (ref 0.0–0.7)
HCT: 35.4 % — ABNORMAL LOW (ref 36.0–46.0)
HEMOGLOBIN: 11.5 g/dL — AB (ref 12.0–15.0)
Lymphocytes Relative: 27 % (ref 12–46)
Lymphs Abs: 1.4 10*3/uL (ref 0.7–4.0)
MCH: 30.2 pg (ref 26.0–34.0)
MCHC: 32.5 g/dL (ref 30.0–36.0)
MCV: 92.9 fL (ref 78.0–100.0)
MPV: 9.7 fL (ref 8.6–12.4)
Monocytes Absolute: 0.4 10*3/uL (ref 0.1–1.0)
Monocytes Relative: 8 % (ref 3–12)
Neutro Abs: 3.3 10*3/uL (ref 1.7–7.7)
Neutrophils Relative %: 62 % (ref 43–77)
PLATELETS: 216 10*3/uL (ref 150–400)
RBC: 3.81 MIL/uL — AB (ref 3.87–5.11)
RDW: 13.9 % (ref 11.5–15.5)
WBC: 5.3 10*3/uL (ref 4.0–10.5)

## 2015-01-06 LAB — LIPID PANEL
CHOL/HDL RATIO: 3.1 ratio
Cholesterol: 133 mg/dL (ref 0–200)
HDL: 43 mg/dL — AB (ref >=46)
LDL Cholesterol: 74 mg/dL (ref 0–99)
TRIGLYCERIDES: 80 mg/dL (ref <150)
VLDL: 16 mg/dL (ref 0–40)

## 2015-01-06 LAB — POCT GLYCOSYLATED HEMOGLOBIN (HGB A1C): HEMOGLOBIN A1C: 6.2

## 2015-01-06 NOTE — Progress Notes (Signed)
  Subjective:    Patient ID: [name], female    DOB: 10-15-1951, 64 y.o.   MRN: [id number]  [name] is a 64 y.o. female who presents for follow-up of Type 2 diabetes mellitus.  Home blood sugar records: Patient test one time a day Current symptoms/problems None Daily foot checks:   Any foot concerns: pain top of left foot.The pain is intermittent in nature and does not interfere with her daily activities Exercise: walking EYE:09/27/14 The following portions of the patient's history were reviewed and updated as appropriate: allergies, current medications, past medical history, past social history and problem list.  ROS as in subjective above.     Objective:    Physical Exam Alert and in no distress otherwise not examined.  Lab Review Diabetic Labs Latest Ref Rng 09/07/2014 05/04/2014 01/06/2014 09/10/2013 03/10/2013  HbA1c - 6.5 6.2 6.4 6.1 6.5  Chol 0 - 200 mg/dL - - 122 - -  HDL >39 mg/dL - - 50 - -  Calc LDL 0 - 99 mg/dL - - 56 - -  Triglycerides <150 mg/dL - - 78 - -  Creatinine 0.50 - 1.10 mg/dL - - 0.65 - -   BP/Weight 09/07/2014 06/30/2014 06/10/2014 05/19/2014 01/28/8118  Systolic BP 147 - 829 562 130  Diastolic BP 80 - 70 60 70  Wt. (Lbs) 201 206.9 203 206 208  BMI 34.48 35.5 35.39 35.08 35.42   Foot/eye exam completion dates Latest Ref Rng 09/27/2014 09/07/2014  Eye Exam No Retinopathy No Retinopathy -  Foot Form Completion - - Done  Foot exam today is negative  [name]  reports that she has never smoked. She has never used smokeless tobacco. She reports that she does not drink alcohol or use illicit drugs. Hemoglobin A1c is 6.2    Assessment & Plan:    Obesity (BMI 30-39.9)  Hypertension associated with diabetes - Plan: CBC with Differential/Platelet, Comprehensive metabolic panel  Hyperlipidemia associated with type 2 diabetes mellitus - Plan: Lipid panel  Type 2 diabetes mellitus without complication - Plan: POCT glycosylated hemoglobin (Hb A1C), CBC  with Differential/Platelet, Comprehensive metabolic panel, Lipid panel, CANCELED: POCT UA - Microalbumin  Allergic rhinitis, seasonal   1. Rx changes: none 2. Education: Reviewed 'ABCs' of diabetes management (respective goals in parentheses):  A1C (<7), blood pressure (<130/80), and cholesterol (LDL <100). 3. Compliance at present is estimated to be good. Efforts to improve compliance (if necessary) will be directed at increased exercise. 4. Follow up: 4 months

## 2015-03-10 ENCOUNTER — Telehealth: Payer: Self-pay | Admitting: Family Medicine

## 2015-03-10 NOTE — Telephone Encounter (Signed)
Pt dropped off biometric screening form to be signed by Dr Redmond School and then faxed to [facility]. Pt has completed the form with the numbers from 01/06/15 labs in MyChart and her BP the she rechecked today.

## 2015-03-18 ENCOUNTER — Other Ambulatory Visit: Payer: Self-pay | Admitting: Family Medicine

## 2015-04-08 ENCOUNTER — Other Ambulatory Visit: Payer: Self-pay | Admitting: Family Medicine

## 2015-04-08 ENCOUNTER — Ambulatory Visit (INDEPENDENT_AMBULATORY_CARE_PROVIDER_SITE_OTHER): Payer: BLUE CROSS/BLUE SHIELD | Admitting: Medical

## 2015-04-08 ENCOUNTER — Encounter: Payer: Self-pay | Admitting: Medical

## 2015-04-08 VITALS — BP 120/80 | HR 57 | Temp 98.1°F | Resp 14 | Wt 203.0 lb

## 2015-04-08 DIAGNOSIS — L03031 Cellulitis of right toe: Secondary | ICD-10-CM | POA: Diagnosis not present

## 2015-04-08 MED ORDER — CEPHALEXIN 500 MG PO CAPS: 500.0000 mg | ORAL_CAPSULE | Freq: Three times a day (TID) | ORAL | Status: DC

## 2015-04-08 NOTE — Progress Notes (Signed)
Subjective: Here for right foot toe pain.   Few days ago started getting redness and pain of right foot, 3rd toe.  Denies injury or trauma.  Just started out of the blue.  She is diabetic, but sugars are controlled.  Using some and water and tried squeezing the toe, got some pus out.  No fever, no NVD.  No other aggravating or relieving factors. No other complaint.  Objective; BP 120/80 mmHg  Pulse 57  Temp(Src) 98.1 F (36.7 C) (Oral)  Resp 14  Wt 203 lb (92.08 kg)  Gen: wd, wn, nad Skin: right dorsal 3rd toe lateral nail bed with warm erythematous fluctuance area c/w paronychia   Assessment: Encounter Diagnosis  Name Primary?  . Paronychia, toe, right Yes    Plan: Discussed exam findings consistent with paronychia.   Discussed treatment options. Discussed I&D, risks/benefits of procedure, and she consents to procedure.   Cleaned and prepped right 3rd toe in usual sterile fashion.  Used ethyl acetate for local anesthesia topically. Used #11 blade for incision of the swollen fluctuance area of the nail bed.  <1 cc of blood without purulent material expressed.  Cleaned the toe, covered with sterile bandage.  Patient tolerated procedure well.  EBL < 1cc.   Discussed hygiene, foot care, begin Keflex antibiotic, can use Tylenol for pain, soap water foot soaks the next several days.   If not much improved in 3-4 days, recheck.     Discussed cutting toenails straight across in general, avoiding ingrowing nail.

## 2015-04-18 ENCOUNTER — Other Ambulatory Visit: Payer: Self-pay

## 2015-05-04 ENCOUNTER — Encounter: Payer: Self-pay | Admitting: Family Medicine

## 2015-05-10 ENCOUNTER — Encounter: Payer: Self-pay | Admitting: Family Medicine

## 2015-05-10 ENCOUNTER — Ambulatory Visit (INDEPENDENT_AMBULATORY_CARE_PROVIDER_SITE_OTHER): Payer: BLUE CROSS/BLUE SHIELD | Admitting: Family Medicine

## 2015-05-10 VITALS — BP 118/80 | HR 64 | Ht 64.0 in | Wt 202.0 lb

## 2015-05-10 DIAGNOSIS — E785 Hyperlipidemia, unspecified: Secondary | ICD-10-CM | POA: Diagnosis not present

## 2015-05-10 DIAGNOSIS — E669 Obesity, unspecified: Secondary | ICD-10-CM | POA: Diagnosis not present

## 2015-05-10 DIAGNOSIS — E119 Type 2 diabetes mellitus without complications: Secondary | ICD-10-CM | POA: Diagnosis not present

## 2015-05-10 DIAGNOSIS — I10 Essential (primary) hypertension: Secondary | ICD-10-CM

## 2015-05-10 DIAGNOSIS — E1169 Type 2 diabetes mellitus with other specified complication: Secondary | ICD-10-CM

## 2015-05-10 DIAGNOSIS — E1159 Type 2 diabetes mellitus with other circulatory complications: Secondary | ICD-10-CM

## 2015-05-10 DIAGNOSIS — J302 Other seasonal allergic rhinitis: Secondary | ICD-10-CM | POA: Diagnosis not present

## 2015-05-10 LAB — POCT URINALYSIS DIPSTICK
BILIRUBIN UA: NEGATIVE
Blood, UA: NEGATIVE
Glucose, UA: NEGATIVE
Ketones, UA: NEGATIVE
Leukocytes, UA: NEGATIVE
Nitrite, UA: NEGATIVE
Protein, UA: NEGATIVE
Spec Grav, UA: 1.015
UROBILINOGEN UA: NEGATIVE
pH, UA: 6

## 2015-05-10 NOTE — Progress Notes (Signed)
  Subjective:    Patient ID: [name], female    DOB: Apr 28, 1951, 64 y.o.   MRN: [id number]  ORTHA METTS is a 64 y.o. female who presents for follow-up of Type 2 diabetes mellitus. She also has underlying allergies and they are under good control.  Home blood sugar records: Patient test one time a day Current symptoms/problems None Daily foot checks:yes   Any foot concerns: none Exercise: none Eye:09/27/2014 The following portions of the patient's history were reviewed and updated as appropriate: allergies, current medications, past medical history, past social history and problem list.  ROS as in subjective above.     Objective:    Physical Exam Alert and in no distress otherwise not examined.  Lab Review Diabetic Labs Latest Ref Rng 01/06/2015 09/07/2014 05/04/2014 01/06/2014 09/10/2013  HbA1c - 6.2 6.5 6.2 6.4 6.1  Chol 0 - 200 mg/dL 133 - - 122 -  HDL >=46 mg/dL 43(L) - - 50 -  Calc LDL 0 - 99 mg/dL 74 - - 56 -  Triglycerides <150 mg/dL 80 - - 78 -  Creatinine 0.50 - 1.10 mg/dL 0.61 - - 0.65 -   BP/Weight 04/08/2015 01/06/2015 09/07/2014 06/30/2014 11/23/3341  Systolic BP 568 616 837 - 290  Diastolic BP 80 74 80 - 70  Wt. (Lbs) 203 203 201 206.9 203  BMI 34.83 34.83 34.48 35.5 35.39   Foot/eye exam completion dates Latest Ref Rng 01/06/2015 09/27/2014  Eye Exam No Retinopathy - No Retinopathy  Foot Form Completion - Done -    Angelyn  reports that she has never smoked. She has never used smokeless tobacco. She reports that she does not drink alcohol or use illicit drugs.     Assessment & Plan:    Type 2 diabetes mellitus without complication - Plan: DG Bone Density, POCT Urinalysis Dipstick, Microalbumin/Creatinine Ratio, Urine, CANCELED: Microalbumin/Creatinine Ratio, Urine  Hypertension associated with diabetes  Hyperlipidemia associated with type 2 diabetes mellitus  Obesity (BMI 30-39.9) - Plan: DG Bone Density  Allergic rhinitis, seasonal   1. Rx changes:  none 2. Education: Reviewed 'ABCs' of diabetes management (respective goals in parentheses):  A1C (<7), blood pressure (<130/80), and cholesterol (LDL <100). 3. Compliance at present is estimated to be good. Efforts to improve compliance (if necessary) will be directed at increased exercise. 4. Follow up: 4 months overall she seems to be taking very good care of herself. I will do an hemoglobin A1c with her next visit.

## 2015-05-11 LAB — MICROALBUMIN / CREATININE URINE RATIO
CREATININE, URINE: 45.7 mg/dL
MICROALB UR: 0.3 mg/dL (ref <2.0)
Microalb Creat Ratio: 6.6 mg/g (ref 0.0–30.0)

## 2015-05-15 ENCOUNTER — Encounter: Payer: Self-pay | Admitting: Family Medicine

## 2015-05-16 ENCOUNTER — Other Ambulatory Visit: Payer: Self-pay

## 2015-05-16 LAB — HM DEXA SCAN

## 2015-05-16 MED ORDER — PIOGLITAZONE HCL 30 MG PO TABS: 30.0000 mg | ORAL_TABLET | Freq: Every day | ORAL | Status: DC

## 2015-05-26 ENCOUNTER — Encounter: Payer: Self-pay | Admitting: Family Medicine

## 2015-09-13 ENCOUNTER — Ambulatory Visit (INDEPENDENT_AMBULATORY_CARE_PROVIDER_SITE_OTHER): Payer: BLUE CROSS/BLUE SHIELD | Admitting: Family Medicine

## 2015-09-13 ENCOUNTER — Encounter: Payer: Self-pay | Admitting: Family Medicine

## 2015-09-13 VITALS — BP 116/74 | HR 78 | Ht 64.0 in | Wt 202.0 lb

## 2015-09-13 DIAGNOSIS — E1159 Type 2 diabetes mellitus with other circulatory complications: Secondary | ICD-10-CM

## 2015-09-13 DIAGNOSIS — E118 Type 2 diabetes mellitus with unspecified complications: Secondary | ICD-10-CM | POA: Diagnosis not present

## 2015-09-13 DIAGNOSIS — E785 Hyperlipidemia, unspecified: Secondary | ICD-10-CM

## 2015-09-13 DIAGNOSIS — E669 Obesity, unspecified: Secondary | ICD-10-CM | POA: Diagnosis not present

## 2015-09-13 DIAGNOSIS — Z23 Encounter for immunization: Secondary | ICD-10-CM | POA: Diagnosis not present

## 2015-09-13 DIAGNOSIS — E1169 Type 2 diabetes mellitus with other specified complication: Secondary | ICD-10-CM

## 2015-09-13 DIAGNOSIS — I10 Essential (primary) hypertension: Secondary | ICD-10-CM | POA: Diagnosis not present

## 2015-09-13 LAB — POCT GLYCOSYLATED HEMOGLOBIN (HGB A1C): HEMOGLOBIN A1C: 6.4

## 2015-09-13 NOTE — Progress Notes (Signed)
  Subjective:    Patient ID: [name], female    DOB: 08-18-1951, 64 y.o.   MRN: [id number]  KAMAIYAH PICCIRILLI is a 64 y.o. female who presents for follow-up of Type 2 diabetes mellitus.  Home blood sugar records: Patient test one time a day Current symptoms/problems none Daily foot checks:yes   Any foot concerns: dry cracking on soles of feet Exercise:  None just work Eye 09/27/2014 The following portions of the patient's history were reviewed and updated as appropriate: allergies, current medications, past medical history, past social history and problem list.  ROS as in subjective above.     Objective:    Physical Exam Alert and in no distress otherwise not examined.   Lab Review Diabetic Labs Latest Ref Rng 05/10/2015 01/06/2015 09/07/2014 05/04/2014 01/06/2014  HbA1c - - 6.2 6.5 6.2 6.4  Microalbumin <2.0 mg/dL 0.3 - - - -  Micro/Creat Ratio 0.0 - 30.0 mg/g 6.6 - - - -  Chol 0 - 200 mg/dL - 133 - - 122  HDL >=46 mg/dL - 43(L) - - 50  Calc LDL 0 - 99 mg/dL - 74 - - 56  Triglycerides <150 mg/dL - 80 - - 78  Creatinine 0.50 - 1.10 mg/dL - 0.61 - - 0.65   BP/Weight 05/10/2015 04/08/2015 01/06/2015 [id number]  Systolic BP 123456 123456 [id number] -  Diastolic BP 80 80 74 80 -  Wt. (Lbs) 202 203 203 201 206.9  BMI 34.66 34.83 34.83 34.48 35.5   Foot/eye exam completion dates Latest Ref Rng 01/06/2015 09/27/2014  Eye Exam No Retinopathy - No Retinopathy  Foot Form Completion - Done -  A1C 6.4  Alyxandria  reports that she has never smoked. She has never used smokeless tobacco. She reports that she does not drink alcohol or use illicit drugs.     Assessment & Plan:    Need for prophylactic vaccination and inoculation against influenza - Plan: Flu vaccine HIGH DOSE PF (Fluzone High dose)  Controlled type 2 diabetes mellitus with complication, without long-term current use of insulin ([place]) - Plan: POCT glycosylated hemoglobin (Hb A1C)  Hyperlipidemia associated with type 2  diabetes mellitus ([place])  Hypertension associated with diabetes ([place])  Obesity (BMI 30-39.9)    1. Rx changes: none 2. Education: Reviewed 'ABCs' of diabetes management (respective goals in parentheses):  A1C (<7), blood pressure (<130/80), and cholesterol (LDL <100). 3. Compliance at present is estimated to be good. Efforts to improve compliance (if necessary) will be directed at increased exercise. 4. Follow up: 4 months

## 2015-09-26 ENCOUNTER — Telehealth: Payer: Self-pay | Admitting: Family Medicine

## 2015-09-26 NOTE — Telephone Encounter (Signed)
Pt called and was wanting to know if you could send her something in for her cold, she was just here on 09/13/2015, she has had bad cough, with mucus coming up, running nose, and also said she has some sore in her nose like a cut in it and feels like it is not healing, said she would come in if needed, pt can be reached [phone] and uses [id number] - [place], [place] - [address] AT [address].

## 2015-09-26 NOTE — Telephone Encounter (Signed)
Have her come in for an appointment

## 2015-09-27 ENCOUNTER — Ambulatory Visit (INDEPENDENT_AMBULATORY_CARE_PROVIDER_SITE_OTHER): Payer: BLUE CROSS/BLUE SHIELD | Admitting: Family Medicine

## 2015-09-27 ENCOUNTER — Encounter: Payer: Self-pay | Admitting: Family Medicine

## 2015-09-27 VITALS — BP 110/70 | HR 72 | Temp 98.6°F | Ht 64.0 in | Wt 204.4 lb

## 2015-09-27 DIAGNOSIS — E118 Type 2 diabetes mellitus with unspecified complications: Secondary | ICD-10-CM

## 2015-09-27 DIAGNOSIS — J3489 Other specified disorders of nose and nasal sinuses: Secondary | ICD-10-CM

## 2015-09-27 DIAGNOSIS — J302 Other seasonal allergic rhinitis: Secondary | ICD-10-CM

## 2015-09-27 DIAGNOSIS — J209 Acute bronchitis, unspecified: Secondary | ICD-10-CM

## 2015-09-27 MED ORDER — AMOXICILLIN 875 MG PO TABS: 875.0000 mg | ORAL_TABLET | Freq: Two times a day (BID) | ORAL | Status: DC

## 2015-09-27 NOTE — Progress Notes (Signed)
   Subjective:    Patient ID: [name], female    DOB: 1951-08-09, 64 y.o.   MRN: [id number]  HPI  she complains of an 8 day history this started with a dry cough followed by rhinorrhea, sore throat, chills with sinus pressure and right-sided nasal soreness. No earache. She then developed a slightly productive cough. She does not smoke, does have underlying allergies. Her blood sugars have been doing well.   Review of Systems     Objective:   Physical Exam Alert and in no distress. Tympanic membranes and canals are normal. Pharyngeal area is normal. Neck is supple without adenopathy or thyromegaly. Cardiac exam shows a regular sinus rhythm without murmurs or gallops. Lungs are clear to auscultation. Red and raw area at the right nares. Sinuses are nontender        Assessment & Plan:  Controlled type 2 diabetes mellitus with complication, without long-term current use of insulin (HCC)  Acute bronchitis, unspecified organism - Plan: amoxicillin (AMOXIL) 875 MG tablet  Nasal lesion  Allergic rhinitis, seasonal  continue on her present medication regimen. She will take all the Amoxil and if no improvement, call me.

## 2015-09-27 NOTE — Patient Instructions (Signed)
An antibiotic ointment on the nose. Take all the antibiotic and if not totally back to normal when you get done call me. Keep track of your diabetes

## 2015-09-27 NOTE — Telephone Encounter (Signed)
Left message on pt's vm stating she needs to come in for a sick visit and to call back and schedule an appt

## 2015-10-07 ENCOUNTER — Encounter: Payer: BLUE CROSS/BLUE SHIELD | Admitting: Medical

## 2015-11-22 LAB — HM DIABETES EYE EXAM

## 2015-11-28 ENCOUNTER — Encounter: Payer: Self-pay | Admitting: Internal Medicine

## 2015-12-01 ENCOUNTER — Encounter (INDEPENDENT_AMBULATORY_CARE_PROVIDER_SITE_OTHER): Payer: BLUE CROSS/BLUE SHIELD | Admitting: Ophthalmology

## 2015-12-01 DIAGNOSIS — H35033 Hypertensive retinopathy, bilateral: Secondary | ICD-10-CM | POA: Diagnosis not present

## 2015-12-01 DIAGNOSIS — H40033 Anatomical narrow angle, bilateral: Secondary | ICD-10-CM | POA: Diagnosis not present

## 2015-12-01 DIAGNOSIS — H2513 Age-related nuclear cataract, bilateral: Secondary | ICD-10-CM | POA: Diagnosis not present

## 2015-12-01 DIAGNOSIS — H35372 Puckering of macula, left eye: Secondary | ICD-10-CM

## 2015-12-01 DIAGNOSIS — H353111 Nonexudative age-related macular degeneration, right eye, early dry stage: Secondary | ICD-10-CM

## 2015-12-01 DIAGNOSIS — I10 Essential (primary) hypertension: Secondary | ICD-10-CM

## 2015-12-01 DIAGNOSIS — H43813 Vitreous degeneration, bilateral: Secondary | ICD-10-CM | POA: Diagnosis not present

## 2015-12-02 ENCOUNTER — Telehealth: Payer: Self-pay | Admitting: Family Medicine

## 2015-12-02 NOTE — Telephone Encounter (Signed)
Pt is aware.  

## 2015-12-02 NOTE — Telephone Encounter (Signed)
Pt called and was seen by the retina dr and they told her to take a supplement Areds-2 and told her it wouldn't affect her other meds but on the paper work it said to get permission from her primary dr so she wanted to make sure it was ok with you before she went and bought it at the over the counter.. It is a eye vitamin and mineral supplement, she just wanted to check with you, first, i told her you was out of this office till Monday and she said she would wait to hear from you then, pt can be reached at [phone]

## 2015-12-02 NOTE — Telephone Encounter (Signed)
This is okay.

## 2016-01-04 ENCOUNTER — Encounter: Payer: Self-pay | Admitting: Family Medicine

## 2016-01-11 ENCOUNTER — Encounter: Payer: Self-pay | Admitting: *Deleted

## 2016-01-11 ENCOUNTER — Telehealth: Payer: Self-pay | Admitting: Family Medicine

## 2016-01-11 ENCOUNTER — Encounter: Payer: Self-pay | Admitting: Family Medicine

## 2016-01-11 ENCOUNTER — Ambulatory Visit (INDEPENDENT_AMBULATORY_CARE_PROVIDER_SITE_OTHER): Payer: BLUE CROSS/BLUE SHIELD | Admitting: Family Medicine

## 2016-01-11 VITALS — BP 124/82 | HR 56 | Resp 12 | Ht 64.0 in | Wt 205.8 lb

## 2016-01-11 DIAGNOSIS — J302 Other seasonal allergic rhinitis: Secondary | ICD-10-CM

## 2016-01-11 DIAGNOSIS — I10 Essential (primary) hypertension: Secondary | ICD-10-CM

## 2016-01-11 DIAGNOSIS — E1159 Type 2 diabetes mellitus with other circulatory complications: Secondary | ICD-10-CM | POA: Diagnosis not present

## 2016-01-11 DIAGNOSIS — E118 Type 2 diabetes mellitus with unspecified complications: Secondary | ICD-10-CM

## 2016-01-11 DIAGNOSIS — E785 Hyperlipidemia, unspecified: Secondary | ICD-10-CM

## 2016-01-11 DIAGNOSIS — E1169 Type 2 diabetes mellitus with other specified complication: Secondary | ICD-10-CM | POA: Diagnosis not present

## 2016-01-11 DIAGNOSIS — Z1159 Encounter for screening for other viral diseases: Secondary | ICD-10-CM

## 2016-01-11 DIAGNOSIS — E669 Obesity, unspecified: Secondary | ICD-10-CM

## 2016-01-11 DIAGNOSIS — H40039 Anatomical narrow angle, unspecified eye: Secondary | ICD-10-CM

## 2016-01-11 DIAGNOSIS — I152 Hypertension secondary to endocrine disorders: Secondary | ICD-10-CM

## 2016-01-11 LAB — COMPREHENSIVE METABOLIC PANEL
ALBUMIN: 4 g/dL (ref 3.6–5.1)
ALK PHOS: 64 U/L (ref 33–130)
ALT: 22 U/L (ref 6–29)
AST: 22 U/L (ref 10–35)
BILIRUBIN TOTAL: 0.6 mg/dL (ref 0.2–1.2)
BUN: 16 mg/dL (ref 7–25)
CALCIUM: 9.4 mg/dL (ref 8.6–10.4)
CO2: 29 mmol/L (ref 20–31)
CREATININE: 0.59 mg/dL (ref 0.50–0.99)
Chloride: 102 mmol/L (ref 98–110)
Glucose, Bld: 112 mg/dL — ABNORMAL HIGH (ref 65–99)
Potassium: 3.8 mmol/L (ref 3.5–5.3)
Sodium: 142 mmol/L (ref 135–146)
TOTAL PROTEIN: 6.5 g/dL (ref 6.1–8.1)

## 2016-01-11 LAB — CBC WITH DIFFERENTIAL/PLATELET
Basophils Absolute: 0 10*3/uL (ref 0.0–0.1)
Basophils Relative: 1 % (ref 0–1)
Eosinophils Absolute: 0.2 10*3/uL (ref 0.0–0.7)
Eosinophils Relative: 4 % (ref 0–5)
HEMATOCRIT: 35.5 % — AB (ref 36.0–46.0)
HEMOGLOBIN: 11.6 g/dL — AB (ref 12.0–15.0)
LYMPHS ABS: 1 10*3/uL (ref 0.7–4.0)
Lymphocytes Relative: 26 % (ref 12–46)
MCH: 29.7 pg (ref 26.0–34.0)
MCHC: 32.7 g/dL (ref 30.0–36.0)
MCV: 91 fL (ref 78.0–100.0)
MONOS PCT: 9 % (ref 3–12)
MPV: 9.8 fL (ref 8.6–12.4)
Monocytes Absolute: 0.4 10*3/uL (ref 0.1–1.0)
NEUTROS ABS: 2.3 10*3/uL (ref 1.7–7.7)
NEUTROS PCT: 60 % (ref 43–77)
Platelets: 222 10*3/uL (ref 150–400)
RBC: 3.9 MIL/uL (ref 3.87–5.11)
RDW: 14.3 % (ref 11.5–15.5)
WBC: 3.9 10*3/uL — ABNORMAL LOW (ref 4.0–10.5)

## 2016-01-11 LAB — LIPID PANEL
CHOLESTEROL: 130 mg/dL (ref 125–200)
HDL: 51 mg/dL (ref >=46)
LDL Cholesterol: 67 mg/dL (ref <130)
TRIGLYCERIDES: 62 mg/dL (ref <150)
Total CHOL/HDL Ratio: 2.5 Ratio (ref <=5.0)
VLDL: 12 mg/dL (ref <30)

## 2016-01-11 LAB — POCT UA - MICROALBUMIN
ALBUMIN/CREATININE RATIO, URINE, POC: 5
CREATININE, POC: 112.7 mg/dL
Microalbumin Ur, POC: <4.4 mg/L

## 2016-01-11 LAB — POCT GLYCOSYLATED HEMOGLOBIN (HGB A1C): HEMOGLOBIN A1C: 6.5

## 2016-01-11 MED ORDER — LISINOPRIL-HYDROCHLOROTHIAZIDE 20-12.5 MG PO TABS: 1.0000 | ORAL_TABLET | Freq: Every day | ORAL | Status: DC

## 2016-01-11 MED ORDER — ATORVASTATIN CALCIUM 10 MG PO TABS: 10.0000 mg | ORAL_TABLET | Freq: Every day | ORAL | Status: DC

## 2016-01-11 MED ORDER — PIOGLITAZONE HCL 30 MG PO TABS: 30.0000 mg | ORAL_TABLET | Freq: Every day | ORAL | Status: DC

## 2016-01-11 NOTE — Progress Notes (Signed)
  Subjective:    Patient ID: [name], female    DOB: 1950-12-30, 65 y.o.   MRN: [id number]  [name] is a 65 y.o. female who presents for follow-up of Type 2 diabetes mellitus.she does follow-up with ophthalmology regularly and apparently is being now treated for narrow angle glaucoma.  Patient is checking home blood sugars.   Home blood sugar records: BGs range between 98 and 126 How often is blood sugars being checked: 1  Current symptoms/problems include none and have been stable. Daily foot checks: yes   Any foot concerns: no Last eye exam: January 2017 Exercise: The patient has a physically strenuous job, but has no regular exercise apart from work.  Her diet is essentially unchanged and she recognizes that she has not been compliant. The following portions of the patient's history were reviewed and updated as appropriate: allergies, current medications, past medical history, past social history and problem list.  ROS as in subjective above.     Objective:    Physical Exam Alert and in no distress.foot exam is recorded and is normal   Lab Review Diabetic Labs Latest Ref Rng 09/13/2015 05/10/2015 01/06/2015 09/07/2014 05/04/2014  HbA1c - 6.4 - 6.2 6.5 6.2  Microalbumin <2.0 mg/dL - 0.3 - - -  Micro/Creat Ratio 0.0 - 30.0 mg/g - 6.6 - - -  Chol 0 - 200 mg/dL - - 133 - -  HDL >=46 mg/dL - - 43(L) - -  Calc LDL 0 - 99 mg/dL - - 74 - -  Triglycerides <150 mg/dL - - 80 - -  Creatinine 0.50 - 1.10 mg/dL - - 0.61 - -   BP/Weight 09/27/2015 09/13/2015 05/10/2015 04/08/2015 [id number]  Systolic BP [id number] 123456 123456 99991111  Diastolic BP 70 74 80 80 74  Wt. (Lbs) 204.4 202 202 203 203  BMI 35.07 34.66 34.66 34.83 34.83   Foot/eye exam completion dates Latest Ref Rng 01/06/2015 09/27/2014  Eye Exam No Retinopathy - No Retinopathy  Foot Form Completion - Done -  A1c is 6.5  [name]  reports that she has never smoked. She has never used smokeless tobacco. She reports that she does  not drink alcohol or use illicit drugs.     Assessment & Plan:    Controlled type 2 diabetes mellitus with complication, without long-term current use of insulin ([place]) - Plan: POCT glycosylated hemoglobin (Hb A1C), CBC with Differential/Platelet, Comprehensive metabolic panel, Lipid panel, POCT UA - Microalbumin  Obesity (BMI 30-39.9)  Hypertension associated with diabetes ([place]) - Plan: CBC with Differential/Platelet, Comprehensive metabolic panel  Hyperlipidemia associated with type 2 diabetes mellitus ([place]) - Plan: Lipid panel  Allergic rhinitis, seasonal  Need for hepatitis C screening test - Plan: Hepatitis C antibody   1. Rx changes: none 2. Education: Reviewed 'ABCs' of diabetes management (respective goals in parentheses):  A1C (<7), blood pressure (<130/80), and cholesterol (LDL <100). 3. Compliance at present is estimated to be good. Efforts to improve compliance (if necessary) will be directed at dietary modifications: cut back on portion sizes. 4. Follow up: 4 months

## 2016-01-11 NOTE — Telephone Encounter (Signed)
Pt was seen today and want to make sure that refills are sent in on test strips, lancets, Atorvastatin 10 mg, Lisinopril 20-12.5 mg, Actos 30 mg to [facility]

## 2016-01-12 LAB — HEPATITIS C ANTIBODY: HCV AB: NEGATIVE

## 2016-02-14 ENCOUNTER — Other Ambulatory Visit: Payer: Self-pay

## 2016-02-14 ENCOUNTER — Telehealth: Payer: Self-pay | Admitting: Family Medicine

## 2016-02-14 MED ORDER — GLUCOSE BLOOD VI STRP: ORAL_STRIP | Status: DC

## 2016-02-14 MED ORDER — ONETOUCH LANCETS MISC: Status: DC

## 2016-02-14 NOTE — Telephone Encounter (Signed)
Sent in lancets and strips.

## 2016-02-14 NOTE — Telephone Encounter (Signed)
Pt requesting refill on test strips & lancets

## 2016-04-03 ENCOUNTER — Ambulatory Visit (HOSPITAL_COMMUNITY)
Admission: RE | Admit: 2016-04-03 | Discharge: 2016-04-03 | Disposition: A | Discharge status: Home / Self Care | Payer: BLUE CROSS/BLUE SHIELD | Source: Ambulatory Visit | Attending: Family Medicine | Admitting: Family Medicine

## 2016-04-03 ENCOUNTER — Encounter: Payer: Self-pay | Admitting: Family Medicine

## 2016-04-03 ENCOUNTER — Ambulatory Visit (INDEPENDENT_AMBULATORY_CARE_PROVIDER_SITE_OTHER): Payer: BLUE CROSS/BLUE SHIELD | Admitting: Family Medicine

## 2016-04-03 ENCOUNTER — Telehealth: Payer: Self-pay | Admitting: Family Medicine

## 2016-04-03 VITALS — BP 120/70 | HR 60 | Wt 204.0 lb

## 2016-04-03 DIAGNOSIS — I10 Essential (primary) hypertension: Secondary | ICD-10-CM | POA: Insufficient documentation

## 2016-04-03 DIAGNOSIS — E119 Type 2 diabetes mellitus without complications: Secondary | ICD-10-CM | POA: Diagnosis not present

## 2016-04-03 DIAGNOSIS — E785 Hyperlipidemia, unspecified: Secondary | ICD-10-CM | POA: Insufficient documentation

## 2016-04-03 DIAGNOSIS — R609 Edema, unspecified: Secondary | ICD-10-CM | POA: Insufficient documentation

## 2016-04-03 DIAGNOSIS — E669 Obesity, unspecified: Secondary | ICD-10-CM | POA: Insufficient documentation

## 2016-04-03 NOTE — Telephone Encounter (Signed)
Left message on pt machine word for word

## 2016-04-03 NOTE — Telephone Encounter (Signed)
[facility] called to let Dr Redmond School know that results are available in Epic and results are negative

## 2016-04-03 NOTE — Progress Notes (Signed)
*  Preliminary Results* Bilateral lower extremity venous duplex completed. Bilateral lower extremities are negative for deep vein thrombosis. There is no evidence of Baker's cyst bilaterally.  04/03/2016  Maudry Mayhew, RVT, RDCS, RDMS

## 2016-04-03 NOTE — Telephone Encounter (Signed)
Let her know that the test is negative. Have her use support stockings as much as possible.

## 2016-04-03 NOTE — Progress Notes (Signed)
Subjective:     Patient ID: [name], female   DOB: 08/12/1951, 65 y.o.   MRN: [id number]  HPI Ms. Stankowski presents with a 5d history of right leg swelling and a 2d history of left leg swelling with minor intermittent dorsal foot pain in the left leg 2 days ago. However, she says her swelling and pain the left leg has completely resolved and her right leg is around 75% better. She has type 2 DM and has had two surgeries on her right leg for a broken patella and a broken tibia. 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  She says she has some difficulty breathing when lying on her back, but denies dyspnea on exertion, SOB, chest pain, leg pain, foamy urine, easy bruising, and has not started any new medications.  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  She is a non-smoker and does not drink alcohol.    Review of Systems     Objective:   Physical Exam Alert and in no distress.  Cardiac exam shows a regular sinus rhythm without murmurs or gallops. No visible JVD.  Lungs are clear to auscultation. Mildly edematous and non-erythemaous right leg up to knee most noticeable at ankle with two surgical scars from repair of pattella and tibial fractures.  Negative Homan's sign.   2+ pitting right ankle edema.    Sensation intact in feet and dorsalis pedis palpated bilaterally.      Assessment / Plan:     Edema, unspecified type - Plan: VAS [place] LOWER EXTREMITY VENOUS (DVT), CANCELED: Ultrasound doppler venous legs bilat Will get a doppler U/S of the leg for possible DVT.  If negative, consider dependent edema with right > left given history of surgery in right leg.      History and physical exam conducted by Marylen Ponto (Medical Student) in conjunction with Dr. Redmond School.

## 2016-04-26 ENCOUNTER — Ambulatory Visit (INDEPENDENT_AMBULATORY_CARE_PROVIDER_SITE_OTHER): Payer: Medicare Other | Admitting: Family Medicine

## 2016-04-26 ENCOUNTER — Encounter: Payer: Self-pay | Admitting: Family Medicine

## 2016-04-26 VITALS — BP 120/70 | HR 68 | Temp 98.1°F | Wt 199.4 lb

## 2016-04-26 DIAGNOSIS — R197 Diarrhea, unspecified: Secondary | ICD-10-CM | POA: Diagnosis not present

## 2016-04-26 LAB — CBC WITH DIFFERENTIAL/PLATELET
BASOS PCT: 0 %
Basophils Absolute: 0 cells/uL (ref 0–200)
EOS ABS: 285 {cells}/uL (ref 15–500)
Eosinophils Relative: 5 %
HEMATOCRIT: 35.8 % (ref 35.0–45.0)
Hemoglobin: 11.7 g/dL (ref 11.7–15.5)
LYMPHS PCT: 22 %
Lymphs Abs: 1254 cells/uL (ref 850–3900)
MCH: 30.3 pg (ref 27.0–33.0)
MCHC: 32.7 g/dL (ref 32.0–36.0)
MCV: 92.7 fL (ref 80.0–100.0)
MONO ABS: 513 {cells}/uL (ref 200–950)
MPV: 9.7 fL (ref 7.5–12.5)
Monocytes Relative: 9 %
NEUTROS PCT: 64 %
Neutro Abs: 3648 cells/uL (ref 1500–7800)
Platelets: 217 10*3/uL (ref 140–400)
RBC: 3.86 MIL/uL (ref 3.80–5.10)
RDW: 13.5 % (ref 11.0–15.0)
WBC: 5.7 10*3/uL (ref 4.0–10.5)

## 2016-04-26 LAB — BASIC METABOLIC PANEL
BUN: 14 mg/dL (ref 7–25)
CHLORIDE: 103 mmol/L (ref 98–110)
CO2: 30 mmol/L (ref 20–31)
CREATININE: 0.76 mg/dL (ref 0.50–0.99)
Calcium: 9.5 mg/dL (ref 8.6–10.4)
Glucose, Bld: 103 mg/dL — ABNORMAL HIGH (ref 65–99)
Potassium: 4.3 mmol/L (ref 3.5–5.3)
Sodium: 143 mmol/L (ref 135–146)

## 2016-04-26 NOTE — Patient Instructions (Addendum)
Continue taking immodium as needed, eat the BRAT diet as discussed, stay hydrated. If the diarrhea worsens, is more frequent or if you develop fever or worrisome symptoms call or return. The next step will be to do a stool culture if the diarrhea persists or worsens.    Food Choices to Help Relieve Diarrhea, Adult When you have diarrhea, the foods you eat and your eating habits are very important. Choosing the right foods and drinks can help relieve diarrhea. Also, because diarrhea can last up to 7 days, you need to replace lost fluids and electrolytes (such as sodium, potassium, and chloride) in order to help prevent dehydration.  WHAT GENERAL GUIDELINES DO I NEED TO FOLLOW?  Slowly drink 1 cup (8 oz) of fluid for each episode of diarrhea. If you are getting enough fluid, your urine will be clear or pale yellow.  Eat starchy foods. Some good choices include white rice, white toast, pasta, low-fiber cereal, baked potatoes (without the skin), saltine crackers, and bagels.  Avoid large servings of any cooked vegetables.  Limit fruit to two servings per day. A serving is  cup or 1 small piece.  Choose foods with less than 2 g of fiber per serving.  Limit fats to less than 8 tsp (38 g) per day.  Avoid fried foods.  Eat foods that have probiotics in them. Probiotics can be found in certain dairy products.  Avoid foods and beverages that may increase the speed at which food moves through the stomach and intestines (gastrointestinal tract). Things to avoid include:  High-fiber foods, such as dried fruit, raw fruits and vegetables, nuts, seeds, and whole grain foods.  Spicy foods and high-fat foods.  Foods and beverages sweetened with high-fructose corn syrup, honey, or sugar alcohols such as xylitol, sorbitol, and mannitol. WHAT FOODS ARE RECOMMENDED? Grains White rice. White, [place], or pita breads (fresh or toasted), including plain rolls, buns, or bagels. White pasta. Saltine, soda, or  graham crackers. Pretzels. Low-fiber cereal. Cooked cereals made with water (such as cornmeal, farina, or cream cereals). Plain muffins. Matzo. Melba toast. Zwieback.  Vegetables Potatoes (without the skin). Strained tomato and vegetable juices. Most well-cooked and canned vegetables without seeds. Tender lettuce. Fruits Cooked or canned applesauce, apricots, cherries, fruit cocktail, grapefruit, peaches, pears, or plums. Fresh bananas, apples without skin, cherries, grapes, cantaloupe, grapefruit, peaches, oranges, or plums.  Meat and Other Protein Products Baked or boiled chicken. Eggs. Tofu. Fish. Seafood. Smooth peanut butter. Ground or well-cooked tender beef, ham, veal, lamb, pork, or poultry.  Dairy Plain yogurt, kefir, and unsweetened liquid yogurt. Lactose-free milk, buttermilk, or soy milk. Plain hard cheese. Beverages Sport drinks. Clear broths. Diluted fruit juices (except prune). Regular, caffeine-free sodas such as ginger ale. Water. Decaffeinated teas. Oral rehydration solutions. Sugar-free beverages not sweetened with sugar alcohols. Other Bouillon, broth, or soups made from recommended foods.  The items listed above may not be a complete list of recommended foods or beverages. Contact your dietitian for more options. WHAT FOODS ARE NOT RECOMMENDED? Grains Whole grain, whole wheat, bran, or rye breads, rolls, pastas, crackers, and cereals. Wild or brown rice. Cereals that contain more than 2 g of fiber per serving. Corn tortillas or taco shells. Cooked or dry oatmeal. Granola. Popcorn. Vegetables Raw vegetables. Cabbage, broccoli, Brussels sprouts, artichokes, baked beans, beet greens, corn, kale, legumes, peas, sweet potatoes, and yams. Potato skins. Cooked spinach and cabbage. Fruits Dried fruit, including raisins and dates. Raw fruits. Stewed or dried prunes. Fresh apples with skin,  apricots, mangoes, pears, raspberries, and strawberries.  Meat and Other Protein  Products Chunky peanut butter. Nuts and seeds. Beans and lentils. Berniece Salines.  Dairy High-fat cheeses. Milk, chocolate milk, and beverages made with milk, such as milk shakes. Cream. Ice cream. Sweets and Desserts Sweet rolls, doughnuts, and sweet breads. Pancakes and waffles. Fats and Oils Butter. Cream sauces. Margarine. Salad oils. Plain salad dressings. Olives. Avocados.  Beverages Caffeinated beverages (such as coffee, tea, soda, or energy drinks). Alcoholic beverages. Fruit juices with pulp. Prune juice. Soft drinks sweetened with high-fructose corn syrup or sugar alcohols. Other Coconut. Hot sauce. Chili powder. Mayonnaise. Gravy. Cream-based or milk-based soups.  The items listed above may not be a complete list of foods and beverages to avoid. Contact your dietitian for more information. WHAT SHOULD I DO IF I BECOME DEHYDRATED? Diarrhea can sometimes lead to dehydration. Signs of dehydration include dark urine and dry mouth and skin. If you think you are dehydrated, you should rehydrate with an oral rehydration solution. These solutions can be purchased at pharmacies, retail stores, or online.  Drink -1 cup (120-240 mL) of oral rehydration solution each time you have an episode of diarrhea. If drinking this amount makes your diarrhea worse, try drinking smaller amounts more often. For example, drink 1-3 tsp (5-15 mL) every 5-10 minutes.  A general rule for staying hydrated is to drink 1-2 L of fluid per day. Talk to your health care provider about the specific amount you should be drinking each day. Drink enough fluids to keep your urine clear or pale yellow.   This information is not intended to replace advice given to you by your health care provider. Make sure you discuss any questions you have with your health care provider.   Document Released: 12/29/2003 Document Revised: 10/29/2014 Document Reviewed: 08/31/2013 Elsevier Interactive Patient Education [employer].

## 2016-04-26 NOTE — Progress Notes (Signed)
Subjective:    Patient ID: [name], female    DOB: 1951-01-21, 65 y.o.   MRN: [id number]  HPI Chief Complaint  Patient presents with  . diarrhea    diarrhea, for the last week, foul odor- running   She is here with complaints of a 4 day history of diarrhea that she states is soft stool that is mainly yellowish-gray with a thick "muddy consistency" and malodorous. Reports having 3-4 stools per day for the past 4 days.denies blood or pus in stool.  Onset was a few hours after she went out to dinner at [facility] and and several hours after eating at a "pot luck" lunch. She states that "no one else got sick". She states several hours goes by without a bowel movement and she feels fine. She notes having some mild abdominal cramping and "gurgling" a few minutes prior to having a bowel movement. She also reports some associated chills. States she did have 1 episode of a vomiting yesterday and states this was shortly after eating food that was not prepared well. States she has not had any more vomiting and no nausea or abdominal pain.  Denies fever, dizziness, chest pain, shortness of breath, urinary symptoms.  Denies recent travel, antibiotics or hospitalizations.   Has been taking immodium sporadically. Has been drinking fruit juices.  States her blood sugars have been normal.  Reports she has been drinking plenty of fluids.   Reviewed allergies, medications, past medical, surgical and social history.  Last colonoscopy: 2015- hemorrhoids     Review of Systems Pertinent positives and negatives in the history of present illness.     Objective:   Physical Exam  Constitutional: She is oriented to person, place, and time. She appears well-developed and well-nourished. No distress.  HENT:  Mouth/Throat: Oropharynx is clear and moist and mucous membranes are normal.  Eyes: Conjunctivae are normal. No scleral icterus.  Neck: Normal range of motion. Neck supple.  Cardiovascular: Normal  rate, regular rhythm, normal heart sounds and normal pulses.   Pulmonary/Chest: Effort normal and breath sounds normal.  Abdominal: Soft. Normal appearance and bowel sounds are normal. There is no hepatosplenomegaly. There is no tenderness. There is no rigidity, no rebound, no guarding, no CVA tenderness, no tenderness at McBurney's point and negative Murphy's sign.  Lymphadenopathy:    She has no cervical adenopathy.       Right: No supraclavicular adenopathy present.       Left: No supraclavicular adenopathy present.  Neurological: She is alert and oriented to person, place, and time. She has normal strength. Gait normal.  Skin: Skin is warm and dry. No rash noted. No pallor.  Psychiatric: She has a normal mood and affect. Her speech is normal and behavior is normal. Thought content normal.   BP 120/70 mmHg  Pulse 68  Temp(Src) 98.1 F (36.7 C) (Oral)  Wt 199 lb 6.4 oz (90.447 kg)      Assessment & Plan:  Diarrhea, unspecified type - Plan: CBC with Differential/Platelet, Basic metabolic panel  Discussed that she appears well hydrated and her exam is normal. Discussed possible etiologies. Most likely this is related to a viral illness and not food related since no one else who ate the same foods is ill. He Reviewed normal colonoscopy report with patient from 2015.  I recommend continuing to take Imodium. She should avoid dairy or sugar-free foods or beverages for the next few days, BRAT diet given to patient. Recommend she stay hydrated and let  me know if the diarrhea is not improving or if she worsens or develops new symptoms such as fever, vomiting or abdominal pain. Will follow up pending labs. If diarrhea persists or worsens I plan to get stool culture and consider antibiotic therapy.

## 2016-04-30 ENCOUNTER — Encounter: Payer: Self-pay | Admitting: Family Medicine

## 2016-05-14 ENCOUNTER — Ambulatory Visit (INDEPENDENT_AMBULATORY_CARE_PROVIDER_SITE_OTHER): Payer: Medicare Other | Admitting: Family Medicine

## 2016-05-14 ENCOUNTER — Encounter: Payer: Self-pay | Admitting: Family Medicine

## 2016-05-14 VITALS — BP 112/60 | Ht 64.0 in | Wt 199.0 lb

## 2016-05-14 DIAGNOSIS — I10 Essential (primary) hypertension: Secondary | ICD-10-CM | POA: Diagnosis not present

## 2016-05-14 DIAGNOSIS — J302 Other seasonal allergic rhinitis: Secondary | ICD-10-CM | POA: Diagnosis not present

## 2016-05-14 DIAGNOSIS — E1159 Type 2 diabetes mellitus with other circulatory complications: Secondary | ICD-10-CM

## 2016-05-14 DIAGNOSIS — H40039 Anatomical narrow angle, unspecified eye: Secondary | ICD-10-CM

## 2016-05-14 DIAGNOSIS — E118 Type 2 diabetes mellitus with unspecified complications: Secondary | ICD-10-CM | POA: Diagnosis not present

## 2016-05-14 DIAGNOSIS — I152 Hypertension secondary to endocrine disorders: Secondary | ICD-10-CM

## 2016-05-14 DIAGNOSIS — E669 Obesity, unspecified: Secondary | ICD-10-CM | POA: Diagnosis not present

## 2016-05-14 DIAGNOSIS — E785 Hyperlipidemia, unspecified: Secondary | ICD-10-CM

## 2016-05-14 DIAGNOSIS — E1169 Type 2 diabetes mellitus with other specified complication: Secondary | ICD-10-CM

## 2016-05-14 LAB — POCT GLYCOSYLATED HEMOGLOBIN (HGB A1C): HEMOGLOBIN A1C: 6

## 2016-05-14 NOTE — Progress Notes (Signed)
Subjective:   HPI  [name] is a 65 y.o. female who presents for a complete physical.  Medical care team includes:  Dr.Mathews   Preventative care: Last ophthalmology visit:11/22/15 Last dental visit:4/17 Last colonoscopy:031615 Last mammogram:01/20/14 Last gynecological exam: Last EKG:10/16/12 Last labs:12/2015  Prior vaccinations: TD or Tdap:01/23/2007 Influenza:09/13/15 Pneumococcal:23: 02/28/12 13;:09/07/2014 Shingles/Zostavax: Other: 01/23/2007  Advanced directive:done Concerns:She does have underlying diabetes. She does check her blood sugars regularly. Her exercise pattern is quite minimal. She does not drink or smoke. She does check her feet regularly and have eye exams done. She continues on her Lipitor as well as lisinopril and Actos. She is having no difficulty with any these medications. He sees her ophthalmologist regularly for the glaucoma. Her allergies are under good control. She is now retired and at this point seems to be enjoying this. Her marriage is going well.   Reviewed their medical, surgical, family, social, medication, and allergy history and updated chart as appropriate.    Review of Systems Constitutional: -fever, -chills, -sweats, -unexpected weight change, -decreased appetite, -fatigue Allergy: -sneezing, -itching, -congestion Dermatology: -changing moles, --rash, -lumps ENT: -runny nose, -ear pain, -sore throat, -hoarseness, -sinus pain, -teeth pain, - ringing in ears, -hearing loss, -nosebleeds Cardiology: -chest pain, -palpitations, -swelling, -difficulty breathing when lying flat, -waking up short of breath Respiratory: -cough, -shortness of breath, -difficulty breathing with exercise or exertion, -wheezing, -coughing up blood Gastroenterology: -abdominal pain, -nausea, -vomiting, -diarrhea, -constipation, -blood in stool, -changes in bowel movement, -difficulty swallowing or eating Hematology: -bleeding, -bruising  Musculoskeletal: -joint  aches, -muscle aches, -joint swelling, -back pain, -neck pain, -cramping, -changes in gait Ophthalmology: denies vision changes, eye redness, itching, discharge Urology: -burning with urination, -difficulty urinating, -blood in urine, -urinary frequency, -urgency, -incontinence Neurology: -headache, -weakness, -tingling, -numbness, -memory loss, -falls, -dizziness Psychology: -depressed mood, -agitation, -sleep problems     Objective:   Physical Exam  Vitals:   05/14/16 0903  BP: 112/60    General appearance: alert, no distress, WD/WN,  Skin:  HEENT: normocephalic, conjunctiva/corneas normal, sclerae anicteric, PERRLA, EOMi, nares patent, no discharge or erythema, pharynx normal Oral cavity: MMM, tongue normal, teeth normal Neck: supple, no lymphadenopathy, no thyromegaly, no masses, normal ROM Chest: non tender, normal shape and expansion Heart: RRR, normal S1, S2, no murmurs Lungs: CTA bilaterally, no wheezes, rhonchi, or rales Abdomen: +bs, soft, non tender, non distended, no masses, no hepatomegaly, no splenomegaly, no bruits Musculoskeletal: upper extremities non tender, no obvious deformity, normal ROM throughout, lower extremities non tender, no obvious deformity, normal ROM throughout Extremities: no edema, no cyanosis, no clubbing Pulses: 2+ symmetric, upper and lower extremities, normal cap refill Neurological: alert, oriented x 3, CN2-12 intact, strength normal upper extremities and lower extremities, sensation normal throughout, DTRs 2+ throughout, no cerebellar signs, gait normal Psychiatric: normal affect, behavior normal, pleasant      Assessment and Plan :   Hypertension associated with diabetes (HCC)  Allergic rhinitis, seasonal  Controlled type 2 diabetes mellitus with complication, without long-term current use of insulin ([place]) - Plan: POCT glycosylated hemoglobin (Hb A1C)  Hyperlipidemia associated with type 2 diabetes mellitus (HCC)  Obesity (BMI  30-39.9)  Narrow angle glaucoma suspect, unspecified laterality    Physical exam - discussed healthy lifestyle, diet, exercise, preventative care, vaccinations, and addressed their concerns.  Handout given.  Follow-up 4 months

## 2016-07-26 ENCOUNTER — Encounter: Payer: Self-pay | Admitting: Family Medicine

## 2016-08-07 ENCOUNTER — Telehealth: Payer: Self-pay | Admitting: Family Medicine

## 2016-08-07 ENCOUNTER — Other Ambulatory Visit: Payer: Self-pay

## 2016-08-07 MED ORDER — ATORVASTATIN CALCIUM 10 MG PO TABS: 10.0000 mg | ORAL_TABLET | Freq: Every day | ORAL | 3 refills | Status: DC

## 2016-08-07 MED ORDER — PIOGLITAZONE HCL 30 MG PO TABS: 30.0000 mg | ORAL_TABLET | Freq: Every day | ORAL | 1 refills | Status: DC

## 2016-08-07 MED ORDER — LISINOPRIL-HYDROCHLOROTHIAZIDE 20-12.5 MG PO TABS: 1.0000 | ORAL_TABLET | Freq: Every day | ORAL | 3 refills | Status: DC

## 2016-08-07 NOTE — Telephone Encounter (Signed)
I have sent meds to new pharmacy

## 2016-08-07 NOTE — Telephone Encounter (Signed)
ALERT PT HAS NEW PHARMACY DUE TO INSURANCE CHANGE. Pt recently went on medicare and all rx's need to be sent to [facility] on [address]. She is almost out of Lisinopril and also wants Lipitor and Actos changed to that pharmacy too.

## 2016-08-10 DIAGNOSIS — Z1231 Encounter for screening mammogram for malignant neoplasm of breast: Secondary | ICD-10-CM | POA: Diagnosis not present

## 2016-08-10 LAB — HM MAMMOGRAPHY

## 2016-08-12 ENCOUNTER — Encounter: Payer: Self-pay | Admitting: Family Medicine

## 2016-08-13 ENCOUNTER — Encounter: Payer: Self-pay | Admitting: Family Medicine

## 2016-08-28 ENCOUNTER — Encounter: Payer: Self-pay | Admitting: Family Medicine

## 2016-08-28 ENCOUNTER — Ambulatory Visit (INDEPENDENT_AMBULATORY_CARE_PROVIDER_SITE_OTHER): Payer: Medicare Other | Admitting: Family Medicine

## 2016-08-28 VITALS — BP 120/72 | HR 58 | Ht 64.0 in | Wt 211.0 lb

## 2016-08-28 DIAGNOSIS — I152 Hypertension secondary to endocrine disorders: Secondary | ICD-10-CM

## 2016-08-28 DIAGNOSIS — L309 Dermatitis, unspecified: Secondary | ICD-10-CM | POA: Diagnosis not present

## 2016-08-28 DIAGNOSIS — E785 Hyperlipidemia, unspecified: Secondary | ICD-10-CM

## 2016-08-28 DIAGNOSIS — E1169 Type 2 diabetes mellitus with other specified complication: Secondary | ICD-10-CM

## 2016-08-28 DIAGNOSIS — I10 Essential (primary) hypertension: Secondary | ICD-10-CM | POA: Diagnosis not present

## 2016-08-28 DIAGNOSIS — Z23 Encounter for immunization: Secondary | ICD-10-CM | POA: Diagnosis not present

## 2016-08-28 DIAGNOSIS — E1159 Type 2 diabetes mellitus with other circulatory complications: Secondary | ICD-10-CM | POA: Diagnosis not present

## 2016-08-28 DIAGNOSIS — R202 Paresthesia of skin: Secondary | ICD-10-CM | POA: Diagnosis not present

## 2016-08-28 DIAGNOSIS — E118 Type 2 diabetes mellitus with unspecified complications: Secondary | ICD-10-CM

## 2016-08-28 DIAGNOSIS — E669 Obesity, unspecified: Secondary | ICD-10-CM | POA: Diagnosis not present

## 2016-08-28 LAB — POCT GLYCOSYLATED HEMOGLOBIN (HGB A1C): HEMOGLOBIN A1C: 6.1

## 2016-08-28 NOTE — Patient Instructions (Addendum)
When youhave the tingling pay attention to the position of your elbow and wrist and also which fingers if any does the tingling go in to Use an antifungal on that area for at least 2 weeks if he started having symptoms again. When you are about to run out of the Actos call me

## 2016-08-28 NOTE — Progress Notes (Signed)
Subjective:    Patient ID: [name], female    DOB: 05/26/1951, 65 y.o.   MRN: [id number]  [name] is a 65 y.o. female who presents for follow-up of Type 2 diabetes mellitus.  Patient is checking home blood sugars.   Home blood sugar records: 110 How often is blood sugars being checked: QD Current symptoms/problem none Daily foot checks: yes   Any foot concerns: none Last eye exam: 10/23/15 Exercise: not walking but does plan to start walking again. She continues on Lipitor and lisinopril/HCTZ without difficulty. She is also on Actos. She has apparently been on Actos since diagnosed with diabetes several years ago. She is also on multiple supplements. She does complain of bilateral drainage from both ears she states is sometimes yellow but she is having no pain or decreased hearing. She also notes difficulty with discomfort in the inguinal creases bilaterally especially during the summer. Occasionally does smell bad but she uses steroid creams on that. She does complain of right arm tingling when she is using computer but cannot state whether it goes down into her fingers or not. It does tend to go away fairly quickly.  The following portions of the patient's history were reviewed and updated as appropriate: allergies, current medications, past medical history, past social history and problem list.  ROS as in subjective above.     Objective:    Physical Exam Alert and in no distress Normal motor, sensory and DTRs of the arms. Negative Tinel's sign. Exam of the inguinal area does show some slight pinkish discoloration but no drainage, not malodorous. Exam of the ears shows the canals to be normal. In no lesions noted on the external canal. TMs normal.  Lab Review Diabetic Labs Latest Ref Rng & Units 05/14/2016 04/26/2016 01/11/2016 09/13/2015 05/10/2015  HbA1c - 6.0 - 6.5 6.4 -  Microalbumin mg/L - - <4.4 - 0.3  Micro/Creat Ratio - - - 5.0 - 6.6  Chol 125 - 200 mg/dL - - 130 - -    HDL >=46 mg/dL - - 51 - -  Calc LDL <130 mg/dL - - 67 - -  Triglycerides <150 mg/dL - - 62 - -  Creatinine 0.50 - 0.99 mg/dL - 0.76 0.59 - -   BP/Weight 05/14/2016 04/26/2016 04/03/2016 01/11/2016 [id number]  Systolic BP [id number] 123456 123456 [id number]  Diastolic BP 60 70 70 82 70  Wt. (Lbs) 199 199.4 204 205.8 204.4  BMI 34.16 34.21 35 35.31 35.07   Foot/eye exam completion dates Latest Ref Rng & Units 01/11/2016 11/22/2015  Eye Exam No Retinopathy - No Retinopathy  Foot Form Completion - Done -   A1c is 6.1 [name]  reports that she has never smoked. She has never used smokeless tobacco. She reports that she does not drink alcohol or use drugs.     Assessment & Plan:    Type 2 diabetes mellitus with complication, without long-term current use of insulin (HCC) - Plan: HgB A1c  Need for prophylactic vaccination and inoculation against influenza - Plan: Flu vaccine HIGH DOSE PF (Fluzone High dose)  Hypertension associated with diabetes ([place])  Hyperlipidemia associated with type 2 diabetes mellitus ([place])  Obesity (BMI 30-39.9)  Dermatitis  Tingling sensation    1. Rx changes: She will call me when she is about to run out of Actos. I explained that I will probably want to switch her to metformin due to some the risks with being on Actos long-term. 2. Education: Reviewed 'ABCs' of  diabetes management (respective goals in parentheses):  A1C (<7), blood pressure (<130/80), and cholesterol (LDL <100). 3. Compliance at present is estimated to be good. Efforts to improve compliance (if necessary) will be directed at increased exercise. 4. Follow up: 4 months  When youhave the tingling pay attention to the position of your elbow and wrist and also which fingers if any does the tingling go in to Use an antifungal on that area for at least 2 weeks if he started having symptoms again. When you are about to run out of the Actos call me. over 45 minutes spent

## 2016-09-04 ENCOUNTER — Encounter: Payer: Self-pay | Admitting: Family Medicine

## 2016-10-24 ENCOUNTER — Encounter: Payer: Self-pay | Admitting: Family Medicine

## 2016-10-24 ENCOUNTER — Ambulatory Visit (INDEPENDENT_AMBULATORY_CARE_PROVIDER_SITE_OTHER): Payer: Medicare Other | Admitting: Family Medicine

## 2016-10-24 VITALS — BP 120/80 | HR 76 | Temp 98.2°F | Resp 16 | Wt 214.0 lb

## 2016-10-24 DIAGNOSIS — R059 Cough, unspecified: Secondary | ICD-10-CM

## 2016-10-24 DIAGNOSIS — R05 Cough: Secondary | ICD-10-CM

## 2016-10-24 DIAGNOSIS — J069 Acute upper respiratory infection, unspecified: Secondary | ICD-10-CM | POA: Diagnosis not present

## 2016-10-24 MED ORDER — BENZONATATE 200 MG PO CAPS: 200.0000 mg | ORAL_CAPSULE | Freq: Two times a day (BID) | ORAL | 0 refills | Status: DC | PRN

## 2016-10-24 NOTE — Patient Instructions (Signed)
I suspect your symptoms are related to a viral illness and recommend treating your symptoms at this point.  Mucinex DM for congestion and cough, drink extra water, use salt water gargles for throat irritation and Tylenol or Ibuprofen for aches and pains.  Call if you are not improving by days 7-10 of your illness or if you develop fever, wheezing or worsening symptoms.    Upper Respiratory Infection, Adult Most upper respiratory infections (URIs) are a viral infection of the air passages leading to the lungs. A URI affects the nose, throat, and upper air passages. The most common type of URI is nasopharyngitis and is typically referred to as "the common cold." URIs run their course and usually go away on their own. Most of the time, a URI does not require medical attention, but sometimes a bacterial infection in the upper airways can follow a viral infection. This is called a secondary infection. Sinus and middle ear infections are common types of secondary upper respiratory infections. Bacterial pneumonia can also complicate a URI. A URI can worsen asthma and chronic obstructive pulmonary disease (COPD). Sometimes, these complications can require emergency medical care and may be life threatening. What are the causes? Almost all URIs are caused by viruses. A virus is a type of germ and can spread from one person to another. What increases the risk? You may be at risk for a URI if:  You smoke.  You have chronic heart or lung disease.  You have a weakened defense (immune) system.  You are very young or very old.  You have nasal allergies or asthma.  You work in crowded or poorly ventilated areas.  You work in health care facilities or schools. What are the signs or symptoms? Symptoms typically develop 2-3 days after you come in contact with a cold virus. Most viral URIs last 7-10 days. However, viral URIs from the influenza virus (flu virus) can last 14-18 days and are typically more  severe. Symptoms may include:  Runny or stuffy (congested) nose.  Sneezing.  Cough.  Sore throat.  Headache.  Fatigue.  Fever.  Loss of appetite.  Pain in your forehead, behind your eyes, and over your cheekbones (sinus pain).  Muscle aches. How is this diagnosed? Your health care provider may diagnose a URI by:  Physical exam.  Tests to check that your symptoms are not due to another condition such as:  Strep throat.  Sinusitis.  Pneumonia.  Asthma. How is this treated? A URI goes away on its own with time. It cannot be cured with medicines, but medicines may be prescribed or recommended to relieve symptoms. Medicines may help:  Reduce your fever.  Reduce your cough.  Relieve nasal congestion. Follow these instructions at home:  Take medicines only as directed by your health care provider.  Gargle warm saltwater or take cough drops to comfort your throat as directed by your health care provider.  Use a warm mist humidifier or inhale steam from a shower to increase air moisture. This may make it easier to breathe.  Drink enough fluid to keep your urine clear or pale yellow.  Eat soups and other clear broths and maintain good nutrition.  Rest as needed.  Return to work when your temperature has returned to normal or as your health care provider advises. You may need to stay home longer to avoid infecting others. You can also use a face mask and careful hand washing to prevent spread of the virus.  Increase the usage  of your inhaler if you have asthma.  Do not use any tobacco products, including cigarettes, chewing tobacco, or electronic cigarettes. If you need help quitting, ask your health care provider. How is this prevented? The best way to protect yourself from getting a cold is to practice good hygiene.  Avoid oral or hand contact with people with cold symptoms.  Wash your hands often if contact occurs. There is no clear evidence that vitamin C,  vitamin E, echinacea, or exercise reduces the chance of developing a cold. However, it is always recommended to get plenty of rest, exercise, and practice good nutrition. Contact a health care provider if:  You are getting worse rather than better.  Your symptoms are not controlled by medicine.  You have chills.  You have worsening shortness of breath.  You have brown or red mucus.  You have yellow or brown nasal discharge.  You have pain in your face, especially when you bend forward.  You have a fever.  You have swollen neck glands.  You have pain while swallowing.  You have white areas in the back of your throat. Get help right away if:  You have severe or persistent:  Headache.  Ear pain.  Sinus pain.  Chest pain.  You have chronic lung disease and any of the following:  Wheezing.  Prolonged cough.  Coughing up blood.  A change in your usual mucus.  You have a stiff neck.  You have changes in your:  Vision.  Hearing.  Thinking.  Mood. This information is not intended to replace advice given to you by your health care provider. Make sure you discuss any questions you have with your health care provider. Document Released: 04/03/2001 Document Revised: 06/10/2016 Document Reviewed: 01/13/2014 Elsevier Interactive Patient Education  2017 [employer].

## 2016-10-24 NOTE — Progress Notes (Signed)
Subjective: Chief Complaint  Patient presents with  . cold-    cold for 2 days- congestion, coughing, at night having some wheezing.    [name] is a 66 y.o. female who presents for an acute illness.  Symptoms include a 2 day history of cough that is somewhat productive of clear sputum. Also reports a 2 day history of rhinorrhea, and nasal congestion.   Denies fever, chills, ear pain, sore throat, chest pain, shortness of breath, abdominal pain, N/V/D. No LE edema.   Treatment to date: Mucinex DM.  ? sick contacts.   She does not smoke.   She reports having a history of bronchitis in the distant past. No pneumonia or asthma history.    No other aggravating or relieving factors.  No other c/o.  The following portions of the patient's history were reviewed and updated as appropriate: allergies, current medications, past family history, past medical history, past social history, past surgical history and problem list.  ROS as in subjective  Past Medical History:  Diagnosis Date  . Basal cell carcinoma of nose [facility]/13   [facility]   . Diabetes mellitus   . Dyslipidemia   . Hyperlipidemia   . Hypertension   . Obesity   . Obesity      Objective: Vital signs reviewed BP 120/80   Pulse 76   Temp 98.2 F (36.8 C) (Oral)   Resp 16   Wt 214 lb (97.1 kg)   SpO2 97%   BMI 36.73 kg/m    General appearance: Alert, WD/WN, no distress, ill appearing                             Skin: warm, no rash, no diaphoresis                           Head: no sinus tenderness                            Eyes: conjunctiva normal, corneas clear, PERRLA                            Ears: pearly TMs, external ear canals normal                          Nose: septum midline, turbinates swollen, with erythema and clear discharge             Mouth/throat: MMM, tongue normal, mild pharyngeal erythema                           Neck: supple, no adenopathy, no thyromegaly, nontender               Heart: RRR, normal S1, S2, no murmurs                         Lungs: Clear throughout, no wheezes, no rales                Extremities: no edema, nontender      Assessment: Acute URI  Cough   Plan:  Medication orders today include: Tessalon prescribed.   Discussed diagnosis and treatment of an acute URI that is most likely related to a viral  illness.  Suggested symptomatic OTC remedies for cough and congestion.  Tylenol or Ibuprofen OTC for fever and malaise.  Call/return in 2-3 days if symptoms are worse or not improving.  Advised that cough may linger even after the infection is improved.

## 2016-11-19 DIAGNOSIS — L814 Other melanin hyperpigmentation: Secondary | ICD-10-CM | POA: Diagnosis not present

## 2016-11-19 DIAGNOSIS — D1801 Hemangioma of skin and subcutaneous tissue: Secondary | ICD-10-CM | POA: Diagnosis not present

## 2016-11-19 DIAGNOSIS — L821 Other seborrheic keratosis: Secondary | ICD-10-CM | POA: Diagnosis not present

## 2016-11-19 DIAGNOSIS — L219 Seborrheic dermatitis, unspecified: Secondary | ICD-10-CM | POA: Diagnosis not present

## 2016-11-19 DIAGNOSIS — D235 Other benign neoplasm of skin of trunk: Secondary | ICD-10-CM | POA: Diagnosis not present

## 2016-11-22 DIAGNOSIS — E119 Type 2 diabetes mellitus without complications: Secondary | ICD-10-CM | POA: Diagnosis not present

## 2016-12-10 ENCOUNTER — Ambulatory Visit (INDEPENDENT_AMBULATORY_CARE_PROVIDER_SITE_OTHER): Payer: Medicare Other | Admitting: Ophthalmology

## 2016-12-10 DIAGNOSIS — H353111 Nonexudative age-related macular degeneration, right eye, early dry stage: Secondary | ICD-10-CM

## 2016-12-10 DIAGNOSIS — I10 Essential (primary) hypertension: Secondary | ICD-10-CM

## 2016-12-10 DIAGNOSIS — H2513 Age-related nuclear cataract, bilateral: Secondary | ICD-10-CM | POA: Diagnosis not present

## 2016-12-10 DIAGNOSIS — H43813 Vitreous degeneration, bilateral: Secondary | ICD-10-CM

## 2016-12-10 DIAGNOSIS — H35033 Hypertensive retinopathy, bilateral: Secondary | ICD-10-CM | POA: Diagnosis not present

## 2016-12-10 DIAGNOSIS — H35372 Puckering of macula, left eye: Secondary | ICD-10-CM | POA: Diagnosis not present

## 2016-12-10 LAB — HM DIABETES EYE EXAM

## 2016-12-26 ENCOUNTER — Encounter: Payer: Self-pay | Admitting: Family Medicine

## 2016-12-26 ENCOUNTER — Ambulatory Visit (INDEPENDENT_AMBULATORY_CARE_PROVIDER_SITE_OTHER): Payer: Medicare Other | Admitting: Family Medicine

## 2016-12-26 VITALS — BP 126/70 | HR 70 | Ht 64.0 in | Wt 216.0 lb

## 2016-12-26 DIAGNOSIS — I10 Essential (primary) hypertension: Secondary | ICD-10-CM

## 2016-12-26 DIAGNOSIS — E669 Obesity, unspecified: Secondary | ICD-10-CM | POA: Diagnosis not present

## 2016-12-26 DIAGNOSIS — E1159 Type 2 diabetes mellitus with other circulatory complications: Secondary | ICD-10-CM | POA: Diagnosis not present

## 2016-12-26 DIAGNOSIS — H40039 Anatomical narrow angle, unspecified eye: Secondary | ICD-10-CM

## 2016-12-26 DIAGNOSIS — E1169 Type 2 diabetes mellitus with other specified complication: Secondary | ICD-10-CM | POA: Diagnosis not present

## 2016-12-26 DIAGNOSIS — E118 Type 2 diabetes mellitus with unspecified complications: Secondary | ICD-10-CM | POA: Diagnosis not present

## 2016-12-26 DIAGNOSIS — E785 Hyperlipidemia, unspecified: Secondary | ICD-10-CM

## 2016-12-26 LAB — POCT UA - MICROALBUMIN: CREATININE, POC: 67.2 mg/dL

## 2016-12-26 LAB — POCT GLYCOSYLATED HEMOGLOBIN (HGB A1C): HEMOGLOBIN A1C: 6.2

## 2016-12-26 MED ORDER — METFORMIN HCL 500 MG PO TABS: 500.0000 mg | ORAL_TABLET | Freq: Two times a day (BID) | ORAL | 1 refills | Status: DC

## 2016-12-26 NOTE — Progress Notes (Signed)
  Subjective:    Patient ID: [name], female    DOB: [birth year], 66 y.o.   MRN: [id number]  [name] is a 66 y.o. female who presents for follow-up of Type 2 diabetes mellitus.  Patient is checking home blood sugars.   Home blood sugar records: 121 to 145 How often is blood sugars being checked: once a day Current symptoms/problem none Daily foot checks: yes   Any foot concerns: none Last eye exam: 11/22/16 and 12/10/16 Exercise some but not much She does take a multivitamin regularly. She also is taking Prinzide and having no difficulty. Presently she is taking Actos and has never been on metformin. She continues on Lipitor without difficulty. She does have underlying glaucoma The following portions of the patient's history were reviewed and updated as appropriate: allergies, current medications, past medical history, past social history and problem list.  ROS as in subjective above.     Objective:    Physical Exam Alert and in no distress otherwise not examined.  Blood pressure 126/70, pulse 70, height 5\' 4"  (1.626 m), weight 216 lb (98 kg).  Lab Review Diabetic Labs Latest Ref Rng & Units 08/28/2016 05/14/2016 04/26/2016 01/11/2016 09/13/2015  HbA1c - 6.1 6.0 - 6.5 6.4  Microalbumin mg/L - - - <4.4 -  Micro/Creat Ratio - - - - 5.0 -  Chol 125 - 200 mg/dL - - - 130 -  HDL >=46 mg/dL - - - 51 -  Calc LDL <130 mg/dL - - - 67 -  Triglycerides <150 mg/dL - - - 62 -  Creatinine 0.50 - 0.99 mg/dL - - 0.76 0.59 -   BP/Weight 12/26/2016 10/24/2016 08/28/2016 2/56/3893 04/23/4286  Systolic BP 681 157 262 035 597  Diastolic BP 70 80 72 60 70  Wt. (Lbs) 216 214 211 199 199.4  BMI 37.08 36.73 36.22 34.16 34.21   Foot/eye exam completion dates Latest Ref Rng & Units 01/11/2016 11/22/2015  Eye Exam No Retinopathy - No Retinopathy  Foot Form Completion - Done -  1C is 6.2  [name]  reports that she has never smoked. She has never used smokeless tobacco. She reports that she does not  drink alcohol or use drugs.     Assessment & Plan:    Type 2 diabetes mellitus with complication, without long-term current use of insulin (HCC)  Hypertension associated with diabetes ([place])  Hyperlipidemia associated with type 2 diabetes mellitus ([place])  Obesity (BMI 30-39.9)  Borderline glaucoma with anatomical narrow angle, unspecified laterality   1. Rx changes: Stop Actos and start taking metformin. discussed possible side effects from the metformin. She will let me know she has any problems. Education: Reviewed 'ABCs' of diabetes management (respective goals in parentheses):  A1C (<7), blood pressure (<130/80), and cholesterol (LDL <100). 2. Compliance at present is estimated to be good. Efforts to improve compliance (if necessary) will be directed at increased exercise. 3. Follow up: 4 months

## 2017-01-08 DIAGNOSIS — H2513 Age-related nuclear cataract, bilateral: Secondary | ICD-10-CM | POA: Diagnosis not present

## 2017-01-08 DIAGNOSIS — H40033 Anatomical narrow angle, bilateral: Secondary | ICD-10-CM | POA: Diagnosis not present

## 2017-01-08 DIAGNOSIS — H40013 Open angle with borderline findings, low risk, bilateral: Secondary | ICD-10-CM | POA: Diagnosis not present

## 2017-01-25 ENCOUNTER — Other Ambulatory Visit: Payer: Self-pay

## 2017-01-25 ENCOUNTER — Telehealth: Payer: Self-pay | Admitting: Family Medicine

## 2017-01-25 MED ORDER — GLUCOSE BLOOD VI STRP: ORAL_STRIP | 3 refills | Status: DC

## 2017-01-25 MED ORDER — ONETOUCH LANCETS MISC: 3 refills | Status: DC

## 2017-01-25 NOTE — Telephone Encounter (Signed)
Sent in

## 2017-01-25 NOTE — Telephone Encounter (Signed)
PT HAS A NEW PHARMACY Pt called and stated that she received the wrong lancets from mail order. Pt states they do not fit her machine. She needs one touch ultra soft lancets and one touch ultra blue strips sent to NEW PHARMACY. Please send to [facility]. Pt can be reached at [phone].

## 2017-03-04 ENCOUNTER — Ambulatory Visit (INDEPENDENT_AMBULATORY_CARE_PROVIDER_SITE_OTHER): Payer: Medicare Other | Admitting: Family Medicine

## 2017-03-04 ENCOUNTER — Encounter: Payer: Self-pay | Admitting: Family Medicine

## 2017-03-04 ENCOUNTER — Telehealth: Payer: Self-pay | Admitting: Internal Medicine

## 2017-03-04 VITALS — BP 122/80 | HR 72 | Temp 98.1°F | Wt 220.0 lb

## 2017-03-04 DIAGNOSIS — H9213 Otorrhea, bilateral: Secondary | ICD-10-CM | POA: Diagnosis not present

## 2017-03-04 DIAGNOSIS — H938X3 Other specified disorders of ear, bilateral: Secondary | ICD-10-CM

## 2017-03-04 DIAGNOSIS — L853 Xerosis cutis: Secondary | ICD-10-CM | POA: Diagnosis not present

## 2017-03-04 DIAGNOSIS — E118 Type 2 diabetes mellitus with unspecified complications: Secondary | ICD-10-CM

## 2017-03-04 DIAGNOSIS — H6123 Impacted cerumen, bilateral: Secondary | ICD-10-CM

## 2017-03-04 MED ORDER — PIOGLITAZONE HCL 30 MG PO TABS: 30.0000 mg | ORAL_TABLET | Freq: Every day | ORAL | 1 refills | Status: DC

## 2017-03-04 MED ORDER — NEOMYCIN-POLYMYXIN-HC 3.5-10000-1 OT SOLN: 4.0000 [drp] | Freq: Four times a day (QID) | OTIC | 0 refills | Status: DC

## 2017-03-04 NOTE — Patient Instructions (Signed)
Use the Cortisporin Otic drops for the next 7-10 days and let us know if this is not improving or if symptoms get worse.   Per Dr. Redmond School, continue taking your Metformin and start back on the Actos for your diabetes. Follow up with him for this.

## 2017-03-04 NOTE — Telephone Encounter (Signed)
Left VM word for word

## 2017-03-04 NOTE — Progress Notes (Signed)
Subjective: Chief Complaint  Patient presents with  . sick    sick- both ears- fluid build up. clear to creamy yellow fluid, sticky. been going on for a couple months.      [name] is a 66 y.o. female who presents for a 4 week history of intermittent ear drainage that has been clear bilaterally up until 2 days ago and now drainage is still clear on the right side and thick yellowish on left side. No pain. States she is having trouble hearing. Left side feels more clogged. States has a history of dry skin to her ears.  Denies fever, chills, rhinorrhea, nasal congestion, sore throat, chest pain, palpitations, abdominal pain, N/V/D.   Treatment to date: putting vaseline or polysporin in her ears. has also used hydrocortisone .  Denies sick contacts.  No other aggravating or relieving factors.    States her fasting blood sugar average is 142 in April and so far this month average BS is 166. She would like for Dr. Redmond School to be notified.  No increased thirst or urination.   ROS as in subjective.   Objective: Vitals:   03/04/17 1456  BP: 122/80  Pulse: 72  Temp: 98.1 F (36.7 C)    General appearance: Alert, WD/WN, no distress, mildly ill appearing                             Skin: warm, no rash                           Head: no sinus tenderness                            Eyes: conjunctiva normal, corneas clear, PERRLA                            Ears: cerumen impaction bilaterally, right external ear canal dry, left external canal dry with erythema and cracking, mild edema. No discharge.                           Nose: septum midline, turbinates swollen, with erythema and clear discharge             Mouth/throat: MMM, tongue normal, mild pharyngeal erythema                           Neck: supple, no adenopathy, no thyromegaly, nontender                                Assessment: Ear fullness, bilateral  Ear drainage, bilateral  Dry skin dermatitis  Controlled type 2  diabetes mellitus with complication, without long-term current use of insulin (HCC)  Bilateral impacted cerumen    Plan: Discussed diagnosis and treatment of dermatitis to bilateral external ears. No acute otitis media.  Will have her use cortisporin otic for the next week and follow up if symptoms worsen or if she is not back to baseline. Bilateral ear lavage performed by Gabriel Cirri, CMA for cerumen impaction. She tolerated this well and reports hearing has improved.  She discussed that her blood sugars are elevated since she stopped Actos. Dr. Redmond School recommends she start back on Actos  and continue with Metformin. Prescription refilled and she will follow up with Dr. Redmond School for diabetes.

## 2017-03-04 NOTE — Telephone Encounter (Signed)
Pt came in for an acute visit with [name] today and mentioned that her Blood sugars are reading higher now being on metformin than when she was on actos. April average was 142 and may so far is 166 with the lowest being 145 this morning.  She is not due back until July, what do you recommend. She is taking her metformin 1 tablet in am and 1 tablet in evening.

## 2017-03-04 NOTE — Telephone Encounter (Signed)
Let's watch this for another month and have her let us know what the blood sugar readings are

## 2017-03-04 NOTE — Telephone Encounter (Signed)
Pt was notified that she will start back on actos 30mg  once daily while still taking metformin and return in July for her appt. I have sent Actos (per JCL) to pharmacy per patient

## 2017-05-01 ENCOUNTER — Ambulatory Visit (INDEPENDENT_AMBULATORY_CARE_PROVIDER_SITE_OTHER): Payer: Medicare Other | Admitting: Family Medicine

## 2017-05-01 ENCOUNTER — Encounter: Payer: Self-pay | Admitting: Family Medicine

## 2017-05-01 VITALS — BP 116/70 | HR 76 | Ht 64.0 in | Wt 216.0 lb

## 2017-05-01 DIAGNOSIS — E785 Hyperlipidemia, unspecified: Secondary | ICD-10-CM | POA: Diagnosis not present

## 2017-05-01 DIAGNOSIS — H6093 Unspecified otitis externa, bilateral: Secondary | ICD-10-CM

## 2017-05-01 DIAGNOSIS — E1159 Type 2 diabetes mellitus with other circulatory complications: Secondary | ICD-10-CM

## 2017-05-01 DIAGNOSIS — I10 Essential (primary) hypertension: Secondary | ICD-10-CM | POA: Diagnosis not present

## 2017-05-01 DIAGNOSIS — Z23 Encounter for immunization: Secondary | ICD-10-CM | POA: Diagnosis not present

## 2017-05-01 DIAGNOSIS — E1169 Type 2 diabetes mellitus with other specified complication: Secondary | ICD-10-CM | POA: Diagnosis not present

## 2017-05-01 DIAGNOSIS — E669 Obesity, unspecified: Secondary | ICD-10-CM

## 2017-05-01 DIAGNOSIS — E118 Type 2 diabetes mellitus with unspecified complications: Secondary | ICD-10-CM

## 2017-05-01 LAB — CBC WITH DIFFERENTIAL/PLATELET
BASOS ABS: 41 {cells}/uL (ref 0–200)
Basophils Relative: 1 %
EOS ABS: 82 {cells}/uL (ref 15–500)
Eosinophils Relative: 2 %
HEMATOCRIT: 35.7 % (ref 35.0–45.0)
Hemoglobin: 11.4 g/dL — ABNORMAL LOW (ref 11.7–15.5)
LYMPHS PCT: 24 %
Lymphs Abs: 984 cells/uL (ref 850–3900)
MCH: 29.6 pg (ref 27.0–33.0)
MCHC: 31.9 g/dL — AB (ref 32.0–36.0)
MCV: 92.7 fL (ref 80.0–100.0)
MONO ABS: 287 {cells}/uL (ref 200–950)
MPV: 9.7 fL (ref 7.5–12.5)
Monocytes Relative: 7 %
NEUTROS PCT: 66 %
Neutro Abs: 2706 cells/uL (ref 1500–7800)
Platelets: 239 10*3/uL (ref 140–400)
RBC: 3.85 MIL/uL (ref 3.80–5.10)
RDW: 14.6 % (ref 11.0–15.0)
WBC: 4.1 10*3/uL (ref 4.0–10.5)

## 2017-05-01 LAB — COMPREHENSIVE METABOLIC PANEL
ALBUMIN: 4.3 g/dL (ref 3.6–5.1)
ALT: 20 U/L (ref 6–29)
AST: 21 U/L (ref 10–35)
Alkaline Phosphatase: 63 U/L (ref 33–130)
BUN: 23 mg/dL (ref 7–25)
CALCIUM: 9.5 mg/dL (ref 8.6–10.4)
CO2: 28 mmol/L (ref 20–31)
Chloride: 101 mmol/L (ref 98–110)
Creat: 0.8 mg/dL (ref 0.50–0.99)
Glucose, Bld: 117 mg/dL — ABNORMAL HIGH (ref 65–99)
POTASSIUM: 4.2 mmol/L (ref 3.5–5.3)
Sodium: 140 mmol/L (ref 135–146)
Total Bilirubin: 0.5 mg/dL (ref 0.2–1.2)
Total Protein: 6.7 g/dL (ref 6.1–8.1)

## 2017-05-01 LAB — LIPID PANEL
CHOL/HDL RATIO: 2.6 ratio (ref <5.0)
Cholesterol: 133 mg/dL (ref <200)
HDL: 52 mg/dL (ref >50)
LDL CALC: 63 mg/dL (ref <100)
TRIGLYCERIDES: 89 mg/dL (ref <150)
VLDL: 18 mg/dL (ref <30)

## 2017-05-01 LAB — POCT GLYCOSYLATED HEMOGLOBIN (HGB A1C): Hemoglobin A1C: 6.4

## 2017-05-01 MED ORDER — NEOMYCIN-POLYMYXIN-HC 3.5-10000-1 OT SUSP: 3.0000 [drp] | Freq: Three times a day (TID) | OTIC | 0 refills | Status: DC

## 2017-05-01 NOTE — Progress Notes (Signed)
  Subjective:    Patient ID: [name], female    DOB: 11/14/1950, 65 y.o.   MRN: [id number]  [name] is a 66 y.o. female who presents for follow-up of Type 2 diabetes mellitus.  Patient is checking home blood sugars.   Home blood sugar records: 112 to 122 How often is blood sugars being checked: once a day Current symptoms/problem None Daily foot checks: yes   Any foot concerns: None  Last eye exam: 12/10/16 Exercise: walking arm exercises  She is still having difficulty with drainage and thinks that the medications has not helped.she continues on Lipitor and is having no difficulty with that. She is also taking lisinopril/HCTZ.she continues on metformin and Actos. She is also taking multivitamins to help with The following portions of the patient's history were reviewed and updated as appropriate: allergies, current medications, past medical history, past social history and problem list.  ROS as in subjective above.     Objective:    Physical Exam Alert and in no distress otherwise not examined. A1c is 6.4 Lab Review Diabetic Labs Latest Ref Rng & Units 05/01/2017 12/26/2016 08/28/2016 05/14/2016 04/26/2016  HbA1c - 6.4 6.2 6.1 6.0 -  Microalbumin mg/L - <5.0 - - -  Micro/Creat Ratio - - <7.4 - - -  Chol 125 - 200 mg/dL - - - - -  HDL >=46 mg/dL - - - - -  Calc LDL <130 mg/dL - - - - -  Triglycerides <150 mg/dL - - - - -  Creatinine 0.50 - 0.99 mg/dL - - - - 0.76   BP/Weight 05/01/2017 03/04/2017 12/26/2016 10/24/2016 40/06/8118  Systolic BP 147 829 562 130 865  Diastolic BP 70 80 70 80 72  Wt. (Lbs) 216 220 216 214 211  BMI 37.08 37.76 37.08 36.73 36.22   Foot/eye exam completion dates Latest Ref Rng & Units 05/01/2017 12/10/2016  Eye Exam No Retinopathy - No Retinopathy  Foot Form Completion - Done -    [name]  reports that she has never smoked. She has never used smokeless tobacco. She reports that she does not drink alcohol or use drugs.     Assessment & Plan:      Controlled type 2 diabetes mellitus with complication, without long-term current use of insulin ([place]) - Plan: HgB A1c, CBC with Differential/Platelet, Comprehensive metabolic panel, Lipid panel  Otitis externa of both ears, unspecified chronicity, unspecified type - Plan: neomycin-polymyxin-hydrocortisone (CORTISPORIN) 3.5-10000-1 OTIC suspension  Hypertension associated with diabetes ([place]) - Plan: CBC with Differential/Platelet, Comprehensive metabolic panel  Hyperlipidemia associated with type 2 diabetes mellitus ([place]) - Plan: Lipid panel  Obesity (BMI 30-39.9) - Plan: CBC with Differential/Platelet, Comprehensive metabolic panel, Lipid panel  1. Rx changes: none 2. Education: Reviewed 'ABCs' of diabetes management (respective goals in parentheses):  A1C (<7), blood pressure (<130/80), and cholesterol (LDL <100). 3. Compliance at present is estimated to be good. Efforts to improve compliance (if necessary) will be directed at increased exercise. 4. Follow up: 4 months again encouraged her to become much more physically active.

## 2017-06-18 ENCOUNTER — Other Ambulatory Visit: Payer: Self-pay | Admitting: Family Medicine

## 2017-06-18 DIAGNOSIS — E118 Type 2 diabetes mellitus with unspecified complications: Secondary | ICD-10-CM

## 2017-06-27 ENCOUNTER — Encounter: Payer: Self-pay | Admitting: Family Medicine

## 2017-07-01 ENCOUNTER — Encounter: Payer: Self-pay | Admitting: Family Medicine

## 2017-07-01 ENCOUNTER — Ambulatory Visit (INDEPENDENT_AMBULATORY_CARE_PROVIDER_SITE_OTHER): Payer: Medicare Other | Admitting: Family Medicine

## 2017-07-01 VITALS — BP 132/80 | HR 77 | Resp 18 | Wt 217.4 lb

## 2017-07-01 DIAGNOSIS — L0882 Omphalitis not of newborn: Secondary | ICD-10-CM

## 2017-07-01 DIAGNOSIS — Z23 Encounter for immunization: Secondary | ICD-10-CM

## 2017-07-01 DIAGNOSIS — H6093 Unspecified otitis externa, bilateral: Secondary | ICD-10-CM

## 2017-07-01 DIAGNOSIS — R21 Rash and other nonspecific skin eruption: Secondary | ICD-10-CM | POA: Diagnosis not present

## 2017-07-01 MED ORDER — MUPIROCIN 2 % EX OINT: 1.0000 "application " | TOPICAL_OINTMENT | Freq: Two times a day (BID) | CUTANEOUS | 0 refills | Status: DC

## 2017-07-01 MED ORDER — CIPROFLOXACIN HCL 500 MG PO TABS: 500.0000 mg | ORAL_TABLET | Freq: Two times a day (BID) | ORAL | 0 refills | Status: DC

## 2017-07-01 NOTE — Patient Instructions (Signed)
Use cornstarch and cortisone for the arms Also use Bactroban

## 2017-07-01 NOTE — Progress Notes (Signed)
   Subjective:    Patient ID: [name], female    DOB: [birth year], 66 y.o.   MRN: [id number]  HPI she is here for multiple issues. She continues have difficulty with drainage from both ears in spite of using Cortisporin otic suspension. She's had no ear pain and only occasional difficulty with ringing in the ear mainly on the left. She also has a rash present in both axilla that is only causing her some minor difficulty.. She continues to have redness and slight drainage from her umbilical area. She has been using topical medications for that without much success. No abdominal pain, nausea, vomiting.  Review of Systems     Objective:   Physical Exam Alert and in no distress. Both canals are swollen with some debris present. TMs difficult to see. No palpable tenderness upon palpation of the earlobes. Both axilla does show some scattered erythema but no drainage or palpable lesions. Umbilical area shows erythema but no drainage. Abdominal exam shows no masses or tenderness.       Assessment & Plan:  Otitis externa of both ears, unspecified chronicity, unspecified type  Rash and nonspecific skin eruption  Omphalitis in adult  Need for influenza vaccination I will place her on Cipro to see if it'll help with both the omphalitis and with the otitis externa. Will also give Bactroban to help with the umbilical area. Recommend conservative care for the axillary area.

## 2017-07-01 NOTE — Telephone Encounter (Signed)
This encounter was created in error - please disregard.

## 2017-07-31 ENCOUNTER — Other Ambulatory Visit: Payer: Self-pay | Admitting: Family Medicine

## 2017-08-01 ENCOUNTER — Telehealth: Payer: Self-pay | Admitting: Family Medicine

## 2017-08-01 DIAGNOSIS — H6593 Unspecified nonsuppurative otitis media, bilateral: Secondary | ICD-10-CM

## 2017-08-01 NOTE — Telephone Encounter (Signed)
Not specifically mentioned in Dr. Lanice Shirts note from last month.  Going to let him weigh in on referral

## 2017-08-01 NOTE — Telephone Encounter (Signed)
Ok to refer.

## 2017-08-01 NOTE — Telephone Encounter (Signed)
Pt states she is having issues again with fluid building up in her ears. States you told her last month if it happened again you would refer her to ENT and she would like the referral.  She doesn't have a preference where.

## 2017-08-02 NOTE — Telephone Encounter (Signed)
Referal entered.

## 2017-08-12 DIAGNOSIS — Z1231 Encounter for screening mammogram for malignant neoplasm of breast: Secondary | ICD-10-CM | POA: Diagnosis not present

## 2017-08-12 LAB — HM MAMMOGRAPHY

## 2017-08-13 ENCOUNTER — Encounter: Payer: Self-pay | Admitting: Internal Medicine

## 2017-08-15 ENCOUNTER — Telehealth: Payer: Self-pay | Admitting: Family Medicine

## 2017-08-15 NOTE — Telephone Encounter (Signed)
Pt called and had not heard back about referral, rebecca CMA is not here for me to ask if this was completed, so I have gone ahead and scheduled patient and pt is aware of appt but I will also mail her appt info and I have faxed over office notes to Dr. Benjamine Mola   Dr. Benjamine Mola 09/11/17 @ 3:00pm Address: [address] [place], [place]  Phone # [phone]

## 2017-08-15 NOTE — Telephone Encounter (Signed)
Pt called concerning referral to Ear specialist. I can seen order in system but pt states she called the ear doc and they said they had not received referral. Spoke to [place] and she states she will handle as soon as she is able. Pt was called and message was left advising her of the above. Sending message to [place]. Please advise pt at [phone].

## 2017-08-23 ENCOUNTER — Other Ambulatory Visit: Payer: Self-pay | Admitting: Family Medicine

## 2017-08-23 NOTE — Telephone Encounter (Signed)
Pt was given refill on 03/04/17 for 6 months. She has an appt on November 13th so she should be fine for her appt

## 2017-08-31 ENCOUNTER — Telehealth: Payer: Self-pay | Admitting: Family Medicine

## 2017-08-31 NOTE — Telephone Encounter (Signed)
Recv'd fax for P.A. ONE TOUCH ULTRA BLUE TEST STRIPS, called [facility] and was error, no P.A. Needed, they are covered and pt picked up already

## 2017-09-03 ENCOUNTER — Ambulatory Visit (INDEPENDENT_AMBULATORY_CARE_PROVIDER_SITE_OTHER): Payer: Medicare Other | Admitting: Family Medicine

## 2017-09-03 VITALS — BP 110/70 | HR 71 | Resp 16 | Wt 218.8 lb

## 2017-09-03 DIAGNOSIS — E1169 Type 2 diabetes mellitus with other specified complication: Secondary | ICD-10-CM

## 2017-09-03 DIAGNOSIS — E118 Type 2 diabetes mellitus with unspecified complications: Secondary | ICD-10-CM | POA: Diagnosis not present

## 2017-09-03 DIAGNOSIS — I10 Essential (primary) hypertension: Secondary | ICD-10-CM

## 2017-09-03 DIAGNOSIS — L304 Erythema intertrigo: Secondary | ICD-10-CM

## 2017-09-03 DIAGNOSIS — E785 Hyperlipidemia, unspecified: Secondary | ICD-10-CM | POA: Diagnosis not present

## 2017-09-03 DIAGNOSIS — E669 Obesity, unspecified: Secondary | ICD-10-CM | POA: Diagnosis not present

## 2017-09-03 DIAGNOSIS — E1159 Type 2 diabetes mellitus with other circulatory complications: Secondary | ICD-10-CM

## 2017-09-03 LAB — POCT GLYCOSYLATED HEMOGLOBIN (HGB A1C): HEMOGLOBIN A1C: 6.2

## 2017-09-03 MED ORDER — CLARITHROMYCIN 500 MG PO TABS: 500.0000 mg | ORAL_TABLET | Freq: Two times a day (BID) | ORAL | 0 refills | Status: DC

## 2017-09-03 MED ORDER — PIOGLITAZONE HCL 30 MG PO TABS: 30.0000 mg | ORAL_TABLET | Freq: Every day | ORAL | 1 refills | Status: DC

## 2017-09-03 NOTE — Progress Notes (Signed)
  Subjective:    Patient ID: [name], female    DOB: 07-Dec-1950, 66 y.o.   MRN: [id number]  [name] is a 66 y.o. female who presents for follow-up of Type 2 diabetes mellitus.  Home blood sugar records: fasting range: 120 Current symptoms/problems include none and have been unchanged. Daily foot checks:   Any foot concerns: No Exercise: Not been exercising recently due to cold. Diet: Regular diet She has been having trouble recently with erythema and discomfort in the inguinal crease areas bilaterally.  She has used OTC meds for this.  She continues on Actos and metformin.  Having no difficulty with that.  Is also taking lisinopril/HCTZ. The following portions of the patient's history were reviewed and updated as appropriate: allergies, current medications, past medical history, past social history and problem list.  ROS as in subjective above.     Objective:    Physical Exam Alert and in no distress exam of the inguinal creases does show diffuse erythema but no satellite lesions or drainage..  Blood pressure 110/70, pulse 71, resp. rate 16, weight 218 lb 12.8 oz (99.2 kg), SpO2 99 %.  Lab Review Diabetic Labs Latest Ref Rng & Units 09/03/2017 05/01/2017 12/26/2016 08/28/2016 05/14/2016  HbA1c - 6.2 6.4 6.2 6.1 6.0  Microalbumin mg/L - - <5.0 - -  Micro/Creat Ratio - - - <7.4 - -  Chol <200 mg/dL - 133 - - -  HDL >50 mg/dL - 52 - - -  Calc LDL <100 mg/dL - 63 - - -  Triglycerides <150 mg/dL - 89 - - -  Creatinine 0.50 - 0.99 mg/dL - 0.80 - - -   BP/Weight 09/03/2017 07/01/2017 05/01/2017 3/54/6568 10/24/7515  Systolic BP 001 749 449 675 916  Diastolic BP 70 80 70 80 70  Wt. (Lbs) 218.8 217.4 216 220 216  BMI 37.56 37.32 37.08 37.76 37.08   Foot/eye exam completion dates Latest Ref Rng & Units 05/01/2017 12/10/2016  Eye Exam No Retinopathy - No Retinopathy  Foot Form Completion - Done -  A1c is 6.2  [place]  reports that  has never smoked. she has never used smokeless  tobacco. She reports that she does not drink alcohol or use drugs.     Assessment & Plan:    Controlled type 2 diabetes mellitus with complication, without long-term current use of insulin (HCC) - Plan: pioglitazone (ACTOS) 30 MG tablet  Hyperlipidemia associated with type 2 diabetes mellitus ([place]) - Plan: HgB A1c  Hypertension associated with diabetes (HCC)  Obesity (BMI 30-39.9)  Erythema intertrigo - Plan: clarithromycin (BIAXIN) 500 MG tablet  1. Rx changes: none 2. Education: Reviewed 'ABCs' of diabetes management (respective goals in parentheses):  A1C (<7), blood pressure (<130/80), and cholesterol (LDL <100). 3. Compliance at present is estimated to be good. Efforts to improve compliance (if necessary) will be directed at increased exercise. 4. Follow up: 4 months Recommend she keep the inguinal creases as dry as possible.  Also recommend she use Bactroban ointment to see if this will help as I feel that this is probably a bacterial overgrowth and not a yeast infection.

## 2017-09-04 ENCOUNTER — Other Ambulatory Visit: Payer: Self-pay | Admitting: Family Medicine

## 2017-09-11 DIAGNOSIS — H608X3 Other otitis externa, bilateral: Secondary | ICD-10-CM | POA: Diagnosis not present

## 2017-09-11 DIAGNOSIS — H6123 Impacted cerumen, bilateral: Secondary | ICD-10-CM | POA: Diagnosis not present

## 2017-09-13 ENCOUNTER — Other Ambulatory Visit: Payer: Self-pay | Admitting: Family Medicine

## 2017-09-13 DIAGNOSIS — E118 Type 2 diabetes mellitus with unspecified complications: Secondary | ICD-10-CM

## 2017-11-05 ENCOUNTER — Other Ambulatory Visit: Payer: Self-pay | Admitting: Family Medicine

## 2017-11-18 DIAGNOSIS — Z85828 Personal history of other malignant neoplasm of skin: Secondary | ICD-10-CM | POA: Diagnosis not present

## 2017-11-18 DIAGNOSIS — L821 Other seborrheic keratosis: Secondary | ICD-10-CM | POA: Diagnosis not present

## 2017-11-18 DIAGNOSIS — L814 Other melanin hyperpigmentation: Secondary | ICD-10-CM | POA: Diagnosis not present

## 2017-11-18 DIAGNOSIS — D225 Melanocytic nevi of trunk: Secondary | ICD-10-CM | POA: Diagnosis not present

## 2017-11-18 DIAGNOSIS — D1801 Hemangioma of skin and subcutaneous tissue: Secondary | ICD-10-CM | POA: Diagnosis not present

## 2017-11-25 DIAGNOSIS — H52222 Regular astigmatism, left eye: Secondary | ICD-10-CM | POA: Diagnosis not present

## 2017-11-25 DIAGNOSIS — H5202 Hypermetropia, left eye: Secondary | ICD-10-CM | POA: Diagnosis not present

## 2017-11-25 DIAGNOSIS — E119 Type 2 diabetes mellitus without complications: Secondary | ICD-10-CM | POA: Diagnosis not present

## 2017-11-25 DIAGNOSIS — I10 Essential (primary) hypertension: Secondary | ICD-10-CM | POA: Diagnosis not present

## 2017-11-25 DIAGNOSIS — H25093 Other age-related incipient cataract, bilateral: Secondary | ICD-10-CM | POA: Diagnosis not present

## 2017-12-10 ENCOUNTER — Ambulatory Visit (INDEPENDENT_AMBULATORY_CARE_PROVIDER_SITE_OTHER): Payer: Medicare Other | Admitting: Ophthalmology

## 2017-12-10 DIAGNOSIS — H35033 Hypertensive retinopathy, bilateral: Secondary | ICD-10-CM

## 2017-12-10 DIAGNOSIS — H2513 Age-related nuclear cataract, bilateral: Secondary | ICD-10-CM | POA: Diagnosis not present

## 2017-12-10 DIAGNOSIS — H353111 Nonexudative age-related macular degeneration, right eye, early dry stage: Secondary | ICD-10-CM | POA: Diagnosis not present

## 2017-12-10 DIAGNOSIS — H43813 Vitreous degeneration, bilateral: Secondary | ICD-10-CM | POA: Diagnosis not present

## 2017-12-10 DIAGNOSIS — H35372 Puckering of macula, left eye: Secondary | ICD-10-CM | POA: Diagnosis not present

## 2017-12-10 DIAGNOSIS — I10 Essential (primary) hypertension: Secondary | ICD-10-CM | POA: Diagnosis not present

## 2017-12-10 LAB — HM DIABETES EYE EXAM

## 2017-12-11 DIAGNOSIS — H608X3 Other otitis externa, bilateral: Secondary | ICD-10-CM | POA: Diagnosis not present

## 2017-12-18 ENCOUNTER — Other Ambulatory Visit: Payer: Self-pay | Admitting: Family Medicine

## 2017-12-18 DIAGNOSIS — E118 Type 2 diabetes mellitus with unspecified complications: Secondary | ICD-10-CM

## 2017-12-27 ENCOUNTER — Other Ambulatory Visit: Payer: Self-pay | Admitting: Family Medicine

## 2018-01-06 ENCOUNTER — Encounter: Payer: Self-pay | Admitting: Family Medicine

## 2018-01-06 ENCOUNTER — Ambulatory Visit (INDEPENDENT_AMBULATORY_CARE_PROVIDER_SITE_OTHER): Payer: Medicare Other | Admitting: Family Medicine

## 2018-01-06 VITALS — BP 126/80 | HR 67 | Wt 225.8 lb

## 2018-01-06 DIAGNOSIS — E785 Hyperlipidemia, unspecified: Secondary | ICD-10-CM | POA: Diagnosis not present

## 2018-01-06 DIAGNOSIS — E1136 Type 2 diabetes mellitus with diabetic cataract: Secondary | ICD-10-CM | POA: Diagnosis not present

## 2018-01-06 DIAGNOSIS — E118 Type 2 diabetes mellitus with unspecified complications: Secondary | ICD-10-CM

## 2018-01-06 DIAGNOSIS — E1169 Type 2 diabetes mellitus with other specified complication: Secondary | ICD-10-CM | POA: Diagnosis not present

## 2018-01-06 DIAGNOSIS — I10 Essential (primary) hypertension: Secondary | ICD-10-CM

## 2018-01-06 DIAGNOSIS — Z9849 Cataract extraction status, unspecified eye: Secondary | ICD-10-CM | POA: Insufficient documentation

## 2018-01-06 DIAGNOSIS — E1159 Type 2 diabetes mellitus with other circulatory complications: Secondary | ICD-10-CM | POA: Diagnosis not present

## 2018-01-06 DIAGNOSIS — E669 Obesity, unspecified: Secondary | ICD-10-CM

## 2018-01-06 DIAGNOSIS — I152 Hypertension secondary to endocrine disorders: Secondary | ICD-10-CM

## 2018-01-06 LAB — POCT GLYCOSYLATED HEMOGLOBIN (HGB A1C): Hemoglobin A1C: 6.1

## 2018-01-06 NOTE — Addendum Note (Signed)
Addended by: Elyse Jarvis on: 01/06/2018 01:21 PM   Modules accepted: Orders

## 2018-01-06 NOTE — Progress Notes (Signed)
  Subjective:    Patient ID: KRISALYN YANKOWSKI, female    DOB: [birth year], 67 y.o.   MRN: [id number]  ANAMARI GALEAS is a 67 y.o. female who presents for follow-up of Type 2 diabetes mellitus.  Patient is checking home blood sugars.   Home blood sugar records:yes How often is blood sugars being checked: once a day average 121 in the morning Current symptoms/problems include none and have been unchanged. Daily foot checks:yes   Any foot concerns: no Last eye exam: feb 2019 she is scheduled again to be seen for reevaluation of her cataract. Exercise: walking once a week for 30 min She continues on metformin as well as Actos and having no difficulty with that.  Atorvastatin is causing no aches or pains.  Lisinopril/HCTZ also being taken.  No concerns or complaints. The following portions of the patient's history were reviewed and updated as appropriate: allergies, current medications, past medical history, past social history and problem list.  ROS as in subjective above.     Objective:    Physical Exam  Lab Review Diabetic Labs Latest Ref Rng & Units 09/03/2017 05/01/2017 12/26/2016 08/28/2016 05/14/2016  HbA1c - 6.2 6.4 6.2 6.1 6.0  Microalbumin mg/L - - <5.0 - -  Micro/Creat Ratio - - - <7.4 - -  Chol <200 mg/dL - 133 - - -  HDL >50 mg/dL - 52 - - -  Calc LDL <100 mg/dL - 63 - - -  Triglycerides <150 mg/dL - 89 - - -  Creatinine 0.50 - 0.99 mg/dL - 0.80 - - -   BP/Weight 09/03/2017 07/01/2017 05/01/2017 0/30/0923 3/0/0762  Systolic BP 263 335 456 256 389  Diastolic BP 70 80 70 80 70  Wt. (Lbs) 218.8 217.4 216 220 216  BMI 37.56 37.32 37.08 37.76 37.08   Foot/eye exam completion dates Latest Ref Rng & Units 05/01/2017 12/10/2016  Eye Exam No Retinopathy - No Retinopathy  Foot Form Completion - Done -  A1c is 6.1 [place]  reports that  has never smoked. she has never used smokeless tobacco. She reports that she does not drink alcohol or use drugs.     Assessment & Plan:      Hyperlipidemia associated with type 2 diabetes mellitus ([place])  Controlled type 2 diabetes mellitus with complication, without long-term current use of insulin ([place])  Hypertension associated with diabetes ([place])  Obesity (BMI 30-39.9)  Cataract associated with type 2 diabetes mellitus ([place])   1. Rx changes: none 2. Education: Reviewed 'ABCs' of diabetes management (respective goals in parentheses):  A1C (<7), blood pressure (<130/80), and cholesterol (LDL <100). 3. Compliance at present is estimated to be good. Efforts to improve compliance (if necessary) will be directed at increased exercise. 4. Follow up: 4 months

## 2018-01-06 NOTE — Patient Instructions (Signed)
20 minutes of something physical every day 

## 2018-01-13 ENCOUNTER — Telehealth: Payer: Self-pay | Admitting: Family Medicine

## 2018-01-13 MED ORDER — ONDANSETRON HCL 4 MG PO TABS: 4.0000 mg | ORAL_TABLET | Freq: Three times a day (TID) | ORAL | 0 refills | Status: DC | PRN

## 2018-01-13 NOTE — Telephone Encounter (Signed)
Called pt but no answer lvm about med that was called in. [facility]

## 2018-01-13 NOTE — Telephone Encounter (Signed)
Pt called and states that she has been throwing up and had some loose stools they started around 5.30 this morning, she is wondering what you recommend that she take,  She has take a few sips of ginger ale to try and settle her stomach,, pt uses [facility] [phone] - [place], [place] pt can be reached at [phone]

## 2018-01-13 NOTE — Telephone Encounter (Signed)
Let her know that I did call some medication in.

## 2018-01-14 DIAGNOSIS — H2513 Age-related nuclear cataract, bilateral: Secondary | ICD-10-CM | POA: Diagnosis not present

## 2018-01-14 DIAGNOSIS — H40013 Open angle with borderline findings, low risk, bilateral: Secondary | ICD-10-CM | POA: Diagnosis not present

## 2018-01-14 DIAGNOSIS — H40033 Anatomical narrow angle, bilateral: Secondary | ICD-10-CM | POA: Diagnosis not present

## 2018-01-21 ENCOUNTER — Encounter: Payer: Self-pay | Admitting: Family Medicine

## 2018-01-23 DIAGNOSIS — H5703 Miosis: Secondary | ICD-10-CM | POA: Diagnosis not present

## 2018-01-23 DIAGNOSIS — H52221 Regular astigmatism, right eye: Secondary | ICD-10-CM | POA: Diagnosis not present

## 2018-01-23 DIAGNOSIS — H2511 Age-related nuclear cataract, right eye: Secondary | ICD-10-CM | POA: Diagnosis not present

## 2018-01-23 DIAGNOSIS — Z961 Presence of intraocular lens: Secondary | ICD-10-CM | POA: Diagnosis not present

## 2018-02-10 ENCOUNTER — Other Ambulatory Visit: Payer: Self-pay | Admitting: Family Medicine

## 2018-03-03 ENCOUNTER — Other Ambulatory Visit: Payer: Self-pay | Admitting: Family Medicine

## 2018-03-03 DIAGNOSIS — E118 Type 2 diabetes mellitus with unspecified complications: Secondary | ICD-10-CM

## 2018-03-20 ENCOUNTER — Other Ambulatory Visit: Payer: Self-pay | Admitting: Family Medicine

## 2018-03-20 DIAGNOSIS — E118 Type 2 diabetes mellitus with unspecified complications: Secondary | ICD-10-CM

## 2018-03-28 ENCOUNTER — Encounter: Payer: Self-pay | Admitting: Family Medicine

## 2018-03-28 ENCOUNTER — Ambulatory Visit (INDEPENDENT_AMBULATORY_CARE_PROVIDER_SITE_OTHER): Payer: Medicare Other | Admitting: Family Medicine

## 2018-03-28 ENCOUNTER — Other Ambulatory Visit: Payer: Self-pay

## 2018-03-28 VITALS — BP 122/80 | HR 71 | Temp 98.1°F | Ht 63.75 in | Wt 221.6 lb

## 2018-03-28 DIAGNOSIS — J069 Acute upper respiratory infection, unspecified: Secondary | ICD-10-CM

## 2018-03-28 MED ORDER — ONETOUCH ULTRASOFT LANCETS MISC: 0 refills | Status: DC

## 2018-03-28 MED ORDER — GLUCOSE BLOOD VI STRP: ORAL_STRIP | 2 refills | Status: DC

## 2018-03-28 NOTE — Progress Notes (Signed)
   Subjective:    Patient ID: [name], female    DOB: 03/23/1951, 67 y.o.   MRN: [id number]  HPI She complains of a 10-day history of cough, sinus congestion, PND but no sore throat, fever, chills.  She states that she is at least 75% better.  She does have cataract surgery scheduled for June 20 and wants to be sure she will be safe to have the surgery.   Review of Systems     Objective:   Physical Exam Alert and in no distress. Tympanic membranes and canals are normal. Pharyngeal area is normal. Neck is supple without adenopathy or thyromegaly. Cardiac exam shows a regular sinus rhythm without murmurs or gallops. Lungs are clear to auscultation.        Assessment & Plan:  Viral upper respiratory tract infection  I explained that she is getting over her URI and it should not have an effect on her surgery.

## 2018-04-03 ENCOUNTER — Other Ambulatory Visit: Payer: Self-pay | Admitting: Family Medicine

## 2018-04-06 DIAGNOSIS — H2512 Age-related nuclear cataract, left eye: Secondary | ICD-10-CM | POA: Diagnosis not present

## 2018-04-10 DIAGNOSIS — Z961 Presence of intraocular lens: Secondary | ICD-10-CM | POA: Diagnosis not present

## 2018-04-10 DIAGNOSIS — H52221 Regular astigmatism, right eye: Secondary | ICD-10-CM | POA: Diagnosis not present

## 2018-04-10 DIAGNOSIS — H524 Presbyopia: Secondary | ICD-10-CM | POA: Diagnosis not present

## 2018-04-10 DIAGNOSIS — H2512 Age-related nuclear cataract, left eye: Secondary | ICD-10-CM | POA: Diagnosis not present

## 2018-04-24 ENCOUNTER — Encounter: Payer: Self-pay | Admitting: Family Medicine

## 2018-04-25 ENCOUNTER — Ambulatory Visit (INDEPENDENT_AMBULATORY_CARE_PROVIDER_SITE_OTHER): Payer: Medicare Other | Admitting: Family Medicine

## 2018-04-25 ENCOUNTER — Encounter: Payer: Self-pay | Admitting: Family Medicine

## 2018-04-25 VITALS — BP 110/60 | HR 67 | Temp 97.6°F | Wt 222.6 lb

## 2018-04-25 DIAGNOSIS — R21 Rash and other nonspecific skin eruption: Secondary | ICD-10-CM | POA: Diagnosis not present

## 2018-04-25 DIAGNOSIS — W57XXXA Bitten or stung by nonvenomous insect and other nonvenomous arthropods, initial encounter: Secondary | ICD-10-CM | POA: Diagnosis not present

## 2018-04-25 MED ORDER — TRIAMCINOLONE ACETONIDE 0.1 % EX CREA: 1.0000 "application " | TOPICAL_CREAM | Freq: Two times a day (BID) | CUTANEOUS | 0 refills | Status: DC

## 2018-04-25 NOTE — Patient Instructions (Signed)
Call a pest control company today.  Wash all clothing, linens etc in hot water and dry on high heat.   Use the topical steroid cream for the next week.  Take an antihistamine for itching. Cool compresses may also help.    Bedbugs Bedbugs are tiny bugs that live in and around beds. They stay hidden during the day, and they come out at night and bite. Bedbugs need blood to live and grow. Where are bedbugs found? Bedbugs can be found anywhere, whether a place is clean or dirty. They are most often found in places where many people come and go, such as hotels, shelters, dorms, and health care settings. It is also common for them to be found in homes where there are many birds or bats nearby. What are bedbug bites like? A bedbug bite leaves a small red bump with a darker red dot in the middle. The bump may appear soon after a person is bitten or a day or more later. Bedbug bites usually do not hurt, but they may itch. Most people do not need treatment for bedbug bites. The bumps usually go away on their own in a few days. How do I check for bedbugs? Bedbugs are reddish-brown, oval, and flat. They range in size from 1 mm to 7 mm and they cannot fly. Look for bedbugs in these places:  On mattresses, bed frames, headboards, and box springs.  On drapes and curtains in bedrooms.  Under carpeting in bedrooms.  Behind electrical outlets.  Behind any wallpaper that is peeling.  Inside luggage.  Also look for black or red spots or stains on or near the bed. Stains can come from bedbugs that have been crushed or from bedbug waste. What should I do if I find bedbugs? When Traveling If you find bedbugs while traveling, check all of your possessions carefully before you bring them into your home. Consider throwing away anything that has bedbugs on it. At Home If you find bedbugs at home, your bedroom may need to be treated by a pest control expert. You may also need to throw away mattresses or  luggage. To help keep bedbugs from coming back, consider taking these actions:  Put a plastic cover over your mattress.  Wash your clothes and bedding in water that is hotter than 120F (48.9C) and dry them on a hot setting. Bedbugs are killed by high temperatures.  Vacuum often around the bed and in all of the cracks and crevices where the bugs might hide.  Carefully check all used furniture, bedding, or clothes that you bring into your home.  Eliminate bird nests and bat roosts that are near your home.  In Your Bed If you find bedbugs in your bed, consider wearing pajamas that have long sleeves and pant legs. Bedbugs usually bite areas of the skin that are not covered. This information is not intended to replace advice given to you by your health care provider. Make sure you discuss any questions you have with your health care provider. Document Released: 11/10/2010 Document Revised: 03/15/2016 Document Reviewed: 10/04/2014 Elsevier Interactive Patient Education  [employer].

## 2018-04-25 NOTE — Progress Notes (Signed)
   Subjective:    Patient ID: [name], female    DOB: 1951/06/19, 67 y.o.   MRN: [id number]  HPI Chief Complaint  Patient presents with  . bed bugs    bed bugs- all over couch, and bed.    She is here with complaints of multiple pruritic bites to her right lateral neck, bilateral upper extremities. She brought in bugs in a ziplock that appear to be bed bugs she found in her sofa. States her husband also has bites.  She has used hydrocortisone cream with some relief.   Denies fever, chills, dizziness, headache, chest pain, palpitations, shortness of breath, abdominal pain, N/V/D. No rash on palms or soles.   Reviewed allergies, medications, past medical, surgical, family, and social history.   Review of Systems Pertinent positives and negatives in the history of present illness.     Objective:   Physical Exam BP 110/60   Pulse 67   Temp 97.6 F (36.4 C) (Oral)   Wt 222 lb 9.6 oz (101 kg)   BMI 38.51 kg/m   Multiple red pruritic bumps to her right lateral neck and right arm. No sign of infection.       Assessment & Plan:  Rash - Plan: triamcinolone cream (KENALOG) 0.1 %  Bedbug bite, initial encounter  Bed bugs with her today. Will have her call a pest control company. Kenalog prescribed and she will take an antihistamine as needed for itching. Discussed eradication of bugs is priority. No sign of infection.  Follow up as needed.

## 2018-05-12 ENCOUNTER — Ambulatory Visit (INDEPENDENT_AMBULATORY_CARE_PROVIDER_SITE_OTHER): Payer: Medicare Other | Admitting: Family Medicine

## 2018-05-12 ENCOUNTER — Telehealth: Payer: Self-pay

## 2018-05-12 ENCOUNTER — Encounter: Payer: Self-pay | Admitting: Family Medicine

## 2018-05-12 VITALS — BP 110/68 | HR 58 | Temp 97.8°F | Ht 63.0 in | Wt 220.0 lb

## 2018-05-12 DIAGNOSIS — E785 Hyperlipidemia, unspecified: Secondary | ICD-10-CM

## 2018-05-12 DIAGNOSIS — I10 Essential (primary) hypertension: Secondary | ICD-10-CM

## 2018-05-12 DIAGNOSIS — E1169 Type 2 diabetes mellitus with other specified complication: Secondary | ICD-10-CM

## 2018-05-12 DIAGNOSIS — E118 Type 2 diabetes mellitus with unspecified complications: Secondary | ICD-10-CM | POA: Diagnosis not present

## 2018-05-12 DIAGNOSIS — E1159 Type 2 diabetes mellitus with other circulatory complications: Secondary | ICD-10-CM | POA: Diagnosis not present

## 2018-05-12 DIAGNOSIS — E669 Obesity, unspecified: Secondary | ICD-10-CM

## 2018-05-12 DIAGNOSIS — E1136 Type 2 diabetes mellitus with diabetic cataract: Secondary | ICD-10-CM

## 2018-05-12 DIAGNOSIS — I152 Hypertension secondary to endocrine disorders: Secondary | ICD-10-CM

## 2018-05-12 LAB — POCT UA - MICROALBUMIN
Albumin/Creatinine Ratio, Urine, POC: 8.3
CREATININE, POC: 156.9 mg/dL
Microalbumin Ur, POC: 13 mg/L

## 2018-05-12 LAB — POCT GLYCOSYLATED HEMOGLOBIN (HGB A1C): Hemoglobin A1C: 6.2 % — AB (ref 4.0–5.6)

## 2018-05-12 MED ORDER — LISINOPRIL-HYDROCHLOROTHIAZIDE 20-12.5 MG PO TABS: 1.0000 | ORAL_TABLET | Freq: Every day | ORAL | 3 refills | Status: DC

## 2018-05-12 MED ORDER — ATORVASTATIN CALCIUM 10 MG PO TABS: ORAL_TABLET | ORAL | 3 refills | Status: DC

## 2018-05-12 MED ORDER — METFORMIN HCL 500 MG PO TABS: ORAL_TABLET | ORAL | 1 refills | Status: DC

## 2018-05-12 MED ORDER — PIOGLITAZONE HCL 30 MG PO TABS: ORAL_TABLET | ORAL | 1 refills | Status: DC

## 2018-05-12 NOTE — Patient Instructions (Addendum)
Occasionally check your blood sugars 2 hours after meals Work towards 10,000 steps per day

## 2018-05-12 NOTE — Progress Notes (Signed)
  Subjective:    Patient ID: [name], female    DOB: Oct 09, 1951, 67 y.o.   MRN: [id number]  [name] is a 67 y.o. female who presents for follow-up of Type 2 diabetes mellitus.  Patient is checking home blood sugars.   Home blood sugar records: manuel record How often is blood sugars being checked: QD Current symptoms/problems include none and have been unchanged. Daily foot checks: yes  Any foot concerns: no Last eye exam: march 2019 Exercise:not that much but still walking  The following portions of the patient's history were reviewed and updated as appropriate: allergies, current medications, past medical history, past social history and problem list.  ROS as in subjective above.     Objective:    Physical Exam Alert and in no distress diabetic foot exam normal   Lab Review Diabetic Labs Latest Ref Rng & Units 01/06/2018 09/03/2017 05/01/2017 12/26/2016 08/28/2016  HbA1c - 6.1 6.2 6.4 6.2 6.1  Microalbumin mg/L - - - <5.0 -  Micro/Creat Ratio - - - - <7.4 -  Chol <200 mg/dL - - 133 - -  HDL >50 mg/dL - - 52 - -  Calc LDL <100 mg/dL - - 63 - -  Triglycerides <150 mg/dL - - 89 - -  Creatinine 0.50 - 0.99 mg/dL - - 0.80 - -   BP/Weight 05/12/2018 04/25/2018 03/28/2018 01/06/2018 97/67/3419  Systolic BP 379 024 097 353 299  Diastolic BP 68 60 80 80 70  Wt. (Lbs) 220 222.6 221.6 225.8 218.8  BMI 38.97 38.51 38.34 38.76 37.56   Foot/eye exam completion dates Latest Ref Rng & Units 05/01/2017 12/10/2016  Eye Exam No Retinopathy - No Retinopathy  Foot Form Completion - Done -  A1c is 6.2  [name]  reports that she has never smoked. She has never used smokeless tobacco. She reports that she does not drink alcohol or use drugs.     Assessment & Plan:    Controlled type 2 diabetes mellitus with complication, without long-term current use of insulin ([name]) - Plan: CBC with Differential/Platelet, Comprehensive metabolic panel, Lipid panel, POCT UA - Microalbumin, pioglitazone  (ACTOS) 30 MG tablet  Hypertension associated with diabetes ([name]) - Plan: CBC with Differential/Platelet, Comprehensive metabolic panel, lisinopril-hydrochlorothiazide (PRINZIDE,ZESTORETIC) 20-12.5 MG tablet  Obesity (BMI 30-39.9) - Plan: CBC with Differential/Platelet, Comprehensive metabolic panel, Lipid panel  Cataract associated with type 2 diabetes mellitus (HCC)  Hyperlipidemia associated with type 2 diabetes mellitus (HCC) - Plan: Lipid panel, atorvastatin (LIPITOR) 10 MG tablet  Type 2 diabetes mellitus with complication, without long-term current use of insulin (HCC) - Plan: metFORMIN (GLUCOPHAGE) 500 MG tablet   1. Rx changes: none 2. Education: Reviewed 'ABCs' of diabetes management (respective goals in parentheses):  A1C (<7), blood pressure (<130/80), and cholesterol (LDL <100). 3. Compliance at present is estimated to be good. Efforts to improve compliance (if necessary) will be directed at increased exercise. 4. Follow up: 4 months

## 2018-05-12 NOTE — Telephone Encounter (Signed)
Pt called to inform us that the medications prescribed today went to the wrong pharmacy. They should have went to [facility]

## 2018-05-13 ENCOUNTER — Other Ambulatory Visit: Payer: Self-pay | Admitting: Family Medicine

## 2018-05-13 LAB — CBC WITH DIFFERENTIAL/PLATELET
BASOS ABS: 0 10*3/uL (ref 0.0–0.2)
Basos: 1 %
EOS (ABSOLUTE): 0.1 10*3/uL (ref 0.0–0.4)
EOS: 3 %
HEMATOCRIT: 36.2 % (ref 34.0–46.6)
Hemoglobin: 11.9 g/dL (ref 11.1–15.9)
IMMATURE GRANULOCYTES: 0 %
Immature Grans (Abs): 0 10*3/uL (ref 0.0–0.1)
Lymphocytes Absolute: 0.9 10*3/uL (ref 0.7–3.1)
Lymphs: 26 %
MCH: 30.2 pg (ref 26.6–33.0)
MCHC: 32.9 g/dL (ref 31.5–35.7)
MCV: 92 fL (ref 79–97)
Monocytes Absolute: 0.3 10*3/uL (ref 0.1–0.9)
Monocytes: 9 %
NEUTROS PCT: 61 %
Neutrophils Absolute: 2.2 10*3/uL (ref 1.4–7.0)
PLATELETS: 236 10*3/uL (ref 150–450)
RBC: 3.94 x10E6/uL (ref 3.77–5.28)
RDW: 14.4 % (ref 12.3–15.4)
WBC: 3.6 10*3/uL (ref 3.4–10.8)

## 2018-05-13 LAB — COMPREHENSIVE METABOLIC PANEL
ALK PHOS: 75 IU/L (ref 39–117)
ALT: 22 IU/L (ref 0–32)
AST: 18 IU/L (ref 0–40)
Albumin/Globulin Ratio: 1.7 (ref 1.2–2.2)
Albumin: 4.3 g/dL (ref 3.6–4.8)
BUN/Creatinine Ratio: 31 — ABNORMAL HIGH (ref 12–28)
BUN: 24 mg/dL (ref 8–27)
Bilirubin Total: 0.2 mg/dL (ref 0.0–1.2)
CALCIUM: 9.6 mg/dL (ref 8.7–10.3)
CO2: 24 mmol/L (ref 20–29)
CREATININE: 0.77 mg/dL (ref 0.57–1.00)
Chloride: 102 mmol/L (ref 96–106)
GFR calc Af Amer: 92 mL/min/{1.73_m2} (ref >59)
GFR, EST NON AFRICAN AMERICAN: 80 mL/min/{1.73_m2} (ref >59)
GLUCOSE: 120 mg/dL — AB (ref 65–99)
Globulin, Total: 2.5 g/dL (ref 1.5–4.5)
Potassium: 4.1 mmol/L (ref 3.5–5.2)
Sodium: 143 mmol/L (ref 134–144)
Total Protein: 6.8 g/dL (ref 6.0–8.5)

## 2018-05-13 LAB — LIPID PANEL
CHOL/HDL RATIO: 2.7 ratio (ref 0.0–4.4)
CHOLESTEROL TOTAL: 139 mg/dL (ref 100–199)
HDL: 51 mg/dL (ref >39)
LDL Calculated: 68 mg/dL (ref 0–99)
TRIGLYCERIDES: 101 mg/dL (ref 0–149)
VLDL Cholesterol Cal: 20 mg/dL (ref 5–40)

## 2018-05-22 ENCOUNTER — Other Ambulatory Visit: Payer: Self-pay

## 2018-06-18 DIAGNOSIS — H6123 Impacted cerumen, bilateral: Secondary | ICD-10-CM | POA: Diagnosis not present

## 2018-06-18 DIAGNOSIS — H608X3 Other otitis externa, bilateral: Secondary | ICD-10-CM | POA: Diagnosis not present

## 2018-08-13 DIAGNOSIS — Z1231 Encounter for screening mammogram for malignant neoplasm of breast: Secondary | ICD-10-CM | POA: Diagnosis not present

## 2018-08-13 LAB — HM MAMMOGRAPHY

## 2018-09-11 ENCOUNTER — Encounter: Payer: Self-pay | Admitting: Family Medicine

## 2018-09-11 ENCOUNTER — Ambulatory Visit (INDEPENDENT_AMBULATORY_CARE_PROVIDER_SITE_OTHER): Payer: Medicare Other | Admitting: Family Medicine

## 2018-09-11 VITALS — BP 128/80 | HR 62 | Temp 97.8°F | Wt 226.2 lb

## 2018-09-11 DIAGNOSIS — E1159 Type 2 diabetes mellitus with other circulatory complications: Secondary | ICD-10-CM

## 2018-09-11 DIAGNOSIS — E669 Obesity, unspecified: Secondary | ICD-10-CM | POA: Diagnosis not present

## 2018-09-11 DIAGNOSIS — E785 Hyperlipidemia, unspecified: Secondary | ICD-10-CM

## 2018-09-11 DIAGNOSIS — E118 Type 2 diabetes mellitus with unspecified complications: Secondary | ICD-10-CM

## 2018-09-11 DIAGNOSIS — I10 Essential (primary) hypertension: Secondary | ICD-10-CM | POA: Diagnosis not present

## 2018-09-11 DIAGNOSIS — E1169 Type 2 diabetes mellitus with other specified complication: Secondary | ICD-10-CM

## 2018-09-11 DIAGNOSIS — Z23 Encounter for immunization: Secondary | ICD-10-CM

## 2018-09-11 LAB — POCT GLYCOSYLATED HEMOGLOBIN (HGB A1C): HEMOGLOBIN A1C: 6.4 % — AB (ref 4.0–5.6)

## 2018-09-11 NOTE — Patient Instructions (Signed)
20 minutes of something physical every day or 150 minutes a week of something physical. 

## 2018-09-11 NOTE — Progress Notes (Signed)
  Subjective:    Patient ID: [name], female    DOB: April 25, 1951, 67 y.o.   MRN: [id number]  [name] is a 67 y.o. female who presents for follow-up of Type 2 diabetes mellitus.  Patient is checking home blood sugars.   Home blood sugar records: paper record How often is blood sugars being checked: avg. 126 fasting QD Current symptoms/problems include none and have been unchanged. Daily foot checks: yes   Any foot concerns: swelling on and off Last eye exam: 11/2017 Exercise: walking some She continues on Metformin, Actos and having no difficulty with them.  Atorvastatin is causing no aches or pains.  She takes lisinopril/HCTZ and is having no cough or other symptoms with that. The following portions of the patient's history were reviewed and updated as appropriate: allergies, current medications, past medical history, past social history and problem list.  ROS as in subjective above.     Objective:    Physical Exam Alert and in no distress otherwise not examined.    Lab Review Diabetic Labs Latest Ref Rng & Units 05/12/2018 01/06/2018 09/03/2017 05/01/2017 12/26/2016  HbA1c 4.0 - 5.6 % 6.2(A) 6.1 6.2 6.4 6.2  Microalbumin mg/L 13.0 - - - <5.0  Micro/Creat Ratio - 8.3 - - - <7.4  Chol 100 - 199 mg/dL 139 - - 133 -  HDL >39 mg/dL 51 - - 52 -  Calc LDL 0 - 99 mg/dL 68 - - 63 -  Triglycerides 0 - 149 mg/dL 101 - - 89 -  Creatinine 0.57 - 1.00 mg/dL 0.77 - - 0.80 -   BP/Weight 05/12/2018 04/25/2018 03/28/2018 01/06/2018 29/93/7169  Systolic BP 678 938 101 751 025  Diastolic BP 68 60 80 80 70  Wt. (Lbs) 220 222.6 221.6 225.8 218.8  BMI 38.97 38.51 38.34 38.76 37.56   Foot/eye exam completion dates Latest Ref Rng & Units 05/01/2017 12/10/2016  Eye Exam No Retinopathy - No Retinopathy  Foot Form Completion - Done -  A1c is 6.4 [name]  reports that she has never smoked. She has never used smokeless tobacco. She reports that she does not drink alcohol or use drugs.       Assessment & Plan:    Type 2 diabetes mellitus with complication, without long-term current use of insulin (HCC)  Need for influenza vaccination - Plan: Flu vaccine HIGH DOSE PF (Fluzone High dose)  Hypertension associated with diabetes ([place])  Obesity (BMI 30-39.9)  Hyperlipidemia associated with type 2 diabetes mellitus ([place])   1. Rx changes: none 2. Education: Reviewed 'ABCs' of diabetes management (respective goals in parentheses):  A1C (<7), blood pressure (<130/80), and cholesterol (LDL <100). 3. Compliance at present is estimated to be good. Efforts to improve compliance (if necessary) will be directed at increased exercise.  Discussed 20 minutes of something physical daily or 150 minutes a week of something physical. 4. Follow up: 4 months

## 2018-10-08 DIAGNOSIS — H40013 Open angle with borderline findings, low risk, bilateral: Secondary | ICD-10-CM | POA: Diagnosis not present

## 2018-10-08 DIAGNOSIS — H40033 Anatomical narrow angle, bilateral: Secondary | ICD-10-CM | POA: Diagnosis not present

## 2018-10-08 DIAGNOSIS — E119 Type 2 diabetes mellitus without complications: Secondary | ICD-10-CM | POA: Diagnosis not present

## 2018-10-08 DIAGNOSIS — H43811 Vitreous degeneration, right eye: Secondary | ICD-10-CM | POA: Diagnosis not present

## 2018-10-08 DIAGNOSIS — Z961 Presence of intraocular lens: Secondary | ICD-10-CM | POA: Diagnosis not present

## 2018-10-08 LAB — HM DIABETES EYE EXAM

## 2018-11-04 ENCOUNTER — Other Ambulatory Visit: Payer: Self-pay | Admitting: Family Medicine

## 2018-11-04 DIAGNOSIS — E118 Type 2 diabetes mellitus with unspecified complications: Secondary | ICD-10-CM

## 2018-11-18 DIAGNOSIS — I8311 Varicose veins of right lower extremity with inflammation: Secondary | ICD-10-CM | POA: Diagnosis not present

## 2018-11-18 DIAGNOSIS — D1801 Hemangioma of skin and subcutaneous tissue: Secondary | ICD-10-CM | POA: Diagnosis not present

## 2018-11-18 DIAGNOSIS — D225 Melanocytic nevi of trunk: Secondary | ICD-10-CM | POA: Diagnosis not present

## 2018-11-18 DIAGNOSIS — I8312 Varicose veins of left lower extremity with inflammation: Secondary | ICD-10-CM | POA: Diagnosis not present

## 2018-11-18 DIAGNOSIS — Z85828 Personal history of other malignant neoplasm of skin: Secondary | ICD-10-CM | POA: Diagnosis not present

## 2018-11-26 DIAGNOSIS — H524 Presbyopia: Secondary | ICD-10-CM | POA: Diagnosis not present

## 2018-11-26 DIAGNOSIS — Z961 Presence of intraocular lens: Secondary | ICD-10-CM | POA: Diagnosis not present

## 2018-12-16 ENCOUNTER — Encounter (INDEPENDENT_AMBULATORY_CARE_PROVIDER_SITE_OTHER): Payer: Medicare Other | Admitting: Ophthalmology

## 2018-12-16 DIAGNOSIS — H35372 Puckering of macula, left eye: Secondary | ICD-10-CM

## 2018-12-16 DIAGNOSIS — H43813 Vitreous degeneration, bilateral: Secondary | ICD-10-CM | POA: Diagnosis not present

## 2018-12-16 DIAGNOSIS — H353112 Nonexudative age-related macular degeneration, right eye, intermediate dry stage: Secondary | ICD-10-CM

## 2018-12-16 DIAGNOSIS — I10 Essential (primary) hypertension: Secondary | ICD-10-CM

## 2018-12-16 DIAGNOSIS — H35033 Hypertensive retinopathy, bilateral: Secondary | ICD-10-CM | POA: Diagnosis not present

## 2018-12-16 LAB — HM DIABETES EYE EXAM

## 2018-12-17 DIAGNOSIS — H6123 Impacted cerumen, bilateral: Secondary | ICD-10-CM | POA: Diagnosis not present

## 2018-12-17 DIAGNOSIS — H608X3 Other otitis externa, bilateral: Secondary | ICD-10-CM | POA: Diagnosis not present

## 2018-12-18 ENCOUNTER — Other Ambulatory Visit: Payer: Self-pay | Admitting: Family Medicine

## 2018-12-18 DIAGNOSIS — E118 Type 2 diabetes mellitus with unspecified complications: Secondary | ICD-10-CM

## 2018-12-30 ENCOUNTER — Encounter: Payer: Self-pay | Admitting: Family Medicine

## 2018-12-30 ENCOUNTER — Ambulatory Visit (INDEPENDENT_AMBULATORY_CARE_PROVIDER_SITE_OTHER): Payer: Medicare Other | Admitting: Family Medicine

## 2018-12-30 VITALS — BP 128/82 | HR 66 | Temp 98.1°F | Ht 64.0 in | Wt 225.0 lb

## 2018-12-30 DIAGNOSIS — E118 Type 2 diabetes mellitus with unspecified complications: Secondary | ICD-10-CM | POA: Diagnosis not present

## 2018-12-30 DIAGNOSIS — E1136 Type 2 diabetes mellitus with diabetic cataract: Secondary | ICD-10-CM | POA: Diagnosis not present

## 2018-12-30 DIAGNOSIS — E785 Hyperlipidemia, unspecified: Secondary | ICD-10-CM

## 2018-12-30 DIAGNOSIS — E1159 Type 2 diabetes mellitus with other circulatory complications: Secondary | ICD-10-CM | POA: Diagnosis not present

## 2018-12-30 DIAGNOSIS — I10 Essential (primary) hypertension: Secondary | ICD-10-CM | POA: Diagnosis not present

## 2018-12-30 DIAGNOSIS — Z0000 Encounter for general adult medical examination without abnormal findings: Secondary | ICD-10-CM

## 2018-12-30 DIAGNOSIS — E1169 Type 2 diabetes mellitus with other specified complication: Secondary | ICD-10-CM | POA: Diagnosis not present

## 2018-12-30 LAB — POCT URINALYSIS DIP (PROADVANTAGE DEVICE)
BILIRUBIN UA: NEGATIVE
Blood, UA: NEGATIVE
GLUCOSE UA: NEGATIVE mg/dL
Ketones, POC UA: NEGATIVE mg/dL
Leukocytes, UA: NEGATIVE
Nitrite, UA: POSITIVE — AB
Protein Ur, POC: NEGATIVE mg/dL
Specific Gravity, Urine: 1.02
Urobilinogen, Ur: NEGATIVE
pH, UA: 6 (ref 5.0–8.0)

## 2018-12-30 LAB — POCT GLYCOSYLATED HEMOGLOBIN (HGB A1C): Hemoglobin A1C: 6.3 % — AB (ref 4.0–5.6)

## 2018-12-30 MED ORDER — ONETOUCH ULTRASOFT LANCETS MISC: 0 refills | Status: DC

## 2018-12-30 MED ORDER — GLUCOSE BLOOD VI STRP: ORAL_STRIP | 2 refills | Status: DC

## 2018-12-30 NOTE — Addendum Note (Signed)
Addended by: Elyse Jarvis on: 12/30/2018 12:30 PM   Modules accepted: Orders

## 2018-12-30 NOTE — Progress Notes (Signed)
[name] is a 68 y.o. female who presents for annual wellness visit,CPE and follow-up on chronic medical conditions.  She states that she has had difficulty with shortness of breath for approximately 1 year mainly when she pushes herself with walking.  No PND.  She sleeps on 1 pillow.  No associated chest pain or diaphoresis.  She recognizes that she does not exercise at all.  She continues on metformin and Actos and is having no difficulty with that.  She does check her blood sugars before and after meals and they are in a good range.  Continues on atorvastatin without aches or pains.  Is also taking lisinopril/HCTZ and having no problems with that.  She does see her ophthalmologist regularly for treatment of her cataracts.  She does complain of some posterior right knee pain but no popping, locking or grinding.  Family and social history was reviewed.  She has been married for 50 years.  Immunizations and Health Maintenance Immunization History  Administered Date(s) Administered  . Influenza Split 06/30/2012, 08/02/2013  . Influenza Whole 06/27/2011  . Influenza, High Dose Seasonal PF 09/13/2015, 08/28/2016, 07/01/2017, 09/11/2018  . Influenza,inj,Quad PF,6+ Mos 06/10/2014  . Pneumococcal Conjugate-13 09/07/2014, 05/01/2017  . Pneumococcal Polysaccharide-23 02/28/2012  . Tdap 01/23/2007, 05/01/2017  . Zoster 01/23/2007  . Zoster Recombinat (Shingrix) 08/01/2017   Health Maintenance Due  Topic Date Due  . FOOT EXAM  05/01/2018    Last Pap smear:N/A Last mammogram:08-13-18 Last colonoscopy:01-04-14 Last DEXA:05-16-15 Dentist:Three weeks ago Ophtho:12-16-18 [place] Exercise:rare  Other doctors caring for patient include:  Advanced directives: No.  Information given.    Depression screen:  See questionnaire below.  Depression screen [facility] 2/9 12/30/2018 05/12/2018 12/26/2016 09/13/2015 06/30/2014  Decreased Interest 0 0 0 0 0  Down, Depressed, Hopeless 0 0 0 0 0  PHQ - 2 Score 0  0 0 0 0    Fall Risk Screen: see questionnaire below. Fall Risk  12/30/2018 05/22/2018 05/12/2018 12/26/2016 09/13/2015  Falls in the past year? 0 No No No No  Comment - Emmi Telephone Survey: data to providers prior to load - - -    ADL screen:  See questionnaire below Functional Status Survey: Is the patient deaf or have difficulty hearing?: No Does the patient have difficulty seeing, even when wearing glasses/contacts?: No Does the patient have difficulty concentrating, remembering, or making decisions?: No Does the patient have difficulty walking or climbing stairs?: No Does the patient have difficulty dressing or bathing?: No Does the patient have difficulty doing errands alone such as visiting a doctor's office or shopping?: No   Review of Systems Constitutional: -, -unexpected weight change, -anorexia, -fatigue Allergy: -sneezing, -itching, -congestion Dermatology: denies changing moles, rash, lumps ENT: -runny nose, -ear pain, -sore throat,  Cardiology:  -chest pain, -palpitations, -orthopnea, Respiratory: -cough, -shortness of breath, -dyspnea on exertion, -wheezing,  Gastroenterology: -abdominal pain, -nausea, -vomiting, -diarrhea, -constipation, -dysphagia Hematology: -bleeding or bruising problems Musculoskeletal: -arthralgias, -myalgias, -joint swelling, -back pain, - Ophthalmology: -vision changes,  Urology: -dysuria, -difficulty urinating,  -urinary frequency, -urgency, incontinence Neurology: -, -numbness, , -memory loss, -falls, -dizziness    PHYSICAL EXAM:  There were no vitals taken for this visit.  General Appearance: Alert, cooperative, no distress, appears stated age Head: Normocephalic, without obvious abnormality, atraumatic Eyes: PERRL, conjunctiva/corneas clear, EOM's intact, fundi benign Ears: Normal TM's and external ear canals Nose: Nares normal, mucosa normal, no drainage or sinus tenderness Throat: Lips, mucosa, and tongue normal; teeth and gums  normal Neck: Supple,  no lymphadenopathy;  thyroid:  no enlargement/tenderness/nodules; no carotid bruit or JVD Lungs: Clear to auscultation bilaterally without wheezes, rales or ronchi; respirations unlabored Heart: Regular rate and rhythm, S1 and S2 normal, no murmur, rubor gallop Abdomen: Soft, non-tender, nondistended, normoactive bowel sounds,  no masses, no hepatosplenomegaly Extremities: No clubbing, cyanosis or edema.  Diabetic foot exam is recorded and is normal Pulses: 2+ and symmetric all extremities Skin:  Skin color, texture, turgor normal, no rashes or lesions Lymph nodes: Cervical, supraclavicular, and axillary nodes normal Neurologic:  CNII-XII intact, normal strength, sensation and gait; reflexes 2+ and symmetric throughout Psych: Normal mood, affect, hygiene and grooming. A1c is 6.3 EKG is normal ASSESSMENT/PLAN: Routine general medical examination at a health care facility - Plan: POCT Urinalysis DIP (Proadvantage Device), EKG 12-Lead  Cataract associated with type 2 diabetes mellitus ([place])  Controlled type 2 diabetes mellitus with complication, without long-term current use of insulin ([place]) - Plan: EKG 12-Lead, VAS [place] ABI WITH/WO TBI  Hyperlipidemia associated with type 2 diabetes mellitus ([place])  Hypertension associated with diabetes ([place])  Morbid obesity ([place])    Recommended at least 30 minutes of aerobic activity at least 5 days/week and weight-bearing exercise 2x/week; ; healthy diet, including goals of calcium and vitamin D intake .  Immunization recommendations discussed.  Colonoscopy recommendations reviewed   Medicare Attestation I have personally reviewed: The patient's medical and social history Their use of alcohol, tobacco or illicit drugs Their current medications and supplements The patient's functional ability including ADLs,fall risks, home safety risks, cognitive, and hearing and visual impairment Diet and physical activities Evidence for  depression or mood disorders  The patient's weight, height, and BMI have been recorded in the chart.  I have made referrals, counseling, and provided education to the patient based on review of the above and I have provided the patient with a written personalized care plan for preventive services.     Jill Alexanders, MD   12/30/2018

## 2018-12-30 NOTE — Patient Instructions (Signed)
  Ms. Carollo , Thank you for taking time to come for your Medicare Wellness Visit. I appreciate your ongoing commitment to your health goals. Please review the following plan we discussed and let me know if I can assist you in the future.   These are the goals we discussed: Goals   None     This is a list of the screening recommended for you and due dates:  Health Maintenance  Topic Date Due  . Complete foot exam   05/01/2018  . Hemoglobin A1C  03/12/2019  . Eye exam for diabetics  12/17/2019  . Mammogram  08/13/2020  . Colon Cancer Screening  01/05/2024  . Tetanus Vaccine  05/02/2027  . Flu Shot  Completed  . DEXA scan (bone density measurement)  Completed  .  Hepatitis C: One time screening is recommended by Center for Disease Control  (CDC) for  adults born from [birth year] through 1966.   Completed  . Pneumonia vaccines  Discontinued

## 2019-01-06 ENCOUNTER — Other Ambulatory Visit: Payer: Self-pay

## 2019-01-07 ENCOUNTER — Other Ambulatory Visit: Payer: Self-pay

## 2019-01-07 ENCOUNTER — Ambulatory Visit (HOSPITAL_COMMUNITY)
Admission: RE | Admit: 2019-01-07 | Discharge: 2019-01-07 | Disposition: A | Discharge status: Home / Self Care | Payer: Medicare Other | Source: Ambulatory Visit | Attending: Family | Admitting: Family

## 2019-01-07 DIAGNOSIS — E118 Type 2 diabetes mellitus with unspecified complications: Secondary | ICD-10-CM | POA: Diagnosis not present

## 2019-03-18 ENCOUNTER — Other Ambulatory Visit: Payer: Self-pay | Admitting: Family Medicine

## 2019-03-18 DIAGNOSIS — E118 Type 2 diabetes mellitus with unspecified complications: Secondary | ICD-10-CM

## 2019-04-21 ENCOUNTER — Other Ambulatory Visit: Payer: Self-pay

## 2019-04-21 ENCOUNTER — Ambulatory Visit (INDEPENDENT_AMBULATORY_CARE_PROVIDER_SITE_OTHER): Payer: Medicare Other | Admitting: Psychiatry

## 2019-04-21 ENCOUNTER — Encounter: Payer: Self-pay | Admitting: Psychiatry

## 2019-04-21 VITALS — BP 161/80 | HR 83 | Ht 64.0 in | Wt 217.0 lb

## 2019-04-21 DIAGNOSIS — F4323 Adjustment disorder with mixed anxiety and depressed mood: Secondary | ICD-10-CM | POA: Diagnosis not present

## 2019-04-21 MED ORDER — SERTRALINE HCL 50 MG PO TABS: ORAL_TABLET | ORAL | 1 refills | Status: DC

## 2019-04-21 NOTE — Progress Notes (Signed)
[facility] MD/PA/NP Initial Note  04/23/2019 5:52 PM [name]  MRN:  [id number]  Chief Complaint:  Chief Complaint    Anxiety; Depression      HPI: Pt is a 68 yo female being seen for initial evaluation for anxiety and depressed mood. She reports that she is "easily upset, over hardly nothing." She reports difficulty coping "with the simplest things." Reports that she has been getting tearful more frequently. She reports that this has been happening over the last month and does not recall experiencing it in the past. Denies any past h/o significant depression or anxiety .  Denies persistent sad mood. Reports periods of sadness for a few hours or a day. She reports some irritability over the last month. She reports that she has been feeling anxious. She reports that she has experienced some heartburn and body aches with increased stress in the past. Reports some panic s/s in the last month with increased heart rate and shortness of breath and that this occurs at least once a week. She reports that she has some rumination about recent events and that these thoughts "will echo through my mind" for hours. Denies catastrophic thinking. Reports anxiety is triggered when her husband wants to talk about certain things and when he continues to talk about things she does not want to talk about. Denies social anxiety.   She reports that she likes things in a certain order or certain way. She reports that she will feel the need to straighten the house before leaving or going to bed and will leave dishes in the sink. Denies excessive routines or rituals. Denies excessive checking behaviors (will check that things are turned off because she is not sure).   She reports that they are in the process of buying a townhouse and leaving house of 38 years. She reports that husband "swung his chair round" and told her he had been looking at condominiums and had a realtor come to look at their house.   She reports  that sleep varies. Reports that she has nights where she sleeps well and other nights will experience repeated awakenings. Occasional difficulty falling asleep. Denies nightmares. Estimates sleeping about 7 hours in a 24-hour period (5-6 hours at night, plus a nap). She reports that her appetite has been decreased over the last couple of months and will eat "just because I fixed it." She reports that her weight has increased slightly and attributes this to snacking and less activity. She reports energy and motivation fluctuate depending on the day. Motivation has been lower over the last few months. She reports that concentration has been fluctuating. She reports increased difficulty with concentration and reading comprehension in the last few months. Reports that she has been experiencing diminished interest in things. Denies anhedonia.  Denies SI.  Denies any past manic s/s. Denies h/o AH, VH, or paranoia.   Born and raised in [place]. She has 3 surviving sisters out of 6 and she is the 2nd oldest. She reports that she grew up on a farm and reports that her home life was ok. Graduated HS. 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 Lived in [place] since 1976. Married x 50 years. Has 2 children, a son and a daughter. Son lives in [place] and has worked for [employer] since he was 21 yo. Daughter works in Mudlogger for [employer] at [employer]. Cancelled trip to visit daughter due to pandemic. She reports that she is active  in her church and has good social supports through her church. She reports that she will do crafts periodically. She reports that she has had limited time alone since the start of the pandemic since husband is no longer leaving the house for [employer]. Reports before he would go to meetings in the mornings and sometimes in the evenings.   Past Psychiatric Medication Trials: None   Visit Diagnosis:    ICD-10-CM   1. Adjustment disorder with mixed  anxiety and depressed mood  F43.23 sertraline (ZOLOFT) 50 MG tablet    Past Psychiatric History: Denies any past psychiatric hx to include therapy, med management, or hospitalization.   Past Medical History:  Past Medical History:  Diagnosis Date  . [facility]   . Diabetes mellitus   . Dyslipidemia   . Hyperlipidemia   . Hypertension   . Obesity   . Obesity     Past Surgical History:  Procedure Laterality Date  . ABDOMINAL HYSTERECTOMY    . fractured patella repair  02/2011   Dr. Theda Sers; R knee  . ORIF TIBIA FRACTURE    . ORIF TIBIA PLATEAU  10/12/2012   Procedure: OPEN REDUCTION INTERNAL FIXATION (ORIF) TIBIAL PLATEAU;  Surgeon: Rozanna Box, MD;  Location: [place];  Service: Orthopedics;  Laterality: Right;  . ORIF WRIST FRACTURE  11/03/2012   Procedure: OPEN REDUCTION INTERNAL FIXATION (ORIF) WRIST FRACTURE;  Surgeon: Rozanna Box, MD;  Location: [facility];  Service: Orthopedics;  Laterality: Left;  . TOTAL ABDOMINAL HYSTERECTOMY W/ BILATERAL SALPINGOOPHORECTOMY     fibroids    Family History:  Family History  Problem Relation Age of Onset  . Asthma Other   . Hypertension Other   . Diabetes Other     Social History:  Social History   Socioeconomic History  . Marital status: Married    Spouse name: Not on file  . Number of children: 2  . Years of education: Not on file  . Highest education level: Not on file  Occupational History  . Occupation: Personal assistant: [employer]  Social Needs  . Financial resource strain: Not on file  . Food insecurity    Worry: Not on file    Inability: Not on file  . Transportation needs    Medical: Not on file    Non-medical: Not on file  Tobacco Use  . Smoking status: Never Smoker  . Smokeless tobacco: Never Used  Substance and Sexual Activity  . Alcohol use: No  . Drug use: No  . Sexual activity: Yes  Lifestyle  . Physical activity    Days per week: Not on  file    Minutes per session: Not on file  . Stress: Not on file  Relationships  . Social Herbalist on phone: Not on file    Gets together: Not on file    Attends religious service: Not on file    Active member of club or organization: Not on file    Attends meetings of clubs or organizations: Not on file    Relationship status: Not on file  Other Topics Concern  . Not on file  Social History Narrative  . Not on file    Allergies: No Known Allergies  Metabolic Disorder Labs: Lab Results  Component Value Date   HGBA1C 6.3 (A) 12/30/2018   MPG 143 (H) 10/11/2012   No results found for: PROLACTIN Lab  Results  Component Value Date   CHOL 139 05/12/2018   TRIG 101 05/12/2018   HDL 51 05/12/2018   CHOLHDL 2.7 05/12/2018   VLDL 18 05/01/2017   LDLCALC 68 05/12/2018   LDLCALC 63 05/01/2017   No results found for: TSH  Therapeutic Level Labs: No results found for: LITHIUM No results found for: VALPROATE No components found for:  CBMZ  Current Medications: Current Outpatient Medications  Medication Sig Dispense Refill  . acetaminophen (TYLENOL) 325 MG tablet Take 325-650 mg by mouth every 6 (six) hours as needed. Reported on 04/26/2016    . aspirin EC 81 MG tablet Take 81 mg by mouth daily.    Marland Kitchen atorvastatin (LIPITOR) 10 MG tablet TAKE 1 TABLET(10 MG) BY MOUTH DAILY 90 tablet 3  . calcium carbonate (OS-CAL) 600 MG TABS Take 600 mg by mouth 2 (two) times daily with a meal.      . Cholecalciferol (VITAMIN D) 2000 UNITS tablet Take 2,000 Units by mouth daily.    Marland Kitchen KRILL OIL 1000 MG CAPS Take 1,000 mg by mouth every evening.    Marland Kitchen lisinopril-hydrochlorothiazide (PRINZIDE,ZESTORETIC) 20-12.5 MG tablet Take 1 tablet by mouth daily. 90 tablet 3  . loratadine (CLARITIN) 10 MG tablet Take 10 mg by mouth daily as needed for allergies.    . metFORMIN (GLUCOPHAGE) 500 MG tablet TAKE 1 TABLET(500 MG) BY MOUTH TWICE DAILY WITH A MEAL 180 tablet 0  . Multiple Vitamins-Minerals  (MULTIVITAMIN WITH MINERALS) tablet Take 1 tablet by mouth daily.     . Multiple Vitamins-Minerals (PRESERVISION AREDS 2+MULTI VIT PO) Take by mouth.    . pioglitazone (ACTOS) 30 MG tablet TAKE 1 TABLET(30 MG) BY MOUTH DAILY 90 tablet 1  . clobetasol (TEMOVATE) 0.05 % external solution APPLY TO SCALP DAILY.  2  . glucose blood (ONE TOUCH ULTRA TEST) test strip USE TO TEST ONCE A DAY AS DIRECTED 100 each 2  . Lancets (ONETOUCH ULTRASOFT) lancets USE AS DIRECTED TO TEST ONE TIME DAILY 200 each 0  . Misc Natural Products (COSAMIN ASU ADVANCED FORMULA) CAPS Take 2 each by mouth 2 (two) times daily.    . mupirocin ointment (BACTROBAN) 2 % Place 1 application into the nose 2 (two) times daily. 22 g 0  . neomycin-polymyxin-hydrocortisone (CORTISPORIN) 3.5-10000-1 OTIC suspension Place 3 drops into both ears 3 (three) times daily. 10 mL 0  . sertraline (ZOLOFT) 50 MG tablet Take 1/2 tab po qd x 2-4 days, then 1 tab po qd x 1 week, then 2 tabs po qd 60 tablet 1  . triamcinolone cream (KENALOG) 0.1 % Apply 1 application topically 2 (two) times daily. 30 g 0   No current facility-administered medications for this visit.     Medication Side Effects: N/A  Orders placed this visit:  No orders of the defined types were placed in this encounter.   Psychiatric Specialty Exam:  Review of Systems  Constitutional: Positive for malaise/fatigue.  HENT:       Dry mouth.   Eyes: Negative.   Respiratory: Positive for shortness of breath.   Cardiovascular: Positive for leg swelling.  Gastrointestinal: Negative.   Genitourinary: Positive for urgency.  Musculoskeletal: Positive for back pain and joint pain.  Skin: Negative.   Neurological: Positive for headaches.  Endo/Heme/Allergies: Positive for polydipsia.       Reports that nails are more thin and brittle  Psychiatric/Behavioral:       Please refer to HPI    Blood pressure (!) 161/80, pulse 83,  height 5\' 4"  (1.626 m), weight 217 lb (98.4 kg).Body mass  index is 37.25 kg/m.  General Appearance: Casual  Eye Contact:  Good  Speech:  Clear and Coherent and Normal Rate  Volume:  Normal  Mood:  Anxious and Depressed  Affect:  Tearful  Thought Process:  Coherent  Orientation:  Full (Time, Place, and Person)  Thought Content: Logical   Suicidal Thoughts:  No  Homicidal Thoughts:  No  Memory:  WNL  Judgement:  Good  Insight:  Fair  Psychomotor Activity:  Normal  Concentration:  Concentration: Good  Recall:  Good  Fund of Knowledge: Good  Language: Good  Assets:  Communication Skills Resilience  ADL's:  Intact  Cognition: WNL  Prognosis:  Good   Screenings:  PHQ2-9     Office Visit from 12/30/2018 in [facility] Visit from 05/12/2018 in [place] [facility] Visit from 12/26/2016 in [facility] Visit from 09/13/2015 in [facility]  PHQ-2 Total Score  0  0  0  0  0      Receiving Psychotherapy: No   Treatment Plan/Recommendations: Pt seen for 60 minutes and greater than 50 % of visit spent counseling pt re: mood and anxiety s/s. Discussed proposed mechanism of action of SSRI's. Discussed potential benefits, risks, and side effects of Sertraline with pt. Pt agrees to trial of Sertraline. Will start Sertraline 25 mg po qd x 2-4 days, then 50 mg po qd for one week, then 100 mg po qd for anxiety and depression. Discussed potential benefits of therapy and that individual and couples counseling may be helpful. Pt amenable to starting therapy and was assisted with scheduling an apt with Luan Moore, PhD. Pt to f/u with this provider in 4-6 weeks or sooner if clinically indicated. Patient advised to contact office with any questions, adverse effects, or acute worsening in signs and symptoms.     Thayer Headings, PMHNP

## 2019-04-28 ENCOUNTER — Encounter: Payer: Self-pay | Admitting: Family Medicine

## 2019-04-29 ENCOUNTER — Other Ambulatory Visit: Payer: Self-pay

## 2019-04-29 ENCOUNTER — Ambulatory Visit (INDEPENDENT_AMBULATORY_CARE_PROVIDER_SITE_OTHER): Payer: Medicare Other | Admitting: Psychiatry

## 2019-04-29 DIAGNOSIS — F4323 Adjustment disorder with mixed anxiety and depressed mood: Secondary | ICD-10-CM

## 2019-04-29 NOTE — Progress Notes (Signed)
PROBLEM-FOCUSED INITIAL PSYCHOTHERAPY EVALUATION Luan Moore, PhD LP [facility]  Name: [name] Date: 04/29/2019 Time spent: 55 min MRN: [id number] DOB: 14-Jan-1951 Guardian/Payee: self  PCP: Denita Lung, MD Documentation requested on this visit: No  PROBLEM HISTORY Reason for Visit /Presenting Problem:  Chief Complaint  Patient presents with  . Establish Care  . Depression   Narrative/History of Present Illness Referred by Thayer Headings, NP for depression, crying spells, sensitive reactions to stress, conflict with husband.  See her thorough assessment from last week.  PT reports early benefit of 1st week on Zoloft.  Concerns also about finding things, discovering she has misplaced things more often.  Associated with degraded sleep, c/o difficulty falling asleep, middle waking, often with sense of not having finished what she needed to before turning in.  Sleep typically up 6:30a, bedtime c. MN, consistent with working days, though she has been retired a few years.    Trigger event with husband Pilar Plate, in 32-year alcohol recovery, dropped a bombshell April that he was looking for condo/townhouse for them and having a realtor come in the next day.  38 years in this house, had seen it as their forever home, so grieved to lose it, and jolted to be informed and moving on it so quickly.  Has moved now (July 3), working through the practicalities of refitting the space, have had to turn loose of a number of things and see Frank's rosy assumptions come down on how much could fit in the new place.  [facility] has been able to take much.  Working out Chief Financial Officer for interests, some competition for space in the office/workroom for his computer time and her sewing.    Personality tends to put too much on herself.  Working on breaking down expectations to fit.  Prior Psychiatric Assessment/Treatment:   Outpatient treatment history: Years ago, with husband History of  psychiatric hospitalization: none stated History of psychological assessment/testing: none stated   Abuse History: Victim of abuse: Not assessed at this time / none suspected.   Victim of neglect: Not assessed at this time / none suspected.   Perpetrator of abuse/neglect: Not assessed at this time / none suspected.   Witness / Exposure to Domestic Violence: Not assessed at this time / none suspected.   Witness to Community Violence:  Not assessed at this time / none suspected.   Protective Services Involvement: No.   Report needed: No.    Substance Abuse History: Current substance abuse: Not assessed at this time / none suspected.   History of impactful substance use/abuse: Not assessed at this time / none suspected.     FAMILY/SOCIAL HISTORY Family of origin -- Not assessed Current family -- PT lives with their spouse, with living conditions reported as good, in transition.   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  More typically 45-60 hrs/wk.  Enjoyed well, though subject to market and sales conditions.  Was a Regulatory affairs officer earlier, temporarily employed with another company, let go on false pretenses, before [employer] for 18 years.   [place] History of [employer] -- Not assessed at this time / none suspected.   Spiritually -- Protestant.  Practicing: Yes Enjoyable activities -- sewing.  Other situational factors affecting treatment and prognosis: Stressors from the following areas: none stated Barriers to service: none stated  Notable cultural sensitivities: none  stated Strengths: Family and Spirituality   MED/SURG HISTORY Med/surg history was not reviewed with PT at this time. Past Medical History:  Diagnosis Date  . [facility]al [facility]   . Diabetes mellitus   . Dyslipidemia   .  Hyperlipidemia   . Hypertension   . Obesity   . Obesity      Past Surgical History:  Procedure Laterality Date  . ABDOMINAL HYSTERECTOMY    . fractured patella repair  02/2011   Dr. Theda Sers; R knee  . ORIF TIBIA FRACTURE    . ORIF TIBIA PLATEAU  10/12/2012   Procedure: OPEN REDUCTION INTERNAL FIXATION (ORIF) TIBIAL PLATEAU;  Surgeon: Rozanna Box, MD;  Location: [facility];  Service: Orthopedics;  Laterality: Right;  . ORIF WRIST FRACTURE  11/03/2012   Procedure: OPEN REDUCTION INTERNAL FIXATION (ORIF) WRIST FRACTURE;  Surgeon: Rozanna Box, MD;  Location: [facility];  Service: Orthopedics;  Laterality: Left;  . TOTAL ABDOMINAL HYSTERECTOMY W/ BILATERAL SALPINGOOPHORECTOMY     fibroids    No Known Allergies  Medications (as listed in Epic): Current Outpatient Medications  Medication Sig Dispense Refill  . acetaminophen (TYLENOL) 325 MG tablet Take 325-650 mg by mouth every 6 (six) hours as needed. Reported on 04/26/2016    . aspirin EC 81 MG tablet Take 81 mg by mouth daily.    Marland Kitchen atorvastatin (LIPITOR) 10 MG tablet TAKE 1 TABLET(10 MG) BY MOUTH DAILY 90 tablet 3  . calcium carbonate (OS-CAL) 600 MG TABS Take 600 mg by mouth 2 (two) times daily with a meal.      . Cholecalciferol (VITAMIN D) 2000 UNITS tablet Take 2,000 Units by mouth daily.    . clobetasol (TEMOVATE) 0.05 % external solution APPLY TO SCALP DAILY.  2  . glucose blood (ONE TOUCH ULTRA TEST) test strip USE TO TEST ONCE A DAY AS DIRECTED 100 each 2  . KRILL OIL 1000 MG CAPS Take 1,000 mg by mouth every evening.    . Lancets (ONETOUCH ULTRASOFT) lancets USE AS DIRECTED TO TEST ONE TIME DAILY 200 each 0  . lisinopril-hydrochlorothiazide (ZESTORETIC) 20-12.5 MG tablet TAKE 1 TABLET BY MOUTH DAILY 90 tablet 0  . loratadine (CLARITIN) 10 MG tablet Take 10 mg by mouth daily as needed for allergies.    . metFORMIN (GLUCOPHAGE) 500 MG tablet TAKE 1 TABLET(500 MG) BY MOUTH TWICE DAILY WITH A MEAL 180 tablet 0  . Misc Natural Products  (COSAMIN ASU ADVANCED FORMULA) CAPS Take 2 each by mouth 2 (two) times daily.    . Multiple Vitamins-Minerals (MULTIVITAMIN WITH MINERALS) tablet Take 1 tablet by mouth daily.     . Multiple Vitamins-Minerals (PRESERVISION AREDS 2+MULTI VIT PO) Take by mouth.    . mupirocin ointment (BACTROBAN) 2 % Place 1 application into the nose 2 (two) times daily. 22 g 0  . neomycin-polymyxin-hydrocortisone (CORTISPORIN) 3.5-10000-1 OTIC suspension Place 3 drops into both ears 3 (three) times daily. 10 mL 0  . pioglitazone (ACTOS) 30 MG tablet TAKE 1 TABLET(30 MG) BY MOUTH DAILY 90 tablet 0  . sertraline (ZOLOFT) 50 MG tablet Take 1/2 tab po qd x 2-4 days, then 1 tab po qd x 1 week, then 2 tabs po qd 60 tablet 1  . sulfamethoxazole-trimethoprim (BACTRIM DS) 800-160 MG tablet Take 1 tablet by mouth 2 (two) times daily. 20 tablet 0  . triamcinolone cream (KENALOG) 0.1 % Apply 1 application topically 2 (two) times daily. 30 g 0  No current facility-administered medications for this visit.     MENTAL STATUS AND OBSERVATIONS Appearance:   Casual     Behavior:  Appropriate  Motor:  Normal  Speech/Language:   Clear and Coherent  Affect:  Appropriate and Restricted, responsive to support, inquiry  Mood:  sad  Thought process:  normal  Thought content:    WNL  Sensory/Perceptual disturbances:    WNL  Orientation:  grossly intact  Attention:  Good  Concentration:  Good  Memory:  grossly intact on observation  Fund of knowledge:   Good  Insight:    Good  Judgment:   Good  Impulse Control:  Good  Initial Risk Assessment: Danger to Self: No Self-injurious Behavior: No Danger to Others: No Physical Aggression / Violence: No Duty to Warn: No Access to Firearms a concern: No Gang Involvement: No Patient / guardian was educated about steps to take if suicide or homicide risk level increases between visits: yes . While future psychiatric events cannot be accurately predicted, the patient does not currently  require acute inpatient psychiatric care and does not currently meet [facility] involuntary commitment criteria.   DIAGNOSIS:    ICD-10-CM   1. Adjustment disorder with mixed anxiety and depressed mood  F43.23     INITIAL TREATMENT: . Ethical orientation and verbal consents to o privacy rights including, but not limited to, HIPAA provisions and any questions, EMR and use of e-PHI o patient responsibilities, including scheduling and fair notice of changes, method of visit options and regulatory and financial conditions affecting them o expectations for working relationship in psychotherapy o expectations and consents for working partnerships with other health care disciplines, especially including medication and other behavioral health providers . Affirmed right to grieve, normal feelings associated with uprooting and with being shown lack of control . Oriented to sleep quality and circadian rhythm.  Coached in accepting the day's efforts and declaring good enough.  . Oriented to tactics for improving sleep . Encouraged in communication with husband and working through the adjustment to new home  Plan: . Seek earlier bedtime if possible . Option to dim lights more, earlier, to help circadian rhythm . Confer, if needed, with husband about making unilateral decisions . Maintain medication as prescribed and work faithfully with relevant prescriber(s) if any changes are desired or seem indicated . Call the clinic on-call service, present to ER, or call 911 if any life-threatening psychiatric crisis Return in about 2 weeks (around 05/13/2019) for may schedule ahead, est. 2 visits.  Blanchie Serve, PhD  Luan Moore, PhD LP Clinical Psychologist, [facility] [address] [place], [place] [id number] [phone]

## 2019-05-01 ENCOUNTER — Encounter: Payer: Self-pay | Admitting: Family Medicine

## 2019-05-01 ENCOUNTER — Ambulatory Visit (INDEPENDENT_AMBULATORY_CARE_PROVIDER_SITE_OTHER): Payer: Medicare Other | Admitting: Family Medicine

## 2019-05-01 ENCOUNTER — Other Ambulatory Visit: Payer: Self-pay

## 2019-05-01 VITALS — BP 126/80 | HR 67 | Temp 98.2°F | Wt 216.6 lb

## 2019-05-01 DIAGNOSIS — N3000 Acute cystitis without hematuria: Secondary | ICD-10-CM

## 2019-05-01 DIAGNOSIS — N8110 Cystocele, unspecified: Secondary | ICD-10-CM | POA: Diagnosis not present

## 2019-05-01 LAB — POCT URINALYSIS DIP (PROADVANTAGE DEVICE)
Bilirubin, UA: NEGATIVE
Glucose, UA: NEGATIVE mg/dL
Nitrite, UA: NEGATIVE
Protein Ur, POC: 30 mg/dL — AB
Specific Gravity, Urine: 1.015
Urobilinogen, Ur: 0.2
pH, UA: 6.5 (ref 5.0–8.0)

## 2019-05-01 MED ORDER — SULFAMETHOXAZOLE-TRIMETHOPRIM 800-160 MG PO TABS: 1.0000 | ORAL_TABLET | Freq: Two times a day (BID) | ORAL | 0 refills | Status: DC

## 2019-05-01 NOTE — Progress Notes (Signed)
   Subjective:    Patient ID: [name], female    DOB: 12/23/1950, 68 y.o.   MRN: [id number]  HPI She complains of urinary frequency, dysuria, urgency and feeling as if her bladder is protruding.  No fever, chills or abdominal pain.   Review of Systems     Objective:   Physical Exam Alert and in no distress vaginal exam shows a slight cystocele when asked to perform the Valsalva.  It does not protrude past the labia. Urine microscopic was negative for blood.       Assessment & Plan:  Acute cystitis without hematuria - Plan: sulfamethoxazole-trimethoprim (BACTRIM DS) 800-160 MG tablet, POCT Urinalysis DIP (Proadvantage Device),   Female cystocele - Plan: Ambulatory referral to Gynecology,

## 2019-05-05 ENCOUNTER — Ambulatory Visit: Payer: Medicare Other | Admitting: Family Medicine

## 2019-05-06 ENCOUNTER — Other Ambulatory Visit: Payer: Self-pay

## 2019-05-06 ENCOUNTER — Encounter: Payer: Self-pay | Admitting: Obstetrics and Gynecology

## 2019-05-06 ENCOUNTER — Ambulatory Visit (INDEPENDENT_AMBULATORY_CARE_PROVIDER_SITE_OTHER): Payer: Medicare Other | Admitting: Obstetrics and Gynecology

## 2019-05-06 VITALS — BP 114/62 | HR 70 | Temp 98.9°F | Resp 14 | Ht 63.0 in | Wt 216.5 lb

## 2019-05-06 DIAGNOSIS — N8111 Cystocele, midline: Secondary | ICD-10-CM

## 2019-05-06 DIAGNOSIS — N819 Female genital prolapse, unspecified: Secondary | ICD-10-CM

## 2019-05-06 DIAGNOSIS — N952 Postmenopausal atrophic vaginitis: Secondary | ICD-10-CM

## 2019-05-06 NOTE — Patient Instructions (Signed)

## 2019-05-06 NOTE — Progress Notes (Signed)
68 y.o. No obstetric history on file. Married White or Caucasian Not Hispanic or Latino female here for new patient visit for cystocele, referred by Dr Redmond School. H/O TAH/BSO years ago.  She has had some issues with urge incontinence in the last few weeks. Primary started her on antibiotics for an infection. She noticed a vaginal bulge intermittently since the end of June. When she started antibiotics for the UTI the symptoms improved, now just notices it occasionally. Feels like she voids normally and empties normally, normal stream.  Occasional constipation, most of the time is okay. Occasionally will push on her vagina to defecate.  H/O 2 NSVD, largest was 8 lbs, 13 oz.   Sexually active, no pain. No vaginal bleeding. Has never been told she had prolapse in the past.   H/O DM, HgbA1C was 6.3 in 3/20      No LMP recorded. Patient has had a hysterectomy.          Sexually active: Yes.    The current method of family planning is status post hysterectomy.    Exercising: No.  The patient does not participate in regular exercise at present. Smoker:  no  Health Maintenance: Pap:  Prior to hysterectomy in 1994  History of abnormal Pap:  no MMG:  08-13-18 density B/BIRADS 1 negative  BMD:   05-16-15 osteopenia  Colonoscopy: 01-04-14 hemorrhoids, otherwise normal- f/u 10 years  TDaP:  05-01-17 Gardasil: no   reports that she has never smoked. She has never used smokeless tobacco. She reports that she does not drink alcohol or use drugs. 2 kids, 2 grand children.   Past Medical History:  Diagnosis Date  . Basal cell carcinoma of nose 11/30/13   [facility]   . Diabetes mellitus   . Dyslipidemia   . Hyperlipidemia   . Hypertension   . Obesity   . Obesity     Past Surgical History:  Procedure Laterality Date  . ABDOMINAL HYSTERECTOMY    . fractured patella repair  02/2011   Dr. Theda Sers; R knee  . ORIF TIBIA FRACTURE    . ORIF TIBIA PLATEAU  10/12/2012   Procedure: OPEN  REDUCTION INTERNAL FIXATION (ORIF) TIBIAL PLATEAU;  Surgeon: Rozanna Box, MD;  Location: [place];  Service: Orthopedics;  Laterality: Right;  . ORIF WRIST FRACTURE  11/03/2012   Procedure: OPEN REDUCTION INTERNAL FIXATION (ORIF) WRIST FRACTURE;  Surgeon: Rozanna Box, MD;  Location: [place];  Service: Orthopedics;  Laterality: Left;  . TOTAL ABDOMINAL HYSTERECTOMY W/ BILATERAL SALPINGOOPHORECTOMY     fibroids    Current Outpatient Medications  Medication Sig Dispense Refill  . acetaminophen (TYLENOL) 325 MG tablet Take 325-650 mg by mouth every 6 (six) hours as needed. Reported on 04/26/2016    . aspirin EC 81 MG tablet Take 81 mg by mouth daily.    Marland Kitchen atorvastatin (LIPITOR) 10 MG tablet TAKE 1 TABLET(10 MG) BY MOUTH DAILY 90 tablet 3  . calcium carbonate (OS-CAL) 600 MG TABS Take 600 mg by mouth 2 (two) times daily with a meal.      . Cholecalciferol (VITAMIN D) 2000 UNITS tablet Take 2,000 Units by mouth daily.    . clobetasol (TEMOVATE) 0.05 % external solution APPLY TO SCALP DAILY.  2  . glucose blood (ONE TOUCH ULTRA TEST) test strip USE TO TEST ONCE A DAY AS DIRECTED 100 each 2  . KRILL OIL 1000 MG CAPS Take 1,000 mg by mouth every evening.    . Lancets (  ONETOUCH ULTRASOFT) lancets USE AS DIRECTED TO TEST ONE TIME DAILY 200 each 0  . lisinopril-hydrochlorothiazide (PRINZIDE,ZESTORETIC) 20-12.5 MG tablet Take 1 tablet by mouth daily. 90 tablet 3  . loratadine (CLARITIN) 10 MG tablet Take 10 mg by mouth daily as needed for allergies.    . metFORMIN (GLUCOPHAGE) 500 MG tablet TAKE 1 TABLET(500 MG) BY MOUTH TWICE DAILY WITH A MEAL 180 tablet 0  . Misc Natural Products (COSAMIN ASU ADVANCED FORMULA) CAPS Take 2 each by mouth 2 (two) times daily.    . Multiple Vitamins-Minerals (MULTIVITAMIN WITH MINERALS) tablet Take 1 tablet by mouth daily.     . Multiple Vitamins-Minerals (PRESERVISION AREDS 2+MULTI VIT PO) Take by mouth.    . mupirocin ointment (BACTROBAN) 2 % Place 1 application into the  nose 2 (two) times daily. 22 g 0  . neomycin-polymyxin-hydrocortisone (CORTISPORIN) 3.5-10000-1 OTIC suspension Place 3 drops into both ears 3 (three) times daily. 10 mL 0  . pioglitazone (ACTOS) 30 MG tablet TAKE 1 TABLET(30 MG) BY MOUTH DAILY 90 tablet 1  . sertraline (ZOLOFT) 50 MG tablet Take 1/2 tab po qd x 2-4 days, then 1 tab po qd x 1 week, then 2 tabs po qd 60 tablet 1  . sulfamethoxazole-trimethoprim (BACTRIM DS) 800-160 MG tablet Take 1 tablet by mouth 2 (two) times daily. 20 tablet 0  . triamcinolone cream (KENALOG) 0.1 % Apply 1 application topically 2 (two) times daily. 30 g 0   No current facility-administered medications for this visit.     Family History  Problem Relation Age of Onset  . Asthma Other   . Hypertension Other   . Diabetes Other     Review of Systems  Constitutional: Negative.   HENT: Negative.   Eyes: Negative.   Respiratory: Negative.   Cardiovascular: Negative.   Gastrointestinal: Negative.   Endocrine: Negative.   Genitourinary: Negative.   Musculoskeletal: Negative.   Skin: Negative.   Allergic/Immunologic: Negative.   Neurological: Negative.   Hematological: Negative.   Psychiatric/Behavioral: Negative.     Exam:   BP 114/62 (BP Location: Left Arm, Patient Position: Sitting, Cuff Size: Large)   Pulse 70   Temp 98.9 F (37.2 C) (Temporal)   Resp 14   Ht 5\' 3"  (1.6 m)   Wt 216 lb 8 oz (98.2 kg)   BMI 38.35 kg/m   Weight change: @WEIGHTCHANGE @ Height:   Height: 5\' 3"  (160 cm)  Ht Readings from Last 3 Encounters:  05/06/19 5\' 3"  (1.6 m)  12/30/18 5\' 4"  (1.626 m)  05/12/18 5\' 3"  (1.6 m)    General appearance: alert, cooperative and appears stated age Head: Normocephalic, without obvious abnormality, atraumatic Neck: no adenopathy, supple, symmetrical, trachea midline and thyroid normal to inspection and palpation Lungs: clear to auscultation bilaterally Cardiovascular: regular rate and rhythm Abdomen: soft, non-tender; non  distended,  no masses,  no organomegaly Extremities: extremities normal, atraumatic, no cyanosis or edema Skin: Skin color, texture, turgor normal. Some erythema in her umbilicus, under her panus and in her groin (states she has medication for it) Neurologic: Grossly normal   Pelvic: External genitalia:  no lesions              Urethra:  normal appearing urethra with no masses, tenderness or lesions              Bartholins and Skenes: normal                 Vagina: atrophic appearing vagina with normal color  and discharge, no lesions. Grade 2 cystocele, grade one vault prolapse, no significant rectocele. Patient examined supine and standing, with and without valsalva              Cervix: absent               Bimanual Exam:  Uterus:  uterus absent              Adnexa: no mass, fullness, tenderness               Rectovaginal: deferred  Chaperone was present for exam.  A:  Grade 2 cystocele  Symptoms only present for the last 3 weeks and have improved since starting treatment for a UTI.   Urge incontinence has improved with treatment of UTI  Vaginal atrophy, dryness can contribute to being aware of the prolapse.   P:   Discussed cystocele  Information given  She only needs to treat her cystocele if it's bothering her  Reviewed use of a pessary  Would not recommend surgery at this time  Discussed trying a vaginal moisturizer   Call if she is having difficulty voiding or recurrent incontinence   ~30 minutes spent face to face, over 50% in counseling  CC: Dr Redmond School

## 2019-05-08 ENCOUNTER — Other Ambulatory Visit: Payer: Self-pay | Admitting: Family Medicine

## 2019-05-08 DIAGNOSIS — E118 Type 2 diabetes mellitus with unspecified complications: Secondary | ICD-10-CM

## 2019-05-08 DIAGNOSIS — E1159 Type 2 diabetes mellitus with other circulatory complications: Secondary | ICD-10-CM

## 2019-05-11 ENCOUNTER — Encounter: Payer: Self-pay | Admitting: Family Medicine

## 2019-05-11 ENCOUNTER — Telehealth: Payer: Self-pay

## 2019-05-11 ENCOUNTER — Other Ambulatory Visit: Payer: Self-pay

## 2019-05-11 ENCOUNTER — Ambulatory Visit (INDEPENDENT_AMBULATORY_CARE_PROVIDER_SITE_OTHER): Payer: Medicare Other | Admitting: Family Medicine

## 2019-05-11 VITALS — Wt 216.0 lb

## 2019-05-11 DIAGNOSIS — E118 Type 2 diabetes mellitus with unspecified complications: Secondary | ICD-10-CM

## 2019-05-11 DIAGNOSIS — F32A Depression, unspecified: Secondary | ICD-10-CM

## 2019-05-11 DIAGNOSIS — E1169 Type 2 diabetes mellitus with other specified complication: Secondary | ICD-10-CM | POA: Diagnosis not present

## 2019-05-11 DIAGNOSIS — F325 Major depressive disorder, single episode, in full remission: Secondary | ICD-10-CM | POA: Insufficient documentation

## 2019-05-11 DIAGNOSIS — J301 Allergic rhinitis due to pollen: Secondary | ICD-10-CM

## 2019-05-11 DIAGNOSIS — H40039 Anatomical narrow angle, unspecified eye: Secondary | ICD-10-CM

## 2019-05-11 DIAGNOSIS — F419 Anxiety disorder, unspecified: Secondary | ICD-10-CM | POA: Insufficient documentation

## 2019-05-11 DIAGNOSIS — F329 Major depressive disorder, single episode, unspecified: Secondary | ICD-10-CM

## 2019-05-11 DIAGNOSIS — I152 Hypertension secondary to endocrine disorders: Secondary | ICD-10-CM

## 2019-05-11 DIAGNOSIS — E1159 Type 2 diabetes mellitus with other circulatory complications: Secondary | ICD-10-CM | POA: Diagnosis not present

## 2019-05-11 DIAGNOSIS — E785 Hyperlipidemia, unspecified: Secondary | ICD-10-CM

## 2019-05-11 DIAGNOSIS — I10 Essential (primary) hypertension: Secondary | ICD-10-CM

## 2019-05-11 DIAGNOSIS — Z9849 Cataract extraction status, unspecified eye: Secondary | ICD-10-CM

## 2019-05-11 NOTE — Progress Notes (Signed)
Subjective:    Patient ID: [name], female    DOB: 1951-06-26, 68 y.o.   MRN: [id number]  Documentation for virtual telephone encounter. Documentation for virtual audio and video telecommunications through [place]  encounter: The patient was located at home. The provider was located in the office. The patient did consent to this visit and is aware of possible charges through their insurance for this visit. The other persons participating in this telemedicine service were none. Time spent on call was 5 minutes and in review of previous records >22 minutes total. This virtual service is not related to other E/M service within previous 7 days. [name] is a 68 y.o. female who presents for follow-up of Type 2 diabetes mellitus.  Patient is checking home blood sugars.   Home blood sugar records:pt keeps a log  How often is blood sugars being checked: QD fasting and after meal not sure if they are two hours 109-157 Current symptoms/problems include None at this time. Daily foot checks: yes   Any foot concerns: no Last eye exam: 11/2018.  She has had bilateral cataract surgery Exercise: staying active they are in the process of moving into a townhouse and have been kept very busy over the last month or 2. She continues on metformin as well as Actos for her diabetes.  She is taking Lipitor and having no difficulty with that.  Also on lisinopril/HCTZ.  She is now involved in counseling with Thayer Headings and presently is taking Zoloft.  This seems to be helping her and she plans to continue in counseling.  Her allergies are not causing any trouble otherwise she has no concerns. The following portions of the patient's history were reviewed and updated as appropriate: allergies, current medications, past medical history, past social history and problem list.  ROS as in subjective above.     Objective:    Physical Exam Alert and in no distress otherwise not examined.   Lab Review  Diabetic Labs Latest Ref Rng & Units 12/30/2018 09/11/2018 05/12/2018 01/06/2018 09/03/2017  HbA1c 4.0 - 5.6 % 6.3(A) 6.4(A) 6.2(A) 6.1 6.2  Microalbumin mg/L - - 13.0 - -  Micro/Creat Ratio - - - 8.3 - -  Chol 100 - 199 mg/dL - - 139 - -  HDL >39 mg/dL - - 51 - -  Calc LDL 0 - 99 mg/dL - - 68 - -  Triglycerides 0 - 149 mg/dL - - 101 - -  Creatinine 0.57 - 1.00 mg/dL - - 0.77 - -   BP/Weight 05/06/2019 05/01/2019 12/30/2018 09/11/2018 01/28/8118  Systolic BP 147 829 562 130 865  Diastolic BP 62 80 82 80 68  Wt. (Lbs) 216.5 216.6 225 226.2 220  BMI 38.35 37.18 38.62 40.07 38.97  Some encounter information is confidential and restricted. Go to Review Flowsheets activity to see all data.   Foot/eye exam completion dates Latest Ref Rng & Units 12/30/2018 12/16/2018  Eye Exam No Retinopathy - No Retinopathy  Foot Form Completion - Done -    [name]  reports that she has never smoked. She has never used smokeless tobacco. She reports that she does not drink alcohol or use drugs.     Assessment & Plan:     Encounter Diagnoses  Name Primary?  . Controlled type 2 diabetes mellitus with complication, without long-term current use of insulin ([place]) Yes  . Hypertension associated with diabetes ([place])   . Hyperlipidemia associated with type 2 diabetes mellitus ([place])   . Morbid  obesity ([place])   . Seasonal allergic rhinitis due to pollen   . Borderline glaucoma with anatomical narrow angle, unspecified laterality   . Status post cataract extraction, unspecified laterality      1. Rx changes: none 2. Education: Reviewed 'ABCs' of diabetes management (respective goals in parentheses):  A1C (<7), blood pressure (<130/80), and cholesterol (LDL <100). 3. Compliance at present is estimated to be good. Efforts to improve compliance (if necessary) will be directed at increased exercise.  Recommended 20 minutes of something physical daily. 4. Follow up: 4 months Pressure to continue with the Zoloft and in  counseling.

## 2019-05-11 NOTE — Telephone Encounter (Signed)
ERROR pt called back during message. [place]

## 2019-05-12 ENCOUNTER — Telehealth: Payer: Self-pay

## 2019-05-12 NOTE — Telephone Encounter (Signed)
Got a message yesterday saying that [name] due to them not taking pt. Pt advised that they went ahead and scheduled an appointment and she was seen on the 15th of July. [place]

## 2019-05-13 ENCOUNTER — Other Ambulatory Visit: Payer: Self-pay

## 2019-05-13 ENCOUNTER — Ambulatory Visit (INDEPENDENT_AMBULATORY_CARE_PROVIDER_SITE_OTHER): Payer: Medicare Other | Admitting: Psychiatry

## 2019-05-13 DIAGNOSIS — F4323 Adjustment disorder with mixed anxiety and depressed mood: Secondary | ICD-10-CM

## 2019-05-13 NOTE — Progress Notes (Signed)
Psychotherapy Progress Note [facility] Luan Moore, PhD LP  Patient ID: REGANA KEMPLE     MRN: [id number]     Therapy format: Individual psychotherapy Date: 05/13/2019     Start: 9:13a Stop: 9:52a Time Spent: 39 min Location: in-person   Session narrative (presenting needs, interim history, self-report of stressors and symptoms, applications of prior therapy, status changes, and interventions made in session) Going pretty good, getting things into place.  Delays upfitting the bathroom.  Only one episode past two weeks of getting upset with something not working as hoped (glucometer).  Not particularly grieving the old house at this point, sees the possibilities in this one, and feels fully reconciled with husband about the decision-making that led to it.  All practical concerns at this point.  Sees possibilities to use better space in the new home.  Asserts that she and husband are clear at this point about making unilateral decisions, feels fully respected at this point.  Sleeping better, cut back TV and computer time in evening, sleeping as soundly as she means to.    Pleased to see husband come back to church, after a resentful episode feeling displaced from two roles maintaining things at church.  Glad to be unified, husband does know she appreciates him forgiving and rejoining after issues and perceptions led him to negativity and the desire to quit.  Re. medication, is only 3 weeks on it, but feels good about it, checking in 2 weeks.  Discussed possible lack of need for it as situation, adjustment, and lifestyle settle.  Encouraged to maintain sleep measures, and just let Ms. Eulas Post know if it seems to work too well or become unnecessary.  Re. relapse prevention, asked how she would counsel herself if she could go back a month -- tell herself things don't have to be as bad as they seem, she'll figure out where things are, things will find their places soon enough, take  it a day at a time, it's temporary.  Discussed going as-needed.  Feels competent to manage pandemic conditions.    Therapeutic modalities: Cognitive Behavioral Therapy and Solution-Oriented/Positive Psychology  Mental Status/Observations:  Appearance:   Casual     Behavior:  Appropriate  Motor:  Normal  Speech/Language:   Clear and Coherent  Affect:  Appropriate  Mood:  normal  Thought process:  normal  Thought content:    WNL  Sensory/Perceptual disturbances:    WNL  Orientation:  grossly intact  Attention:  Good  Concentration:  Good  Memory:  WNL  Insight:    Good  Judgment:   Good  Impulse Control:  Good   Risk Assessment: Danger to Self: No Self-injurious Behavior: No Danger to Others: No Physical Aggression / Violence: No Duty to Warn: No Access to Firearms a concern: No  Assessment of progress:  progressing well  Diagnosis:   ICD-10-CM   1. Adjustment disorder with mixed anxiety and depressed mood  F43.23    greatly improved    Plan:  . Maintain open communication  . Maintain sleep hygiene measures . Other recommendations/advice as noted above . Continue to utilize previously learned skills ad lib . Maintain medication as prescribed and work faithfully with relevant prescriber(s) if any changes are desired or seem indicated . Call the clinic on-call service, present to ER, or call 911 if any life-threatening psychiatric crisis Return if symptoms worsen or fail to improve.   Blanchie Serve, PhD Luan Moore, PhD LP Clinical Psychologist, [facility]  Group [facility] [address] [place], [place] [address] [phone]

## 2019-05-26 ENCOUNTER — Ambulatory Visit (INDEPENDENT_AMBULATORY_CARE_PROVIDER_SITE_OTHER): Payer: Medicare Other | Admitting: Psychiatry

## 2019-05-26 ENCOUNTER — Other Ambulatory Visit: Payer: Self-pay

## 2019-05-26 ENCOUNTER — Encounter: Payer: Self-pay | Admitting: Psychiatry

## 2019-05-26 DIAGNOSIS — F4323 Adjustment disorder with mixed anxiety and depressed mood: Secondary | ICD-10-CM | POA: Diagnosis not present

## 2019-05-26 MED ORDER — SERTRALINE HCL 100 MG PO TABS: 100.0000 mg | ORAL_TABLET | Freq: Every day | ORAL | 1 refills | Status: DC

## 2019-05-26 NOTE — Progress Notes (Signed)
[name] [id number] 08-Nov-1950 68 y.o.  Subjective:   Patient ID:  [name] is a 68 y.o. (DOB 1951/08/22) female.  Chief Complaint:  Chief Complaint  Patient presents with  . Follow-up    h/o mood and anxiety s/s    HPI [name] presents to the office today for follow-up of anxiety and mood. She reports that she is "feeling better." She reports that she no longer feels that she is easily upset. Reports that she is now rarely tearful. She reports that anxiety has been manageable. Reports that physical s/s have resolved. Reports that anxious thoughts are now "very rare" and rumination has resolved. Describes mood as "good." Denies depressed mood or irritability. She reports that she has been sleeping well overall. She reports that her appetite has been good. Reports that she is rarely hungry unless she skipped a meal. She reports that her energy and motivation have been good. She reports that her concentration has been good. Denies SI. No longer feels compelled to straighten the house.  Moved into new home 04/24/19 and feels that she has adjusted to this. She reports that she and her husband have been getting along better. She reports that her husband is now approaching her differently.   Reports that she has seen Luan Moore, PhD a few times and this went well.   Review of Systems:  Review of Systems  Gastrointestinal:       Heartburn last night.  Musculoskeletal: Negative for gait problem.  Neurological: Negative for tremors.  Psychiatric/Behavioral:       Please refer to HPI    Medications: I have reviewed the patient's current medications.  Current Outpatient Medications  Medication Sig Dispense Refill  . acetaminophen (TYLENOL) 325 MG tablet Take 325-650 mg by mouth every 6 (six) hours as needed. Reported on 04/26/2016    . aspirin EC 81 MG tablet Take 81 mg by mouth daily.    Marland Kitchen atorvastatin (LIPITOR) 10 MG tablet TAKE 1 TABLET(10 MG) BY MOUTH DAILY 90 tablet 3   . calcium carbonate (OS-CAL) 600 MG TABS Take 600 mg by mouth 2 (two) times daily with a meal.      . Cholecalciferol (VITAMIN D) 2000 UNITS tablet Take 2,000 Units by mouth daily.    . clobetasol (TEMOVATE) 0.05 % external solution APPLY TO SCALP DAILY.  2  . KRILL OIL 1000 MG CAPS Take 1,000 mg by mouth every evening.    Marland Kitchen lisinopril-hydrochlorothiazide (ZESTORETIC) 20-12.5 MG tablet TAKE 1 TABLET BY MOUTH DAILY 90 tablet 0  . loratadine (CLARITIN) 10 MG tablet Take 10 mg by mouth daily as needed for allergies.    . metFORMIN (GLUCOPHAGE) 500 MG tablet TAKE 1 TABLET(500 MG) BY MOUTH TWICE DAILY WITH A MEAL 180 tablet 0  . Multiple Vitamins-Minerals (MULTIVITAMIN WITH MINERALS) tablet Take 1 tablet by mouth daily.     . Multiple Vitamins-Minerals (PRESERVISION AREDS 2+MULTI VIT PO) Take by mouth.    . pioglitazone (ACTOS) 30 MG tablet TAKE 1 TABLET(30 MG) BY MOUTH DAILY 90 tablet 0  . triamcinolone cream (KENALOG) 0.1 % Apply 1 application topically 2 (two) times daily. 30 g 0  . glucose blood (ONE TOUCH ULTRA TEST) test strip USE TO TEST ONCE A DAY AS DIRECTED 100 each 2  . Lancets (ONETOUCH ULTRASOFT) lancets USE AS DIRECTED TO TEST ONE TIME DAILY 200 each 0  . Misc Natural Products (COSAMIN ASU ADVANCED FORMULA) CAPS Take 2 each by mouth 2 (two) times daily.    Marland Kitchen  mupirocin ointment (BACTROBAN) 2 % Place 1 application into the nose 2 (two) times daily. 22 g 0  . neomycin-polymyxin-hydrocortisone (CORTISPORIN) 3.5-10000-1 OTIC suspension Place 3 drops into both ears 3 (three) times daily. 10 mL 0  . sertraline (ZOLOFT) 100 MG tablet Take 1 tablet (100 mg total) by mouth daily. 90 tablet 1  . sulfamethoxazole-trimethoprim (BACTRIM DS) 800-160 MG tablet Take 1 tablet by mouth 2 (two) times daily. (Patient not taking: Reported on 05/26/2019) 20 tablet 0   No current facility-administered medications for this visit.     Medication Side Effects: None  Allergies: No Known Allergies  Past  Medical History:  Diagnosis Date  . [facility]   . Diabetes mellitus   . Dyslipidemia   . Hyperlipidemia   . Hypertension   . Obesity   . Obesity     Family History  Problem Relation Age of Onset  . Asthma Other   . Hypertension Other   . Diabetes Other     Social History   Socioeconomic History  . Marital status: Married    Spouse name: Not on file  . Number of children: 2  . Years of education: Not on file  . Highest education level: Not on file  Occupational History  . Occupation: Personal assistant: [employer]  Social Needs  . Financial resource strain: Not on file  . Food insecurity    Worry: Not on file    Inability: Not on file  . Transportation needs    Medical: Not on file    Non-medical: Not on file  Tobacco Use  . Smoking status: Never Smoker  . Smokeless tobacco: Never Used  Substance and Sexual Activity  . Alcohol use: No  . Drug use: No  . Sexual activity: Yes    Birth control/protection: Surgical    Comment: hysterectomy  Lifestyle  . Physical activity    Days per week: Not on file    Minutes per session: Not on file  . Stress: Not on file  Relationships  . Social Herbalist on phone: Not on file    Gets together: Not on file    Attends religious service: Not on file    Active member of club or organization: Not on file    Attends meetings of clubs or organizations: Not on file    Relationship status: Not on file  . Intimate partner violence    Fear of current or ex partner: Not on file    Emotionally abused: Not on file    Physically abused: Not on file    Forced sexual activity: Not on file  Other Topics Concern  . Not on file  Social History Narrative  . Not on file    Past Medical History, Surgical history, Social history, and Family history were reviewed and updated as appropriate.   Please see review of systems for further details on the patient's review  from today.   Objective:   Physical Exam:  There were no vitals taken for this visit.  Physical Exam Constitutional:      General: She is not in acute distress.    Appearance: She is well-developed.  Musculoskeletal:        General: No deformity.  Neurological:     Mental Status: She is alert and oriented to person, place, and time.     Coordination: Coordination normal.  Psychiatric:  Attention and Perception: Attention and perception normal. She does not perceive auditory or visual hallucinations.        Mood and Affect: Mood normal. Mood is not anxious or depressed. Affect is not labile, blunt, angry or inappropriate.        Speech: Speech normal.        Behavior: Behavior normal.        Thought Content: Thought content normal. Thought content does not include homicidal or suicidal ideation. Thought content does not include homicidal or suicidal plan.        Cognition and Memory: Cognition and memory normal.        Judgment: Judgment normal.     Comments: Insight intact. No delusions.      Lab Review:     Component Value Date/Time   NA 143 05/12/2018 0959   K 4.1 05/12/2018 0959   CL 102 05/12/2018 0959   CO2 24 05/12/2018 0959   GLUCOSE 120 (H) 05/12/2018 0959   GLUCOSE 117 (H) 05/01/2017 0813   BUN 24 05/12/2018 0959   CREATININE 0.77 05/12/2018 0959   CREATININE 0.80 05/01/2017 0813   CALCIUM 9.6 05/12/2018 0959   CALCIUM 8.9 10/13/2012 0500   PROT 6.8 05/12/2018 0959   ALBUMIN 4.3 05/12/2018 0959   AST 18 05/12/2018 0959   ALT 22 05/12/2018 0959   ALKPHOS 75 05/12/2018 0959   BILITOT 0.2 05/12/2018 0959   GFRNONAA 80 05/12/2018 0959   GFRAA 92 05/12/2018 0959       Component Value Date/Time   WBC 3.6 05/12/2018 0959   WBC 4.1 05/01/2017 0813   RBC 3.94 05/12/2018 0959   RBC 3.85 05/01/2017 0813   HGB 11.9 05/12/2018 0959   HCT 36.2 05/12/2018 0959   PLT 236 05/12/2018 0959   MCV 92 05/12/2018 0959   MCH 30.2 05/12/2018 0959   MCH 29.6  05/01/2017 0813   MCHC 32.9 05/12/2018 0959   MCHC 31.9 (L) 05/01/2017 0813   RDW 14.4 05/12/2018 0959   LYMPHSABS 0.9 05/12/2018 0959   MONOABS 287 05/01/2017 0813   EOSABS 0.1 05/12/2018 0959   BASOSABS 0.0 05/12/2018 0959    No results found for: POCLITH, LITHIUM   No results found for: PHENYTOIN, PHENOBARB, VALPROATE, CBMZ   .res Assessment: Plan:   Will continue sertraline 100 mg daily since patient reports that mood and anxiety signs and symptoms have resolved with medication. Discussed length of treatment with sertraline and that is typically recommended to take an SSRI for at least 6 months following mood and anxiety signs and symptoms.  Discussed that sertraline is safe to take long-term if patient feels that she had some anxiety signs and symptoms baseline that are now controlled with sertraline.  Discussed continuing to assess of treatment on an ongoing basis. Agree with plan to follow-up with Luan Moore, PhD on an as needed basis only.  Patient to follow-up with this provider in 3 months or sooner if clinically indicated. Patient advised to contact office with any questions, adverse effects, or acute worsening in signs and symptoms.  Maddeline was seen today for follow-up.  Diagnoses and all orders for this visit:  Adjustment disorder with mixed anxiety and depressed mood -     sertraline (ZOLOFT) 100 MG tablet; Take 1 tablet (100 mg total) by mouth daily.     Please see After Visit Summary for patient specific instructions.  Future Appointments  Date Time Provider L[place]  08/26/2019  9:30 AM Thayer Headings, PMHNP CP-CP None  09/14/2019 10:15 AM Denita Lung, MD [facility]  12/17/2019  9:15 AM Hayden Pedro, MD TRE-TRE None  12/31/2019  8:30 AM Denita Lung, MD [facility]    No orders of the defined types were placed in this encounter.   -------------------------------

## 2019-06-18 ENCOUNTER — Other Ambulatory Visit: Payer: Self-pay | Admitting: Family Medicine

## 2019-06-18 DIAGNOSIS — E118 Type 2 diabetes mellitus with unspecified complications: Secondary | ICD-10-CM

## 2019-07-05 ENCOUNTER — Other Ambulatory Visit: Payer: Self-pay | Admitting: Family Medicine

## 2019-07-05 DIAGNOSIS — E785 Hyperlipidemia, unspecified: Secondary | ICD-10-CM

## 2019-07-05 DIAGNOSIS — E1169 Type 2 diabetes mellitus with other specified complication: Secondary | ICD-10-CM

## 2019-08-01 ENCOUNTER — Other Ambulatory Visit: Payer: Self-pay | Admitting: Family Medicine

## 2019-08-01 DIAGNOSIS — E1159 Type 2 diabetes mellitus with other circulatory complications: Secondary | ICD-10-CM

## 2019-08-01 DIAGNOSIS — E118 Type 2 diabetes mellitus with unspecified complications: Secondary | ICD-10-CM

## 2019-08-03 ENCOUNTER — Other Ambulatory Visit: Payer: Self-pay

## 2019-08-03 ENCOUNTER — Telehealth: Payer: Self-pay | Admitting: Family Medicine

## 2019-08-03 DIAGNOSIS — E118 Type 2 diabetes mellitus with unspecified complications: Secondary | ICD-10-CM

## 2019-08-03 DIAGNOSIS — E1159 Type 2 diabetes mellitus with other circulatory complications: Secondary | ICD-10-CM

## 2019-08-03 MED ORDER — LISINOPRIL-HYDROCHLOROTHIAZIDE 20-12.5 MG PO TABS: 1.0000 | ORAL_TABLET | Freq: Every day | ORAL | 0 refills | Status: DC

## 2019-08-03 MED ORDER — PIOGLITAZONE HCL 30 MG PO TABS: ORAL_TABLET | ORAL | 0 refills | Status: DC

## 2019-08-03 NOTE — Telephone Encounter (Signed)
Pt needs refills Actos & Lisinopril 90 days each to [facility]

## 2019-08-08 NOTE — Telephone Encounter (Signed)
done

## 2019-08-17 ENCOUNTER — Other Ambulatory Visit: Payer: Self-pay

## 2019-08-17 ENCOUNTER — Ambulatory Visit (INDEPENDENT_AMBULATORY_CARE_PROVIDER_SITE_OTHER): Payer: Medicare Other | Admitting: Family Medicine

## 2019-08-17 ENCOUNTER — Encounter: Payer: Self-pay | Admitting: Family Medicine

## 2019-08-17 VITALS — Temp 97.7°F | Wt 216.0 lb

## 2019-08-17 DIAGNOSIS — Z20828 Contact with and (suspected) exposure to other viral communicable diseases: Secondary | ICD-10-CM

## 2019-08-17 DIAGNOSIS — Z20822 Contact with and (suspected) exposure to covid-19: Secondary | ICD-10-CM

## 2019-08-17 NOTE — Progress Notes (Signed)
   Subjective:    Patient ID: [name], female    DOB: April 14, 1951, 68 y.o.   MRN: [id number]  HPI Documentation for virtual telephone encounter. Documentation for virtual audio and video telecommunications through [facility] encounter: The patient was located at home. The provider was located in the office. The patient did consent to this visit and is aware of possible charges through their insurance for this visit. The other persons participating in this telemedicine service were none. This virtual service is not related to other E/M service within previous 7 days. She has a 4-day history of started with cough but no associated fever, chills, sore throat, earache, shortness of breath, sneezing, itchy watery eyes or smell or taste changes.    Review of Systems     Objective:   Physical Exam Alert and in no distress otherwise not examined      Assessment & Plan:  Suspected COVID-19 virus infection She is to go get Covid tested at [facility] hospital.  Recommend supportive care for her cough.  Cautioned to socially isolate until we get the results back.

## 2019-08-18 LAB — NOVEL CORONAVIRUS, NAA: SARS-CoV-2, NAA: NOT DETECTED

## 2019-08-26 ENCOUNTER — Other Ambulatory Visit: Payer: Self-pay

## 2019-08-26 ENCOUNTER — Encounter: Payer: Self-pay | Admitting: Psychiatry

## 2019-08-26 ENCOUNTER — Ambulatory Visit (INDEPENDENT_AMBULATORY_CARE_PROVIDER_SITE_OTHER): Payer: Medicare Other | Admitting: Psychiatry

## 2019-08-26 DIAGNOSIS — F4323 Adjustment disorder with mixed anxiety and depressed mood: Secondary | ICD-10-CM | POA: Diagnosis not present

## 2019-08-26 MED ORDER — SERTRALINE HCL 100 MG PO TABS: 100.0000 mg | ORAL_TABLET | Freq: Every day | ORAL | 1 refills | Status: DC

## 2019-08-26 NOTE — Progress Notes (Signed)
[name] [id number] 03-02-1951 68 y.o.  Subjective:   Patient ID:  [name] is a 68 y.o. (DOB October 20, 1951) female.  Chief Complaint:  Chief Complaint  Patient presents with  . Follow-up    h/o anxiety and depression    HPI [name] presents to the office today for follow-up of adjustment disorder.She reports that she has been doing well overall and getting adjusted to  Changes. She denies significant depression. Reports that husband has not been feeling well and that they went to the ER over the weekend. She thinks that he may have been dehydrated. He then saw his PCP this week and will be getting a heart monitor. She reports that he had severe frustration with accessing his pt portal yesterday and was verbally agitated at times. She reports that she was able to remain calm throughout this. Had some brief tearfulness during those and removed herself from the situation. Denies anxiety. She reports sleep has been variable. Appetite has been stable. Energy and motivation has been good. She reports adequate concentration. Denies SI.  Had a vacation with her daughter to visit family in [place]. 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 She reports that she is no longer "holding things in" and is verbalizing her feelings.   Going to be a great-grandmother. Granddaughter expecting a baby in February.   Past Psychiatric Medication Trials: Sertraline  Review of Systems:  Review of Systems  Musculoskeletal: Negative for gait problem.  Neurological: Negative for tremors.  Psychiatric/Behavioral:       Please refer to HPI    Medications: I have reviewed the patient's current medications.  Current Outpatient Medications  Medication Sig Dispense Refill  . acetaminophen (TYLENOL) 325 MG tablet Take 325-650 mg by mouth every 6 (six) hours as needed. Reported on 04/26/2016    . aspirin EC 81 MG tablet Take 81 mg by mouth daily.    Marland Kitchen atorvastatin  (LIPITOR) 10 MG tablet TAKE 1 TABLET(10 MG) BY MOUTH DAILY 90 tablet 3  . calcium carbonate (OS-CAL) 600 MG TABS Take 600 mg by mouth 2 (two) times daily with a meal.      . Cholecalciferol (VITAMIN D) 2000 UNITS tablet Take 2,000 Units by mouth daily.    . clobetasol (TEMOVATE) 0.05 % external solution APPLY TO SCALP DAILY.  2  . glucose blood (ONE TOUCH ULTRA TEST) test strip USE TO TEST ONCE A DAY AS DIRECTED 100 each 2  . KRILL OIL 1000 MG CAPS Take 1,000 mg by mouth every evening.    . Lancets (ONETOUCH ULTRASOFT) lancets USE AS DIRECTED TO TEST ONE TIME DAILY 200 each 0  . lisinopril-hydrochlorothiazide (ZESTORETIC) 20-12.5 MG tablet Take 1 tablet by mouth daily. 90 tablet 0  . loratadine (CLARITIN) 10 MG tablet Take 10 mg by mouth daily as needed for allergies.    . metFORMIN (GLUCOPHAGE) 500 MG tablet TAKE 1 TABLET(500 MG) BY MOUTH TWICE DAILY WITH A MEAL 180 tablet 0  . Misc Natural Products (COSAMIN ASU ADVANCED FORMULA) CAPS Take 2 each by mouth 2 (two) times daily.    . Multiple Vitamins-Minerals (MULTIVITAMIN WITH MINERALS) tablet Take 1 tablet by mouth daily.     . Multiple Vitamins-Minerals (PRESERVISION AREDS 2+MULTI VIT PO) Take by mouth.    . mupirocin ointment (BACTROBAN) 2 % Place 1 application into the nose 2 (two) times daily. 22 g 0  . neomycin-polymyxin-hydrocortisone (CORTISPORIN) 3.5-10000-1 OTIC suspension Place 3 drops into both ears  3 (three) times daily. 10 mL 0  . pioglitazone (ACTOS) 30 MG tablet Take 1 tablet daily 90 tablet 0  . sertraline (ZOLOFT) 100 MG tablet Take 1 tablet (100 mg total) by mouth daily. 90 tablet 1  . sulfamethoxazole-trimethoprim (BACTRIM DS) 800-160 MG tablet Take 1 tablet by mouth 2 (two) times daily. (Patient not taking: Reported on 05/26/2019) 20 tablet 0  . triamcinolone cream (KENALOG) 0.1 % Apply 1 application topically 2 (two) times daily. 30 g 0   No current facility-administered medications for this visit.     Medication Side  Effects: None  Allergies: No Known Allergies  Past Medical History:  Diagnosis Date  . [facility]   . Diabetes mellitus   . Dyslipidemia   . Hyperlipidemia   . Hypertension   . Obesity   . Obesity     Family History  Problem Relation Age of Onset  . Asthma Other   . Hypertension Other   . Diabetes Other     Social History   Socioeconomic History  . Marital status: Married    Spouse name: Not on file  . Number of children: 2  . Years of education: Not on file  . Highest education level: Not on file  Occupational History  . Occupation: Personal assistant: [employer]  Social Needs  . Financial resource strain: Not on file  . Food insecurity    Worry: Not on file    Inability: Not on file  . Transportation needs    Medical: Not on file    Non-medical: Not on file  Tobacco Use  . Smoking status: Never Smoker  . Smokeless tobacco: Never Used  Substance and Sexual Activity  . Alcohol use: No  . Drug use: No  . Sexual activity: Yes    Birth control/protection: Surgical    Comment: hysterectomy  Lifestyle  . Physical activity    Days per week: Not on file    Minutes per session: Not on file  . Stress: Not on file  Relationships  . Social Herbalist on phone: Not on file    Gets together: Not on file    Attends religious service: Not on file    Active member of club or organization: Not on file    Attends meetings of clubs or organizations: Not on file    Relationship status: Not on file  . Intimate partner violence    Fear of current or ex partner: Not on file    Emotionally abused: Not on file    Physically abused: Not on file    Forced sexual activity: Not on file  Other Topics Concern  . Not on file  Social History Narrative  . Not on file    Past Medical History, Surgical history, Social history, and Family history were reviewed and updated as appropriate.   Please see review of  systems for further details on the patient's review from today.   Objective:   Physical Exam:  There were no vitals taken for this visit.  Physical Exam Constitutional:      General: She is not in acute distress.    Appearance: She is well-developed.  Musculoskeletal:        General: No deformity.  Neurological:     Mental Status: She is alert and oriented to person, place, and time.     Coordination: Coordination normal.  Psychiatric:  Attention and Perception: Attention and perception normal. She does not perceive auditory or visual hallucinations.        Mood and Affect: Mood normal. Mood is not anxious or depressed. Affect is not labile, blunt, angry or inappropriate.        Speech: Speech normal.        Behavior: Behavior normal.        Thought Content: Thought content normal. Thought content does not include homicidal or suicidal ideation. Thought content does not include homicidal or suicidal plan.        Cognition and Memory: Cognition and memory normal.        Judgment: Judgment normal.     Comments: Insight intact. No delusions.      Lab Review:     Component Value Date/Time   NA 143 05/12/2018 0959   K 4.1 05/12/2018 0959   CL 102 05/12/2018 0959   CO2 24 05/12/2018 0959   GLUCOSE 120 (H) 05/12/2018 0959   GLUCOSE 117 (H) 05/01/2017 0813   BUN 24 05/12/2018 0959   CREATININE 0.77 05/12/2018 0959   CREATININE 0.80 05/01/2017 0813   CALCIUM 9.6 05/12/2018 0959   CALCIUM 8.9 10/13/2012 0500   PROT 6.8 05/12/2018 0959   ALBUMIN 4.3 05/12/2018 0959   AST 18 05/12/2018 0959   ALT 22 05/12/2018 0959   ALKPHOS 75 05/12/2018 0959   BILITOT 0.2 05/12/2018 0959   GFRNONAA 80 05/12/2018 0959   GFRAA 92 05/12/2018 0959       Component Value Date/Time   WBC 3.6 05/12/2018 0959   WBC 4.1 05/01/2017 0813   RBC 3.94 05/12/2018 0959   RBC 3.85 05/01/2017 0813   HGB 11.9 05/12/2018 0959   HCT 36.2 05/12/2018 0959   PLT 236 05/12/2018 0959   MCV 92  05/12/2018 0959   MCH 30.2 05/12/2018 0959   MCH 29.6 05/01/2017 0813   MCHC 32.9 05/12/2018 0959   MCHC 31.9 (L) 05/01/2017 0813   RDW 14.4 05/12/2018 0959   LYMPHSABS 0.9 05/12/2018 0959   MONOABS 287 05/01/2017 0813   EOSABS 0.1 05/12/2018 0959   BASOSABS 0.0 05/12/2018 0959    No results found for: POCLITH, LITHIUM   No results found for: PHENYTOIN, PHENOBARB, VALPROATE, CBMZ   .res Assessment: Plan:   Pt reports that she would prefer to continue Sertraline at 100 mg po qd dose at this time since her mood and anxiety s/s have been well controlled without any significant tolerability issues. Also discussed considering possible decrease in the future once situational stressors have decreased.  Pt to f/u in 4 months or sooner if clinically indicated.  Patient advised to contact office with any questions, adverse effects, or acute worsening in signs and symptoms.  Allyx was seen today for follow-up.  Diagnoses and all orders for this visit:  Adjustment disorder with mixed anxiety and depressed mood -     sertraline (ZOLOFT) 100 MG tablet; Take 1 tablet (100 mg total) by mouth daily.     Please see After Visit Summary for patient specific instructions.  Future Appointments  Date Time Provider [place]  09/14/2019 10:15 AM Denita Lung, MD PFM[facility]  12/17/2019  9:15 AM Hayden Pedro, MD TRE-TRE None  12/24/2019  9:30 AM Thayer Headings, PMHNP CP-CP None  12/31/2019  8:30 AM Denita Lung, MD PFM-PFM [facility]    No orders of the defined types were placed in this encounter.   -------------------------------

## 2019-08-27 DIAGNOSIS — Z1231 Encounter for screening mammogram for malignant neoplasm of breast: Secondary | ICD-10-CM | POA: Diagnosis not present

## 2019-08-27 LAB — HM MAMMOGRAPHY

## 2019-09-08 ENCOUNTER — Other Ambulatory Visit: Payer: Self-pay | Admitting: Family Medicine

## 2019-09-08 DIAGNOSIS — E118 Type 2 diabetes mellitus with unspecified complications: Secondary | ICD-10-CM

## 2019-09-10 ENCOUNTER — Encounter: Payer: Self-pay | Admitting: Family Medicine

## 2019-09-14 ENCOUNTER — Ambulatory Visit (INDEPENDENT_AMBULATORY_CARE_PROVIDER_SITE_OTHER): Payer: Medicare Other | Admitting: Family Medicine

## 2019-09-14 ENCOUNTER — Encounter: Payer: Self-pay | Admitting: Family Medicine

## 2019-09-14 ENCOUNTER — Other Ambulatory Visit: Payer: Self-pay

## 2019-09-14 VITALS — BP 138/80 | HR 59 | Temp 96.8°F | Ht 64.0 in | Wt 212.8 lb

## 2019-09-14 DIAGNOSIS — Z9849 Cataract extraction status, unspecified eye: Secondary | ICD-10-CM

## 2019-09-14 DIAGNOSIS — F32A Depression, unspecified: Secondary | ICD-10-CM

## 2019-09-14 DIAGNOSIS — Z23 Encounter for immunization: Secondary | ICD-10-CM | POA: Diagnosis not present

## 2019-09-14 DIAGNOSIS — I10 Essential (primary) hypertension: Secondary | ICD-10-CM | POA: Diagnosis not present

## 2019-09-14 DIAGNOSIS — E118 Type 2 diabetes mellitus with unspecified complications: Secondary | ICD-10-CM

## 2019-09-14 DIAGNOSIS — J301 Allergic rhinitis due to pollen: Secondary | ICD-10-CM

## 2019-09-14 DIAGNOSIS — F419 Anxiety disorder, unspecified: Secondary | ICD-10-CM

## 2019-09-14 DIAGNOSIS — E1169 Type 2 diabetes mellitus with other specified complication: Secondary | ICD-10-CM | POA: Diagnosis not present

## 2019-09-14 DIAGNOSIS — E785 Hyperlipidemia, unspecified: Secondary | ICD-10-CM | POA: Diagnosis not present

## 2019-09-14 DIAGNOSIS — H40039 Anatomical narrow angle, unspecified eye: Secondary | ICD-10-CM

## 2019-09-14 DIAGNOSIS — E1159 Type 2 diabetes mellitus with other circulatory complications: Secondary | ICD-10-CM | POA: Diagnosis not present

## 2019-09-14 DIAGNOSIS — F329 Major depressive disorder, single episode, unspecified: Secondary | ICD-10-CM

## 2019-09-14 DIAGNOSIS — I152 Hypertension secondary to endocrine disorders: Secondary | ICD-10-CM

## 2019-09-14 LAB — POCT GLYCOSYLATED HEMOGLOBIN (HGB A1C): Hemoglobin A1C: 6.2 % — AB (ref 4.0–5.6)

## 2019-09-14 LAB — POCT UA - MICROALBUMIN
Albumin/Creatinine Ratio, Urine, POC: 7.5
Creatinine, POC: 101.4 mg/dL
Microalbumin Ur, POC: 7.6 mg/L

## 2019-09-14 MED ORDER — PIOGLITAZONE HCL 30 MG PO TABS: ORAL_TABLET | ORAL | 1 refills | Status: DC

## 2019-09-14 MED ORDER — METFORMIN HCL 500 MG PO TABS: 500.0000 mg | ORAL_TABLET | Freq: Two times a day (BID) | ORAL | 1 refills | Status: DC

## 2019-09-14 NOTE — Progress Notes (Signed)
Subjective:    Patient ID: [name], female    DOB: 07-Apr-1951, 68 y.o.   MRN: [id number]  [name] is a 68 y.o. female who presents for follow-up of Type 2 diabetes mellitus.  Home blood sugar records: pt keeps a log and meter records 110-151 fasting and two hours post meal Current symptoms/problems include none at this time. Daily foot checks:yes   Any foot concerns: none Exercise: walking   Diet: regular diet.  She has lost several pounds. She has had previous cataract surgery and does have glaucoma and will follow up in the near future with ophthalmology.  She continues on Actos and Metformin and having no difficulty with that.  Continues on sertraline which seems to keep her psychologically stable.  She is taking lisinopril/HCTZ with no difficulty and also taking atorvastatin without aches or pains. The following portions of the patient's history were reviewed and updated as appropriate: allergies, current medications, past medical history, past social history and problem list.  ROS as in subjective above.     Objective:    Physical Exam Alert and in no distress foot exam is normal.  Lab Review Diabetic Labs Latest Ref Rng & Units 12/30/2018 09/11/2018 05/12/2018 01/06/2018 09/03/2017  HbA1c 4.0 - 5.6 % 6.3(A) 6.4(A) 6.2(A) 6.1 6.2  Microalbumin mg/L - - 13.0 - -  Micro/Creat Ratio - - - 8.3 - -  Chol 100 - 199 mg/dL - - 139 - -  HDL >39 mg/dL - - 51 - -  Calc LDL 0 - 99 mg/dL - - 68 - -  Triglycerides 0 - 149 mg/dL - - 101 - -  Creatinine 0.57 - 1.00 mg/dL - - 0.77 - -   BP/Weight 08/17/2019 05/11/2019 05/06/2019 05/01/2019 [id number]  Systolic BP - - 99991111 [id number]  Diastolic BP - - 62 80 82  Wt. (Lbs) 216 216 216.5 216.6 225  BMI 38.26 38.26 38.35 37.18 38.62  Some encounter information is confidential and restricted. Go to Review Flowsheets activity to see all data.   Foot/eye exam completion dates Latest Ref Rng & Units 12/30/2018 12/16/2018  Eye Exam No  Retinopathy - No Retinopathy  Foot Form Completion - Done -  Hemoglobin A1c is 6.2. [name]  reports that she has never smoked. She has never used smokeless tobacco. She reports that she does not drink alcohol or use drugs.     Assessment & Plan:    Controlled type 2 diabetes mellitus with complication, without long-term current use of insulin ([place]) - Plan: POCT UA - Microalbumin, CBC with Differential, Comprehensive metabolic panel, Lipid panel, pioglitazone (ACTOS) 30 MG tablet  Hypertension associated with diabetes ([place]) - Plan: CBC with Differential, Comprehensive metabolic panel  Hyperlipidemia associated with type 2 diabetes mellitus ([place]) - Plan: Lipid panel  Morbid obesity ([facility])  Seasonal allergic rhinitis due to pollen  Borderline glaucoma with anatomical narrow angle, unspecified laterality  Status post cataract extraction, unspecified laterality  Anxiety and depression  Need for influenza vaccination - Plan: Flu Vaccine QUAD High Dose(Fluad)  Type 2 diabetes mellitus with complication, without long-term current use of insulin ([facility]) - Plan: metFORMIN (GLUCOPHAGE) 500 MG tablet   1. Rx changes: none 2. Education: Reviewed 'ABCs' of diabetes management (respective goals in parentheses):  A1C (<7), blood pressure (<130/80), and cholesterol (LDL <100). 3. Compliance at present is estimated to be good. Efforts to improve compliance (if necessary) will be directed at increased exercise. 4. Follow up: 6 months

## 2019-09-15 LAB — COMPREHENSIVE METABOLIC PANEL
ALT: 18 IU/L (ref 0–32)
AST: 19 IU/L (ref 0–40)
Albumin/Globulin Ratio: 2 (ref 1.2–2.2)
Albumin: 4.3 g/dL (ref 3.8–4.8)
Alkaline Phosphatase: 71 IU/L (ref 39–117)
BUN/Creatinine Ratio: 22 (ref 12–28)
BUN: 17 mg/dL (ref 8–27)
Bilirubin Total: 0.3 mg/dL (ref 0.0–1.2)
CO2: 26 mmol/L (ref 20–29)
Calcium: 9.7 mg/dL (ref 8.7–10.3)
Chloride: 102 mmol/L (ref 96–106)
Creatinine, Ser: 0.79 mg/dL (ref 0.57–1.00)
GFR calc Af Amer: 88 mL/min/{1.73_m2} (ref >59)
GFR calc non Af Amer: 77 mL/min/{1.73_m2} (ref >59)
Globulin, Total: 2.1 g/dL (ref 1.5–4.5)
Glucose: 117 mg/dL — ABNORMAL HIGH (ref 65–99)
Potassium: 4.6 mmol/L (ref 3.5–5.2)
Sodium: 142 mmol/L (ref 134–144)
Total Protein: 6.4 g/dL (ref 6.0–8.5)

## 2019-09-15 LAB — CBC WITH DIFFERENTIAL/PLATELET
Basophils Absolute: 0 10*3/uL (ref 0.0–0.2)
Basos: 1 %
EOS (ABSOLUTE): 0.2 10*3/uL (ref 0.0–0.4)
Eos: 4 %
Hematocrit: 36.3 % (ref 34.0–46.6)
Hemoglobin: 11.7 g/dL (ref 11.1–15.9)
Immature Grans (Abs): 0 10*3/uL (ref 0.0–0.1)
Immature Granulocytes: 0 %
Lymphocytes Absolute: 0.8 10*3/uL (ref 0.7–3.1)
Lymphs: 19 %
MCH: 30.1 pg (ref 26.6–33.0)
MCHC: 32.2 g/dL (ref 31.5–35.7)
MCV: 93 fL (ref 79–97)
Monocytes Absolute: 0.4 10*3/uL (ref 0.1–0.9)
Monocytes: 9 %
Neutrophils Absolute: 2.8 10*3/uL (ref 1.4–7.0)
Neutrophils: 67 %
Platelets: 220 10*3/uL (ref 150–450)
RBC: 3.89 x10E6/uL (ref 3.77–5.28)
RDW: 12.7 % (ref 11.7–15.4)
WBC: 4.2 10*3/uL (ref 3.4–10.8)

## 2019-09-15 LAB — LIPID PANEL
Chol/HDL Ratio: 2.4 ratio (ref 0.0–4.4)
Cholesterol, Total: 130 mg/dL (ref 100–199)
HDL: 54 mg/dL (ref >39)
LDL Chol Calc (NIH): 60 mg/dL (ref 0–99)
Triglycerides: 85 mg/dL (ref 0–149)
VLDL Cholesterol Cal: 16 mg/dL (ref 5–40)

## 2019-10-12 DIAGNOSIS — H26493 Other secondary cataract, bilateral: Secondary | ICD-10-CM | POA: Diagnosis not present

## 2019-10-12 DIAGNOSIS — E119 Type 2 diabetes mellitus without complications: Secondary | ICD-10-CM | POA: Diagnosis not present

## 2019-10-12 DIAGNOSIS — H40033 Anatomical narrow angle, bilateral: Secondary | ICD-10-CM | POA: Diagnosis not present

## 2019-10-12 DIAGNOSIS — H35372 Puckering of macula, left eye: Secondary | ICD-10-CM | POA: Diagnosis not present

## 2019-10-12 DIAGNOSIS — Z961 Presence of intraocular lens: Secondary | ICD-10-CM | POA: Diagnosis not present

## 2019-10-12 LAB — HM DIABETES EYE EXAM

## 2019-10-26 DIAGNOSIS — H409 Unspecified glaucoma: Secondary | ICD-10-CM | POA: Insufficient documentation

## 2019-11-05 ENCOUNTER — Other Ambulatory Visit: Payer: Self-pay | Admitting: Family Medicine

## 2019-11-05 DIAGNOSIS — E1159 Type 2 diabetes mellitus with other circulatory complications: Secondary | ICD-10-CM

## 2019-11-06 ENCOUNTER — Other Ambulatory Visit: Payer: Self-pay | Admitting: Family Medicine

## 2019-11-06 DIAGNOSIS — E118 Type 2 diabetes mellitus with unspecified complications: Secondary | ICD-10-CM

## 2019-11-06 MED ORDER — LISINOPRIL-HYDROCHLOROTHIAZIDE 20-12.5 MG PO TABS: 1.0000 | ORAL_TABLET | Freq: Every day | ORAL | 0 refills | Status: DC

## 2019-11-19 DIAGNOSIS — I8393 Asymptomatic varicose veins of bilateral lower extremities: Secondary | ICD-10-CM | POA: Diagnosis not present

## 2019-11-19 DIAGNOSIS — D1801 Hemangioma of skin and subcutaneous tissue: Secondary | ICD-10-CM | POA: Diagnosis not present

## 2019-11-19 DIAGNOSIS — L249 Irritant contact dermatitis, unspecified cause: Secondary | ICD-10-CM | POA: Diagnosis not present

## 2019-11-19 DIAGNOSIS — L821 Other seborrheic keratosis: Secondary | ICD-10-CM | POA: Diagnosis not present

## 2019-11-19 DIAGNOSIS — D229 Melanocytic nevi, unspecified: Secondary | ICD-10-CM | POA: Diagnosis not present

## 2019-11-30 DIAGNOSIS — H524 Presbyopia: Secondary | ICD-10-CM | POA: Diagnosis not present

## 2019-11-30 DIAGNOSIS — E119 Type 2 diabetes mellitus without complications: Secondary | ICD-10-CM | POA: Diagnosis not present

## 2019-12-01 ENCOUNTER — Other Ambulatory Visit: Payer: Self-pay | Admitting: Family Medicine

## 2019-12-01 DIAGNOSIS — E118 Type 2 diabetes mellitus with unspecified complications: Secondary | ICD-10-CM

## 2019-12-04 DIAGNOSIS — H608X3 Other otitis externa, bilateral: Secondary | ICD-10-CM | POA: Diagnosis not present

## 2019-12-08 DIAGNOSIS — Z1159 Encounter for screening for other viral diseases: Secondary | ICD-10-CM | POA: Diagnosis not present

## 2019-12-11 DIAGNOSIS — Z86010 Personal history of colonic polyps: Secondary | ICD-10-CM | POA: Diagnosis not present

## 2019-12-11 DIAGNOSIS — K5730 Diverticulosis of large intestine without perforation or abscess without bleeding: Secondary | ICD-10-CM | POA: Diagnosis not present

## 2019-12-11 DIAGNOSIS — K635 Polyp of colon: Secondary | ICD-10-CM | POA: Diagnosis not present

## 2019-12-11 LAB — HM COLONOSCOPY

## 2019-12-15 DIAGNOSIS — K635 Polyp of colon: Secondary | ICD-10-CM | POA: Diagnosis not present

## 2019-12-17 ENCOUNTER — Other Ambulatory Visit: Payer: Self-pay

## 2019-12-17 ENCOUNTER — Encounter (INDEPENDENT_AMBULATORY_CARE_PROVIDER_SITE_OTHER): Payer: Medicare Other | Admitting: Ophthalmology

## 2019-12-17 DIAGNOSIS — H353112 Nonexudative age-related macular degeneration, right eye, intermediate dry stage: Secondary | ICD-10-CM

## 2019-12-17 DIAGNOSIS — H43813 Vitreous degeneration, bilateral: Secondary | ICD-10-CM | POA: Diagnosis not present

## 2019-12-17 DIAGNOSIS — H35372 Puckering of macula, left eye: Secondary | ICD-10-CM | POA: Diagnosis not present

## 2019-12-17 DIAGNOSIS — H35033 Hypertensive retinopathy, bilateral: Secondary | ICD-10-CM | POA: Diagnosis not present

## 2019-12-17 DIAGNOSIS — I10 Essential (primary) hypertension: Secondary | ICD-10-CM | POA: Diagnosis not present

## 2019-12-17 LAB — HM DIABETES EYE EXAM

## 2019-12-18 ENCOUNTER — Ambulatory Visit: Payer: Medicare Other

## 2019-12-19 ENCOUNTER — Ambulatory Visit: Payer: Medicare Other | Attending: Internal Medicine

## 2019-12-19 DIAGNOSIS — Z23 Encounter for immunization: Secondary | ICD-10-CM

## 2019-12-19 NOTE — Progress Notes (Signed)
   Covid-19 Vaccination Clinic  Name:  [name]    MRN: [id number] DOB: 1951/02/26  12/19/2019  Ms. [name] was observed post Covid-19 immunization for 15 minutes without incidence. She was provided with Vaccine Information Sheet and instruction to access the V-Safe system.   Ms. [name] was instructed to call 911 with any severe reactions post vaccine: Marland Kitchen Difficulty breathing  . Swelling of your face and throat  . A fast heartbeat  . A bad rash all over your body  . Dizziness and weakness    Immunizations Administered    Name Date Dose VIS Date Route   Pfizer COVID-19 Vaccine 12/19/2019  9:11 AM 0.3 mL 10/02/2019 Intramuscular   Manufacturer: [place]   Lot: [id number]   [place]: [id number]

## 2019-12-24 ENCOUNTER — Ambulatory Visit (INDEPENDENT_AMBULATORY_CARE_PROVIDER_SITE_OTHER): Payer: Medicare Other | Admitting: Psychiatry

## 2019-12-24 ENCOUNTER — Other Ambulatory Visit: Payer: Self-pay

## 2019-12-24 ENCOUNTER — Encounter: Payer: Self-pay | Admitting: Psychiatry

## 2019-12-24 DIAGNOSIS — F4323 Adjustment disorder with mixed anxiety and depressed mood: Secondary | ICD-10-CM | POA: Diagnosis not present

## 2019-12-24 MED ORDER — SERTRALINE HCL 50 MG PO TABS: 75.0000 mg | ORAL_TABLET | Freq: Every day | ORAL | 0 refills | Status: DC

## 2019-12-24 NOTE — Progress Notes (Signed)
[name] [id number] 1951/09/26 69 y.o.  Subjective:   Patient ID:  [name] is a 69 y.o. (DOB 03-17-1951) female.  Chief Complaint:  Chief Complaint  Patient presents with  . Follow-up    h/o adjustment d/o    HPI [name] presents to the office today for follow-up of adjustment disorder. She reports that she had anxiety when her bank account was several dollars off. She then realized she had transposed some numbers. She reports that her anxiety has otherwise been well controlled. She reports that her mood has been stable overall. She reports that her sleep has been disrupted at times. She reports awakening every few hours over the last month. Denies caffeine use. Appetite has been ok. She reports that energy and motivation have been ok, other than being tired when she has not been able to sleep well. Will take about an hour nap in the afternoon. Concentration is been adequate. Denies anhedonia. Denies SI.   Husband has been feeling better. Husband had his identity stolen and had to close down his accounts and did not have internet for a period of time. 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   Had first [place] vaccination.   Past Psychiatric Medication Trials: Sertraline  PHQ2-9     Office Visit from 12/30/2018 in [place] Visit from 05/12/2018 in [place] Visit from 12/26/2016 in [place] Visit from 09/13/2015 in [place] from [facility]  PHQ-2 Total Score  0  0  0  0  0       Review of Systems:  Review of Systems  Musculoskeletal: Negative for gait problem.  Neurological:       Occ tremor when she picks up objects.   Psychiatric/Behavioral:       Please refer to HPI    Medications: I have reviewed the patient's current medications.  Current Outpatient Medications  Medication Sig Dispense Refill  . acetaminophen (TYLENOL)  325 MG tablet Take 325-650 mg by mouth every 6 (six) hours as needed. Reported on 04/26/2016    . aspirin EC 81 MG tablet Take 81 mg by mouth daily.    Marland Kitchen atorvastatin (LIPITOR) 10 MG tablet TAKE 1 TABLET(10 MG) BY MOUTH DAILY 90 tablet 3  . calcium carbonate (OS-CAL) 600 MG TABS Take 600 mg by mouth 2 (two) times daily with a meal.      . Cholecalciferol (VITAMIN D) 2000 UNITS tablet Take 2,000 Units by mouth daily.    . clobetasol (TEMOVATE) 0.05 % external solution APPLY TO SCALP DAILY.  2  . glucose blood (ONE TOUCH ULTRA TEST) test strip USE TO TEST ONCE A DAY AS DIRECTED 100 each 2  . KRILL OIL 1000 MG CAPS Take 1,000 mg by mouth every evening.    . Lancets (ONETOUCH ULTRASOFT) lancets USE AS DIRECTED TO TEST ONE TIME DAILY 200 each 0  . lisinopril-hydrochlorothiazide (ZESTORETIC) 20-12.5 MG tablet Take 1 tablet by mouth daily. 90 tablet 0  . loratadine (CLARITIN) 10 MG tablet Take 10 mg by mouth daily as needed for allergies.    . metFORMIN (GLUCOPHAGE) 500 MG tablet Take 1 tablet (500 mg total) by mouth 2 (two) times daily with a meal. 180 tablet 1  . Misc Natural Products (COSAMIN ASU ADVANCED FORMULA) CAPS Take 2 each by mouth 2 (two) times daily.    . Multiple Vitamins-Minerals (MULTIVITAMIN WITH MINERALS) tablet Take 1 tablet by mouth  daily.     . Multiple Vitamins-Minerals (PRESERVISION AREDS 2+MULTI VIT PO) Take by mouth.    . mupirocin ointment (BACTROBAN) 2 % Place 1 application into the nose 2 (two) times daily. 22 g 0  . neomycin-polymyxin-hydrocortisone (CORTISPORIN) 3.5-10000-1 OTIC suspension Place 3 drops into both ears 3 (three) times daily. 10 mL 0  . pioglitazone (ACTOS) 30 MG tablet TAKE 1 TABLET BY MOUTH DAILY 90 tablet 0  . sulfamethoxazole-trimethoprim (BACTRIM DS) 800-160 MG tablet Take 1 tablet by mouth 2 (two) times daily. 20 tablet 0  . triamcinolone cream (KENALOG) 0.1 % Apply 1 application topically 2 (two) times daily. 30 g 0  . sertraline (ZOLOFT) 50 MG tablet  Take 1.5 tablets (75 mg total) by mouth daily. 135 tablet 0   No current facility-administered medications for this visit.    Medication Side Effects: None  Allergies: No Known Allergies  Past Medical History:  Diagnosis Date  . Basal cell carcinoma of nose 11/30/13   [facility]   . Diabetes mellitus   . Dyslipidemia   . Hyperlipidemia   . Hypertension   . Obesity   . Obesity     Family History  Problem Relation Age of Onset  . Asthma Other   . Hypertension Other   . Diabetes Other     Social History   Socioeconomic History  . Marital status: Married    Spouse name: Not on file  . Number of children: 2  . Years of education: Not on file  . Highest education level: Not on file  Occupational History  . Occupation: Personal assistant: [employer]  Tobacco Use  . Smoking status: Never Smoker  . Smokeless tobacco: Never Used  Substance and Sexual Activity  . Alcohol use: No  . Drug use: No  . Sexual activity: Yes    Birth control/protection: Surgical    Comment: hysterectomy  Other Topics Concern  . Not on file  Social History Narrative  . Not on file   Social Determinants of Health   Financial Resource Strain:   . Difficulty of Paying Living Expenses: Not on file  Food Insecurity:   . Worried About Charity fundraiser in the Last Year: Not on file  . Ran Out of Food in the Last Year: Not on file  Transportation Needs:   . Lack of Transportation (Medical): Not on file  . Lack of Transportation (Non-Medical): Not on file  Physical Activity:   . Days of Exercise per Week: Not on file  . Minutes of Exercise per Session: Not on file  Stress:   . Feeling of Stress : Not on file  Social Connections:   . Frequency of Communication with Friends and Family: Not on file  . Frequency of Social Gatherings with Friends and Family: Not on file  . Attends Religious Services: Not on file  . Active Member of Clubs or Organizations: Not on file  .  Attends Archivist Meetings: Not on file  . Marital Status: Not on file  Intimate Partner Violence:   . Fear of Current or Ex-Partner: Not on file  . Emotionally Abused: Not on file  . Physically Abused: Not on file  . Sexually Abused: Not on file    Past Medical History, Surgical history, Social history, and Family history were reviewed and updated as appropriate.   Please see review of systems for further details on the patient's review from today.   Objective:  Physical Exam:  There were no vitals taken for this visit.  Physical Exam Constitutional:      General: She is not in acute distress. Musculoskeletal:        General: No deformity.  Neurological:     Mental Status: She is alert and oriented to person, place, and time.     Coordination: Coordination normal.  Psychiatric:        Attention and Perception: Attention and perception normal. She does not perceive auditory or visual hallucinations.        Mood and Affect: Mood normal. Mood is not anxious or depressed. Affect is not labile, blunt, angry or inappropriate.        Speech: Speech normal.        Behavior: Behavior normal.        Thought Content: Thought content normal. Thought content is not paranoid or delusional. Thought content does not include homicidal or suicidal ideation. Thought content does not include homicidal or suicidal plan.        Cognition and Memory: Cognition and memory normal.        Judgment: Judgment normal.     Comments: Insight intact     Lab Review:     Component Value Date/Time   NA 142 09/14/2019 1057   K 4.6 09/14/2019 1057   CL 102 09/14/2019 1057   CO2 26 09/14/2019 1057   GLUCOSE 117 (H) 09/14/2019 1057   GLUCOSE 117 (H) 05/01/2017 0813   BUN 17 09/14/2019 1057   CREATININE 0.79 09/14/2019 1057   CREATININE 0.80 05/01/2017 0813   CALCIUM 9.7 09/14/2019 1057   CALCIUM 8.9 10/13/2012 0500   PROT 6.4 09/14/2019 1057   ALBUMIN 4.3 09/14/2019 1057   AST 19  09/14/2019 1057   ALT 18 09/14/2019 1057   ALKPHOS 71 09/14/2019 1057   BILITOT 0.3 09/14/2019 1057   GFRNONAA 77 09/14/2019 1057   GFRAA 88 09/14/2019 1057       Component Value Date/Time   WBC 4.2 09/14/2019 1057   WBC 4.1 05/01/2017 0813   RBC 3.89 09/14/2019 1057   RBC 3.85 05/01/2017 0813   HGB 11.7 09/14/2019 1057   HCT 36.3 09/14/2019 1057   PLT 220 09/14/2019 1057   MCV 93 09/14/2019 1057   MCH 30.1 09/14/2019 1057   MCH 29.6 05/01/2017 0813   MCHC 32.2 09/14/2019 1057   MCHC 31.9 (L) 05/01/2017 0813   RDW 12.7 09/14/2019 1057   LYMPHSABS 0.8 09/14/2019 1057   MONOABS 287 05/01/2017 0813   EOSABS 0.2 09/14/2019 1057   BASOSABS 0.0 09/14/2019 1057    No results found for: POCLITH, LITHIUM   No results found for: PHENYTOIN, PHENOBARB, VALPROATE, CBMZ   .res Assessment: Plan:   Consider Trazodone if melatonin is not effective.  Will attempt gradual dose reduction of sertraline from 100mg  to 75 mg daily.  Advised patient to contact office if she has any worsening signs and symptoms. Patient to follow-up in 3 months or sooner if clinically indicated. Patient advised to contact office with any questions, adverse effects, or acute worsening in signs and symptoms.   Gracilyn was seen today for follow-up.  Diagnoses and all orders for this visit:  Adjustment disorder with mixed anxiety and depressed mood -     sertraline (ZOLOFT) 50 MG tablet; Take 1.5 tablets (75 mg total) by mouth daily.     Please see After Visit Summary for patient specific instructions.  Future Appointments  Date Time Provider [place] [facility]  01/13/2020  8:00 AM [place] [facility]  03/14/2020  9:45 AM Denita Lung, MD PFM-PFM [place]  03/25/2020  9:30 AM Thayer Headings, PMHNP CP-CP None  12/16/2020  9:15 AM Hayden Pedro, MD TRE-TRE None    No orders of the defined types were placed in this encounter.   -------------------------------

## 2019-12-30 ENCOUNTER — Encounter: Payer: Self-pay | Admitting: Family Medicine

## 2019-12-31 ENCOUNTER — Ambulatory Visit: Payer: Medicare Other | Admitting: Family Medicine

## 2020-01-09 ENCOUNTER — Ambulatory Visit: Payer: Medicare Other | Attending: Internal Medicine

## 2020-01-09 DIAGNOSIS — Z23 Encounter for immunization: Secondary | ICD-10-CM

## 2020-01-09 NOTE — Progress Notes (Signed)
   Covid-19 Vaccination Clinic  Name:  ELASIA VANDEWALKER    MRN: [id number] DOB: [birth year]  01/09/2020  Ms. Dicostanzo was observed post Covid-19 immunization for 15 minutes without incident. She was provided with Vaccine Information Sheet and instruction to access the V-Safe system.   Ms. Tordoff was instructed to call 911 with any severe reactions post vaccine: Marland Kitchen Difficulty breathing  . Swelling of face and throat  . A fast heartbeat  . A bad rash all over body  . Dizziness and weakness   Immunizations Administered    Name Date Dose VIS Date Route   Pfizer COVID-19 Vaccine 01/09/2020 10:24 AM 0.3 mL 10/02/2019 Intramuscular   Manufacturer: [place]   Lot: [id number]   [place]: [id number]

## 2020-01-13 ENCOUNTER — Ambulatory Visit: Payer: Medicare Other

## 2020-01-18 ENCOUNTER — Ambulatory Visit (INDEPENDENT_AMBULATORY_CARE_PROVIDER_SITE_OTHER): Payer: Medicare Other | Admitting: Family Medicine

## 2020-01-18 ENCOUNTER — Encounter: Payer: Self-pay | Admitting: Family Medicine

## 2020-01-18 ENCOUNTER — Other Ambulatory Visit: Payer: Self-pay

## 2020-01-18 VITALS — BP 146/70 | HR 58 | Temp 96.7°F | Ht 67.0 in | Wt 210.0 lb

## 2020-01-18 DIAGNOSIS — J302 Other seasonal allergic rhinitis: Secondary | ICD-10-CM

## 2020-01-18 DIAGNOSIS — R05 Cough: Secondary | ICD-10-CM

## 2020-01-18 DIAGNOSIS — R059 Cough, unspecified: Secondary | ICD-10-CM

## 2020-01-18 NOTE — Progress Notes (Signed)
Start time: 10:54 End time: 11:07  Virtual Visit via Video Note  I connected with [name] on 01/18/20 by a video enabled telemedicine application and verified that I am speaking with the correct person using two identifiers.  Location: Patient: home Provider: office   I discussed the limitations of evaluation and management by telemedicine and the availability of in person appointments. The patient expressed understanding and agreed to proceed.  History of Present Illness:  Chief Complaint  Patient presents with  . Cough    VIRTUAL very hard cough that makes her throat scratchy. Always on her left side of throat. No other symptoms except when she coughs she does cough up some mucus-mucus is creamy in color.    She feels a tickle in her throat, dry throat, which can cause a "hard cough". Symptoms started a couple of weeks ago. She "doesn't feel bad" Sometimes gets up a "milky white" phlegm, somewhat thick. More in the last couple of weeks. Prior to that only happened if talking a lot (like a bible study presentation, mostly related to dry throat). Now it triggers for no reason. She has had similar cough in the past due to allergies, but currently denies runny nose (only when coughing).  She has been outside.   PMH, PSH, SH reviewed  Outpatient Encounter Medications as of 01/18/2020  Medication Sig  . aspirin EC 81 MG tablet Take 81 mg by mouth daily.  Marland Kitchen atorvastatin (LIPITOR) 10 MG tablet TAKE 1 TABLET(10 MG) BY MOUTH DAILY  . calcium carbonate (OS-CAL) 600 MG TABS Take 600 mg by mouth 2 (two) times daily with a meal.    . Cholecalciferol (VITAMIN D) 2000 UNITS tablet Take 2,000 Units by mouth daily.  Marland Kitchen glucose blood (ONE TOUCH ULTRA TEST) test strip USE TO TEST ONCE A DAY AS DIRECTED  . KRILL OIL 1000 MG CAPS Take 1,000 mg by mouth every evening.  Marland Kitchen lisinopril-hydrochlorothiazide (ZESTORETIC) 20-12.5 MG tablet Take 1 tablet by mouth daily.  . metFORMIN (GLUCOPHAGE) 500  MG tablet Take 1 tablet (500 mg total) by mouth 2 (two) times daily with a meal.  . Misc Natural Products (COSAMIN ASU ADVANCED FORMULA) CAPS Take 2 each by mouth 2 (two) times daily.  . Multiple Vitamins-Minerals (MULTIVITAMIN WITH MINERALS) tablet Take 1 tablet by mouth daily.   . Multiple Vitamins-Minerals (PRESERVISION AREDS 2+MULTI VIT PO) Take by mouth.  . pioglitazone (ACTOS) 30 MG tablet TAKE 1 TABLET BY MOUTH DAILY  . sertraline (ZOLOFT) 50 MG tablet Take 1.5 tablets (75 mg total) by mouth daily.  Marland Kitchen acetaminophen (TYLENOL) 325 MG tablet Take 325-650 mg by mouth every 6 (six) hours as needed. Reported on 04/26/2016  . clobetasol (TEMOVATE) 0.05 % external solution APPLY TO SCALP DAILY.  Marland Kitchen Lancets (ONETOUCH ULTRASOFT) lancets USE AS DIRECTED TO TEST ONE TIME DAILY  . loratadine (CLARITIN) 10 MG tablet Take 10 mg by mouth daily as needed for allergies.  . [DISCONTINUED] mupirocin ointment (BACTROBAN) 2 % Place 1 application into the nose 2 (two) times daily.  . [DISCONTINUED] neomycin-polymyxin-hydrocortisone (CORTISPORIN) 3.5-10000-1 OTIC suspension Place 3 drops into both ears 3 (three) times daily.  . [DISCONTINUED] sulfamethoxazole-trimethoprim (BACTRIM DS) 800-160 MG tablet Take 1 tablet by mouth 2 (two) times daily.  . [DISCONTINUED] triamcinolone cream (KENALOG) 0.1 % Apply 1 application topically 2 (two) times daily.   No facility-administered encounter medications on file as of 01/18/2020.   No Known Allergies  ROS:  No fever or chills, no loss of  taste or smell.Denies DOE.  No nausea, vomiting, diarrhea.  No rashes or bleeding. No body aches or sick contacts. No chest pain, shortness of breath. BP's are usually in the 130's, was rushing this morning.   Observations/Objective:  BP (!) 146/70   Pulse (!) 58   Temp (!) 96.7 F (35.9 C) (Oral)   Ht 5\' 7"  (1.702 m)   Wt 210 lb (95.3 kg)   BMI 32.89 kg/m   Pleasant, well-appearing female in no distress. No coughing during  visit, speaking easily and comfortably. HEENT: conjunctiva and sclera are clear. Cranial nerves grossly intact. Normal mood, affect, speech, eye contact, grooming   Assessment and Plan:  Seasonal allergies  Cough   Drink plenty of water. Start taking Claritin once daily and continue through allergy season. I recommend taking the expectorant guaifenesin to loosen up the mucus. You may use this in combination with dextromethorphan, which is the cough suppressant usually found in the DM medications. My recommendation is to take the Mucinex DM 12 hour tablet twice daily. Versus getting a shorter-acting or a liquid (sugar-free).  Please contact us if you develop fever, discolored mucus or phelgm, sinus pain, shortness of breath or any other change in symptoms.    Follow Up Instructions:    I discussed the assessment and treatment plan with the patient. The patient was provided an opportunity to ask questions and all were answered. The patient agreed with the plan and demonstrated an understanding of the instructions.   The patient was advised to call back or seek an in-person evaluation if the symptoms worsen or if the condition fails to improve as anticipated.  I provided 14 minutes of non-face-to-face time during this encounter. Additional time spent in chart review and documentation.   Vikki Ports, MD

## 2020-01-18 NOTE — Patient Instructions (Signed)
Drink plenty of water. Start taking Claritin once daily and continue through allergy season. I recommend taking the expectorant guaifenesin to loosen up the mucus. You may use this in combination with dextromethorphan, which is the cough suppressant usually found in the DM medications. My recommendation is to take the Mucinex DM 12 hour tablet twice daily. Versus getting a shorter-acting or a liquid (sugar-free).  Please contact us if you develop fever, discolored mucus or phelgm, sinus pain, shortness of breath or any other change in symptoms.

## 2020-01-26 ENCOUNTER — Other Ambulatory Visit: Payer: Self-pay | Admitting: Family Medicine

## 2020-01-26 DIAGNOSIS — E1159 Type 2 diabetes mellitus with other circulatory complications: Secondary | ICD-10-CM

## 2020-01-26 DIAGNOSIS — E118 Type 2 diabetes mellitus with unspecified complications: Secondary | ICD-10-CM

## 2020-01-31 DIAGNOSIS — R220 Localized swelling, mass and lump, head: Secondary | ICD-10-CM | POA: Diagnosis not present

## 2020-01-31 DIAGNOSIS — R509 Fever, unspecified: Secondary | ICD-10-CM | POA: Diagnosis not present

## 2020-01-31 DIAGNOSIS — L03211 Cellulitis of face: Secondary | ICD-10-CM | POA: Diagnosis not present

## 2020-01-31 DIAGNOSIS — H9202 Otalgia, left ear: Secondary | ICD-10-CM | POA: Diagnosis not present

## 2020-02-01 ENCOUNTER — Encounter: Payer: Self-pay | Admitting: Family Medicine

## 2020-02-01 ENCOUNTER — Other Ambulatory Visit: Payer: Self-pay

## 2020-02-01 ENCOUNTER — Ambulatory Visit (INDEPENDENT_AMBULATORY_CARE_PROVIDER_SITE_OTHER): Payer: Medicare Other | Admitting: Family Medicine

## 2020-02-01 VITALS — BP 154/84 | HR 89 | Temp 98.6°F | Wt 215.4 lb

## 2020-02-01 DIAGNOSIS — L03211 Cellulitis of face: Secondary | ICD-10-CM | POA: Diagnosis not present

## 2020-02-01 NOTE — Progress Notes (Signed)
   Subjective:    Patient ID: [name], female    DOB: [birth year], 69 y.o.   MRN: [id number]  HPI She is here for recheck on recent emergency room visit.  Last Friday she developed left earache and I recommended that she take pain meds for that.  The next day she developed a rash on her face as well as some slight itching and yesterday was seen in an urgent care center with swelling to her face.  The emergency room record was reviewed and it did show that they treated her with Keflex as well as putting her on prednisone for treatment of cellulitis.  She is here for follow-up on that.   Review of Systems     Objective:   Physical Exam Alert and in no distress.  Left canal was swollen and erythematous and the TM appeared erythematous.  Right canal and TM normal.  Exam of the face does show swelling and erythema in an irregular pattern.  Neck is supple without adenopathy.       Assessment & Plan:  Cellulitis of face I recommended that she continue on her present dosing of Keflex as well as the prednisone.  Hopefully this will take care of it.  If she has continued difficulty she will call me.

## 2020-02-12 ENCOUNTER — Other Ambulatory Visit: Payer: Self-pay

## 2020-02-12 ENCOUNTER — Encounter (HOSPITAL_COMMUNITY): Payer: Self-pay | Admitting: Emergency Medicine

## 2020-02-12 ENCOUNTER — Ambulatory Visit (HOSPITAL_COMMUNITY)
Admission: EM | Admit: 2020-02-12 | Discharge: 2020-02-12 | Discharge status: Against Medical Advice | Payer: Medicare Other | Source: Home / Self Care

## 2020-02-12 ENCOUNTER — Emergency Department (HOSPITAL_COMMUNITY)
Admission: EM | Admit: 2020-02-12 | Discharge: 2020-02-12 | Disposition: A | Discharge status: Home / Self Care | Payer: Medicare Other | Attending: Emergency Medicine | Admitting: Emergency Medicine

## 2020-02-12 ENCOUNTER — Emergency Department (HOSPITAL_COMMUNITY): Payer: Medicare Other

## 2020-02-12 DIAGNOSIS — Y998 Other external cause status: Secondary | ICD-10-CM | POA: Diagnosis not present

## 2020-02-12 DIAGNOSIS — S0990XA Unspecified injury of head, initial encounter: Secondary | ICD-10-CM | POA: Diagnosis present

## 2020-02-12 DIAGNOSIS — E119 Type 2 diabetes mellitus without complications: Secondary | ICD-10-CM | POA: Insufficient documentation

## 2020-02-12 DIAGNOSIS — Y93H2 Activity, gardening and landscaping: Secondary | ICD-10-CM | POA: Insufficient documentation

## 2020-02-12 DIAGNOSIS — S0083XA Contusion of other part of head, initial encounter: Secondary | ICD-10-CM | POA: Diagnosis not present

## 2020-02-12 DIAGNOSIS — M546 Pain in thoracic spine: Secondary | ICD-10-CM | POA: Insufficient documentation

## 2020-02-12 DIAGNOSIS — Z7984 Long term (current) use of oral hypoglycemic drugs: Secondary | ICD-10-CM | POA: Diagnosis not present

## 2020-02-12 DIAGNOSIS — Z7982 Long term (current) use of aspirin: Secondary | ICD-10-CM | POA: Insufficient documentation

## 2020-02-12 DIAGNOSIS — Z85828 Personal history of other malignant neoplasm of skin: Secondary | ICD-10-CM | POA: Insufficient documentation

## 2020-02-12 DIAGNOSIS — I10 Essential (primary) hypertension: Secondary | ICD-10-CM | POA: Insufficient documentation

## 2020-02-12 DIAGNOSIS — W01198A Fall on same level from slipping, tripping and stumbling with subsequent striking against other object, initial encounter: Secondary | ICD-10-CM | POA: Diagnosis not present

## 2020-02-12 DIAGNOSIS — Y92017 Garden or yard in single-family (private) house as the place of occurrence of the external cause: Secondary | ICD-10-CM | POA: Diagnosis not present

## 2020-02-12 DIAGNOSIS — Z79899 Other long term (current) drug therapy: Secondary | ICD-10-CM | POA: Diagnosis not present

## 2020-02-12 NOTE — ED Triage Notes (Signed)
Pt states she was in her backyard pulling a wagon, when she fell. Denies LOC, bruising and swelling noted to her forehead and right knee. Verbal order for CT head from Dr. Tyrone Nine.

## 2020-02-12 NOTE — ED Notes (Signed)
Pt is alert and oriented, denies any syncope before fall or LOC with fall.  Bruise to left forehead.  CT WNL.

## 2020-02-12 NOTE — Discharge Instructions (Signed)
You will hurt worse tomorrow.  This is normal.  Take Tylenol as needed for pain.  Please return for shortness of breath or abdominal discomfort.  Please return for confusion persistent vomiting or one-sided numbness or weakness.

## 2020-02-12 NOTE — ED Provider Notes (Signed)
[facility] EMERGENCY DEPARTMENT Provider Note   CSN: [id number] Arrival date & time: 02/12/20  1624     History Chief Complaint  Patient presents with  . Fall    [name] is a 69 y.o. female.  69 yo F with a chief complaints of a fall.  The patient was helping her husband do some yard work and he was pulling a very heavy wagon and things and it shifted and started to fall out and when she tried to help she lost her balance and fell and struck her head.  She initially had some pain to her right hand and right knee as well but this is resolved.  She has some mild midthoracic back pain as well though she describes this is minimal.  She denies chest pain or shortness of breath denies confusion denies vomiting.  The history is provided by the patient.  Fall This is a new problem. The current episode started 3 to 5 hours ago. The problem occurs rarely. The problem has been resolved. Associated symptoms include headaches. Pertinent negatives include no chest pain, no abdominal pain and no shortness of breath. Nothing aggravates the symptoms. Nothing relieves the symptoms. She has tried nothing for the symptoms. The treatment provided no relief.       Past Medical History:  Diagnosis Date  . Basal cell carcinoma of nose 11/30/13   [facility]   . Diabetes mellitus   . Dyslipidemia   . Hyperlipidemia   . Hypertension   . Obesity   . Obesity     Patient Active Problem List   Diagnosis Date Noted  . Glaucoma 10/26/2019  . Anxiety and depression 05/11/2019  . S/P cataract extraction 01/06/2018  . Narrow angle glaucoma suspect 01/11/2016  . Allergic rhinitis, seasonal 06/30/2012  . Controlled diabetes mellitus with complication ([place]) [id number]  . Hypertension associated with diabetes ([employer]) 06/27/2011  . Hyperlipidemia associated with type 2 diabetes mellitus ([place]) 06/27/2011  . Morbid obesity ([place]) 06/27/2011    Past Surgical History:    Procedure Laterality Date  . ABDOMINAL HYSTERECTOMY    . fractured patella repair  02/2011   Dr. Theda Sers; R knee  . ORIF TIBIA FRACTURE    . ORIF TIBIA PLATEAU  10/12/2012   Procedure: OPEN REDUCTION INTERNAL FIXATION (ORIF) TIBIAL PLATEAU;  Surgeon: Rozanna Box, MD;  Location: [facility];  Service: Orthopedics;  Laterality: Right;  . ORIF WRIST FRACTURE  11/03/2012   Procedure: OPEN REDUCTION INTERNAL FIXATION (ORIF) WRIST FRACTURE;  Surgeon: Rozanna Box, MD;  Location: [facility];  Service: Orthopedics;  Laterality: Left;  . TOTAL ABDOMINAL HYSTERECTOMY W/ BILATERAL SALPINGOOPHORECTOMY     fibroids     OB History    Gravida  3   Para  2   Term      Preterm      AB  1   Living  2     SAB  1   TAB      Ectopic      Multiple      Live Births              Family History  Problem Relation Age of Onset  . Asthma Other   . Hypertension Other   . Diabetes Other     Social History   Tobacco Use  . Smoking status: Never Smoker  . Smokeless tobacco: Never Used  Substance Use Topics  . Alcohol use: No  . Drug  use: No    Home Medications Prior to Admission medications   Medication Sig Start Date End Date Taking? Authorizing Provider  acetaminophen (TYLENOL) 325 MG tablet Take 325-650 mg by mouth every 6 (six) hours as needed. Reported on 04/26/2016 10/13/12   Ainsley Spinner, PA-C  aspirin EC 81 MG tablet Take 81 mg by mouth daily.    [provider]  atorvastatin (LIPITOR) 10 MG tablet TAKE 1 TABLET(10 MG) BY MOUTH DAILY 07/06/19   Denita Lung, MD  calcium carbonate (OS-CAL) 600 MG TABS Take 600 mg by mouth 2 (two) times daily with a meal.      [provider]  Cholecalciferol (VITAMIN D) 2000 UNITS tablet Take 2,000 Units by mouth daily.    [provider]  clobetasol (TEMOVATE) 0.05 % external solution APPLY TO SCALP DAILY. 11/21/15   [provider]  glucose blood (ONE TOUCH ULTRA TEST) test strip USE TO TEST ONCE A DAY AS  DIRECTED 12/30/18   Denita Lung, MD  KRILL OIL 1000 MG CAPS Take 1,000 mg by mouth every evening.    [provider]  Lancets [facility] ULTRASOFT) lancets USE AS DIRECTED TO TEST ONCE DAILY 01/27/20   Denita Lung, MD  lisinopril-hydrochlorothiazide (ZESTORETIC) 20-12.5 MG tablet TAKE 1 TABLET BY MOUTH DAILY 01/27/20   Denita Lung, MD  loratadine (CLARITIN) 10 MG tablet Take 10 mg by mouth daily as needed for allergies.    [provider]  metFORMIN (GLUCOPHAGE) 500 MG tablet Take 1 tablet (500 mg total) by mouth 2 (two) times daily with a meal. 09/14/19   Denita Lung, MD  Misc Natural Products (COSAMIN ASU ADVANCED FORMULA) CAPS Take 2 each by mouth 2 (two) times daily.    [provider]  Multiple Vitamins-Minerals (MULTIVITAMIN WITH MINERALS) tablet Take 1 tablet by mouth daily.     [provider]  Multiple Vitamins-Minerals (PRESERVISION AREDS 2+MULTI VIT PO) Take by mouth.    [provider]  pioglitazone (ACTOS) 30 MG tablet TAKE 1 TABLET BY MOUTH DAILY 01/27/20   Denita Lung, MD  sertraline (ZOLOFT) 50 MG tablet Take 1.5 tablets (75 mg total) by mouth daily. 12/24/19 03/23/20  Thayer Headings, [place]    Allergies    Patient has no known allergies.  Review of Systems   Review of Systems  Constitutional: Negative for chills and fever.  HENT: Negative for congestion and rhinorrhea.   Eyes: Negative for redness and visual disturbance.  Respiratory: Negative for shortness of breath and wheezing.   Cardiovascular: Negative for chest pain and palpitations.  Gastrointestinal: Negative for abdominal pain, nausea and vomiting.  Genitourinary: Negative for dysuria and urgency.  Musculoskeletal: Positive for arthralgias and myalgias.  Skin: Negative for pallor and wound.  Neurological: Positive for headaches. Negative for dizziness.    Physical Exam Updated Vital Signs BP (!) 138/59   Pulse 62   Temp 98.4 F (36.9 C) (Oral)   Resp 16    Ht 5\' 4"  (1.626 m)   Wt 95.3 kg   SpO2 98%   BMI 36.05 kg/m   Physical Exam Vitals and nursing note reviewed.  Constitutional:      General: She is not in acute distress.    Appearance: She is well-developed. She is not diaphoretic.  HENT:     Head: Normocephalic.     Comments: Contusion to the right frontal region.  No midline C-spine tenderness able to rotate her head 45 degrees in either direction without  pain. Eyes:     Pupils: Pupils are equal, round, and reactive to light.  Cardiovascular:     Rate and Rhythm: Normal rate and regular rhythm.     Heart sounds: No murmur. No friction rub. No gallop.   Pulmonary:     Effort: Pulmonary effort is normal.     Breath sounds: No wheezing or rales.  Abdominal:     General: There is no distension.     Palpations: Abdomen is soft.     Tenderness: There is no abdominal tenderness.  Musculoskeletal:        General: Tenderness present.     Cervical back: Normal range of motion and neck supple.     Comments: Very mild mid thoracic back tenderness about T10.  Otherwise palpated from head to toe without any other noted areas of bony tenderness  Skin:    General: Skin is warm and dry.  Neurological:     Mental Status: She is alert and oriented to person, place, and time.  Psychiatric:        Behavior: Behavior normal.     ED Results / Procedures / Treatments   Labs (all labs ordered are listed, but only abnormal results are displayed) Labs Reviewed - No data to display  EKG None  Radiology CT Head Wo Contrast  Result Date: 02/12/2020 CLINICAL DATA:  Fall, head trauma with headache. Was in backyard pulling a wagon leading to fall. No loss of consciousness. Bruising and swelling to forehead. EXAM: CT HEAD WITHOUT CONTRAST TECHNIQUE: Contiguous axial images were obtained from the base of the skull through the vertex without intravenous contrast. COMPARISON:  None. FINDINGS: Brain: No intracranial hemorrhage, mass effect, or  midline shift. No hydrocephalus. The basilar cisterns are patent. No evidence of territorial infarct or acute ischemia. No extra-axial or intracranial fluid collection. Vascular: Atherosclerosis of skullbase vasculature without hyperdense vessel or abnormal calcification. Skull: No fracture or focal lesion. Sinuses/Orbits: Paranasal sinuses and mastoid air cells are clear. The visualized orbits are unremarkable. Bilateral cataract resection. Other: Right supraorbital scalp hematoma. IMPRESSION: Right supraorbital scalp hematoma. No acute intracranial abnormality. No skull fracture. Electronically Signed   By: Keith Rake M.D.   On: 02/12/2020 17:59    Procedures Procedures (including critical care time)  Medications Ordered in ED Medications - No data to display  ED Course  I have reviewed the triage vital signs and the nursing notes.  Pertinent labs & imaging results that were available during my care of the patient were reviewed by me and considered in my medical decision making (see chart for details).    MDM Rules/Calculators/A&P                      69 yo  F with a chief complaints of a fall.  Patient struck her head.  Had a CT scan done in triage is negative for acute intracranial pathology.  She has some mild midthoracic back pain.  Offered imaging which she is declining.  Will discharge home.  PCP follow-up.  10:13 PM:  I have discussed the diagnosis/risks/treatment options with the patient and believe the pt to be eligible for discharge home to follow-up with PCP. We also discussed returning to the ED immediately if new or worsening sx occur. We discussed the sx which are most concerning (e.g., sudden worsening pain, fever, inability to tolerate by mouth) that necessitate immediate return. Medications administered to the patient during their visit and any new prescriptions provided to  the patient are listed below.  Medications given during this visit Medications - No data to  display   The patient appears reasonably screen and/or stabilized for discharge and I doubt any other medical condition or other [facility] requiring further screening, evaluation, or treatment in the ED at this time prior to discharge.   Final Clinical Impression(s) / ED Diagnoses Final diagnoses:  Contusion of face, initial encounter    Rx / DC Orders ED Discharge Orders    None       Deno Etienne, DO 02/12/20 2213

## 2020-02-12 NOTE — ED Notes (Signed)
Sign-pad unavailable upon discharge, pt provided with and verbalizes understanding of discharge instructions. A&ox4 upon departure. Wrist band removed.

## 2020-02-15 ENCOUNTER — Encounter: Payer: Self-pay | Admitting: Family Medicine

## 2020-02-15 ENCOUNTER — Other Ambulatory Visit: Payer: Self-pay

## 2020-02-15 ENCOUNTER — Ambulatory Visit (INDEPENDENT_AMBULATORY_CARE_PROVIDER_SITE_OTHER): Payer: Medicare Other | Admitting: Family Medicine

## 2020-02-15 VITALS — BP 142/70 | HR 63 | Temp 97.1°F | Wt 213.4 lb

## 2020-02-15 DIAGNOSIS — T07XXXA Unspecified multiple injuries, initial encounter: Secondary | ICD-10-CM | POA: Diagnosis not present

## 2020-02-15 NOTE — Progress Notes (Signed)
   Subjective:    Patient ID: [name], female    DOB: 04-Jun-1951, 69 y.o.   MRN: [id number]  HPI She is here for a follow-up visit after recent emergency room visit for a fall.  The emergency room record was reviewed.  Presently she does complain of slight discomfort in the right frontal area as well as mid abdominal area.   Review of Systems     Objective:   Physical Exam Alert and in no distress.  Contusion noted to the right frontal area.  The eye appears totally normal with no conjunctival injection or swelling.  EOMI.  Abdominal exam does show a bruise in the right upper quadrant but no abdominal tenderness or rebound.       Assessment & Plan:  Multiple contusions Reassured her that she is in not in any danger.  Recommend conservative care with Tylenol for the pain and call if further trouble.  She was comfortable with that.

## 2020-02-16 DIAGNOSIS — S0083XA Contusion of other part of head, initial encounter: Secondary | ICD-10-CM | POA: Diagnosis not present

## 2020-02-17 ENCOUNTER — Other Ambulatory Visit: Payer: Self-pay | Admitting: Family Medicine

## 2020-02-17 ENCOUNTER — Other Ambulatory Visit: Payer: Self-pay

## 2020-02-17 DIAGNOSIS — F4323 Adjustment disorder with mixed anxiety and depressed mood: Secondary | ICD-10-CM

## 2020-02-17 DIAGNOSIS — E1159 Type 2 diabetes mellitus with other circulatory complications: Secondary | ICD-10-CM

## 2020-02-17 MED ORDER — SERTRALINE HCL 50 MG PO TABS: 75.0000 mg | ORAL_TABLET | Freq: Every day | ORAL | 0 refills | Status: DC

## 2020-03-14 ENCOUNTER — Other Ambulatory Visit: Payer: Self-pay

## 2020-03-14 ENCOUNTER — Encounter: Payer: Self-pay | Admitting: Family Medicine

## 2020-03-14 ENCOUNTER — Ambulatory Visit (INDEPENDENT_AMBULATORY_CARE_PROVIDER_SITE_OTHER): Payer: Medicare Other | Admitting: Family Medicine

## 2020-03-14 VITALS — BP 130/72 | HR 69 | Temp 98.8°F | Ht 63.0 in | Wt 212.2 lb

## 2020-03-14 DIAGNOSIS — E118 Type 2 diabetes mellitus with unspecified complications: Secondary | ICD-10-CM

## 2020-03-14 DIAGNOSIS — E1169 Type 2 diabetes mellitus with other specified complication: Secondary | ICD-10-CM

## 2020-03-14 DIAGNOSIS — I10 Essential (primary) hypertension: Secondary | ICD-10-CM

## 2020-03-14 DIAGNOSIS — R319 Hematuria, unspecified: Secondary | ICD-10-CM

## 2020-03-14 DIAGNOSIS — F32A Depression, unspecified: Secondary | ICD-10-CM

## 2020-03-14 DIAGNOSIS — R9431 Abnormal electrocardiogram [ECG] [EKG]: Secondary | ICD-10-CM | POA: Diagnosis not present

## 2020-03-14 DIAGNOSIS — H409 Unspecified glaucoma: Secondary | ICD-10-CM

## 2020-03-14 DIAGNOSIS — Z9849 Cataract extraction status, unspecified eye: Secondary | ICD-10-CM

## 2020-03-14 DIAGNOSIS — F329 Major depressive disorder, single episode, unspecified: Secondary | ICD-10-CM

## 2020-03-14 DIAGNOSIS — E1159 Type 2 diabetes mellitus with other circulatory complications: Secondary | ICD-10-CM

## 2020-03-14 DIAGNOSIS — F4323 Adjustment disorder with mixed anxiety and depressed mood: Secondary | ICD-10-CM

## 2020-03-14 DIAGNOSIS — F419 Anxiety disorder, unspecified: Secondary | ICD-10-CM

## 2020-03-14 DIAGNOSIS — E785 Hyperlipidemia, unspecified: Secondary | ICD-10-CM

## 2020-03-14 DIAGNOSIS — Z0000 Encounter for general adult medical examination without abnormal findings: Secondary | ICD-10-CM | POA: Diagnosis not present

## 2020-03-14 LAB — POCT URINALYSIS DIP (PROADVANTAGE DEVICE)
Bilirubin, UA: NEGATIVE
Glucose, UA: NEGATIVE mg/dL
Ketones, POC UA: NEGATIVE mg/dL
Nitrite, UA: NEGATIVE
Protein Ur, POC: NEGATIVE mg/dL
Specific Gravity, Urine: 1.015
Urobilinogen, Ur: 0.2
pH, UA: 7 (ref 5.0–8.0)

## 2020-03-14 LAB — POCT GLYCOSYLATED HEMOGLOBIN (HGB A1C): Hemoglobin A1C: 6.8 % — AB (ref 4.0–5.6)

## 2020-03-14 MED ORDER — METFORMIN HCL 500 MG PO TABS: 500.0000 mg | ORAL_TABLET | Freq: Two times a day (BID) | ORAL | 1 refills | Status: DC

## 2020-03-14 MED ORDER — SERTRALINE HCL 50 MG PO TABS: 75.0000 mg | ORAL_TABLET | Freq: Every day | ORAL | 0 refills | Status: DC

## 2020-03-14 MED ORDER — SULFAMETHOXAZOLE-TRIMETHOPRIM 800-160 MG PO TABS: 1.0000 | ORAL_TABLET | Freq: Two times a day (BID) | ORAL | 0 refills | Status: DC

## 2020-03-14 MED ORDER — LISINOPRIL-HYDROCHLOROTHIAZIDE 20-12.5 MG PO TABS: 1.0000 | ORAL_TABLET | Freq: Every day | ORAL | 0 refills | Status: DC

## 2020-03-14 MED ORDER — PIOGLITAZONE HCL 30 MG PO TABS: 30.0000 mg | ORAL_TABLET | Freq: Every day | ORAL | 0 refills | Status: DC

## 2020-03-14 MED ORDER — ATORVASTATIN CALCIUM 10 MG PO TABS: 10.0000 mg | ORAL_TABLET | Freq: Every day | ORAL | 3 refills | Status: DC

## 2020-03-14 NOTE — Progress Notes (Signed)
Jill Alexanders, MD   03/14/2020  [name] is a 69 y.o. female who presents for annual wellness visit,CPE and follow-up on chronic medical conditions.  She has no particular concerns or complaints.  She continues on lisinopril/HCTZ and having no difficulty with that.  She also is taking Actos and Metformin without a problem.  She continues on sertraline which is helping keep her emotions in good control.  Her allergies are causing no difficulties.  Her exercise is minimal although she has lost a few pounds since her last visit.  She does get her eyes checked regularly and does have previous history of cataract surgery.  She also has glaucoma.  She continues on atorvastatin and is having no difficulty with that.  Immunizations and Health Maintenance Immunization History  Administered Date(s) Administered  . Fluad Quad(high Dose 65+) 09/14/2019  . Influenza Split 06/30/2012, 08/02/2013  . Influenza Whole 06/27/2011  . Influenza, High Dose Seasonal PF 09/13/2015, 08/28/2016, 07/01/2017, 09/11/2018  . Influenza,inj,Quad PF,6+ Mos 06/10/2014  . PFIZER SARS-COV-2 Vaccination 12/19/2019, 01/09/2020  . Pneumococcal Conjugate-13 09/07/2014, 05/01/2017  . Pneumococcal Polysaccharide-23 02/28/2012  . Tdap 01/23/2007, 05/01/2017  . Zoster 01/23/2007  . Zoster Recombinat (Shingrix) 08/01/2017, 12/19/2017   Health Maintenance Due  Topic Date Due  . HEMOGLOBIN A1C  03/13/2020    Last Pap smear: aged out  Last mammogram: 08-27-2019 Last colonoscopy: 12-11-19 Last DEXA: 05-16-15 Dentist: Q six months Ophtho: 2-3 times a year Exercise: staying active  Other doctors caring for patient include: Dr. Katy Fitch eye, Dr Zigmund Daniel diabetic eye,   Advanced directives: Not on file     Depression screen:  See questionnaire below.  Depression screen [facility] 2/9 12/30/2018 05/12/2018 12/26/2016 09/13/2015 06/30/2014  Decreased Interest 0 0 0 0 0  Down, Depressed, Hopeless 0 0 0 0 0  PHQ - 2 Score 0 0 0 0 0     Fall Risk Screen: see questionnaire below. Fall Risk  12/30/2018 05/22/2018 05/12/2018 12/26/2016 09/13/2015  Falls in the past year? 0 No No No No  Comment - [place]i Telephone Survey: data to providers prior to load - - -    ADL screen:  See questionnaire below Functional Status Survey:     Review of Systems Constitutional: -, -unexpected weight change, -anorexia, -fatigue Allergy: -sneezing, -itching, -congestion Dermatology: denies changing moles, rash, lumps ENT: -runny nose, -ear pain, -sore throat,  Cardiology:  -chest pain, -palpitations, -orthopnea, Respiratory: -cough, -shortness of breath, -dyspnea on exertion, -wheezing,  Gastroenterology: -abdominal pain, -nausea, -vomiting, -diarrhea, -constipation, -dysphagia Hematology: -bleeding or bruising problems Musculoskeletal: -arthralgias, -myalgias, -joint swelling, -back pain, - Ophthalmology: -vision changes,  Urology: -dysuria, -difficulty urinating,  -urinary frequency, -urgency, incontinence Neurology: -, -numbness, , -memory loss, -falls, -dizziness    PHYSICAL EXAM:  General Appearance: Alert, cooperative, no distress, appears stated age Head: Normocephalic, without obvious abnormality, atraumatic Eyes: PERRL, conjunctiva/corneas clear, EOM's intact. Ears: Normal TM's and external ear canals Nose: Nares normal, mucosa normal, no drainage or sinus tenderness Throat: Lips, mucosa, and tongue normal; teeth and gums normal Neck: Supple, no lymphadenopathy;  thyroid:  no enlargement/tenderness/nodules; no carotid bruit or JVD Lungs: Clear to auscultation bilaterally without wheezes, rales or ronchi; respirations unlabored Heart: Irregular rate and rhythm, S1 and S2 normal, no murmur, rubor gallop Abdomen: Soft, non-tender, nondistended, normoactive bowel sounds,  no masses, no hepatosplenomegaly Extremities: No clubbing, cyanosis or edema Pulses: 2+ and symmetric all extremities Skin:  Skin color, texture, turgor  normal, no rashes or lesions Lymph  nodes: Cervical, supraclavicular, and axillary nodes normal Neurologic:  CNII-XII intact, normal strength, sensation and gait; reflexes 2+ and symmetric throughout Psych: Normal mood, affect, hygiene and grooming. 2 concurrent EKGs were taken.  One showed a PVC with a sinus pause, the other 1 showed no PVCs but possible junctional rhythm. Urine dipstick was positive for red cells.  Microscopic showed crenated red cells, TNTC. Hemoglobin A1c 6.8 ASSESSMENT/PLAN: Routine general medical examination at a health care facility  Abnormal EKG - Plan: EKG 12-Lead, Ambulatory referral to Cardiology  Hematuria, unspecified type - Plan: POCT Urinalysis DIP (Proadvantage Device), Urine Culture, sulfamethoxazole-trimethoprim (BACTRIM DS) 800-160 MG tablet  Type 2 diabetes mellitus with morbid obesity ([place]) - Plan: POCT glycosylated hemoglobin (Hb A1C)  Status post cataract extraction, unspecified laterality  Hypertension associated with diabetes ([place]) - Plan: lisinopril-hydrochlorothiazide (ZESTORETIC) 20-12.5 MG tablet  Hyperlipidemia associated with type 2 diabetes mellitus ([place]) - Plan: atorvastatin (LIPITOR) 10 MG tablet  Controlled type 2 diabetes mellitus with complication, without long-term current use of insulin ([place]) - Plan: pioglitazone (ACTOS) 30 MG tablet  Anxiety and depression  Glaucoma, unspecified glaucoma type, unspecified laterality  Type 2 diabetes mellitus with complication, without long-term current use of insulin ([place]) - Plan: metFORMIN (GLUCOPHAGE) 500 MG tablet  Adjustment disorder with mixed anxiety and depressed mood - Plan: sertraline (ZOLOFT) 50 MG tablet I complemented her on her weight loss and encouraged her to become more physically active.  Explained that her hemoglobin A1c has gone up slightly.  Encouraged her to make further changes in her diet. I will treat her for presumed UTI because of hematuria but get her back in 2 weeks  for recheck and if still positive for red cells, refer to urology. Immunization recommendations discussed.  Colonoscopy recommendations reviewed   Medicare Attestation I have personally reviewed: The patient's medical and social history Their use of alcohol, tobacco or illicit drugs Their current medications and supplements The patient's functional ability including ADLs,fall risks, home safety risks, cognitive, and hearing and visual impairment Diet and physical activities Evidence for depression or mood disorders  The patient's weight, height, and BMI have been recorded in the chart.  I have made referrals, counseling, and provided education to the patient based on review of the above and I have provided the patient with a written personalized care plan for preventive services.     Jill Alexanders, MD   03/14/2020

## 2020-03-14 NOTE — Patient Instructions (Signed)
  Ms. Burdine , Thank you for taking time to come for your Medicare Wellness Visit. I appreciate your ongoing commitment to your health goals. Please review the following plan we discussed and let me know if I can assist you in the future.   These are the goals we discussed: Continue present medications.  Refer to cardiology.  Follow-up here in 2 weeks on urinalysis. This is a list of the screening recommended for you and due dates:  Health Maintenance  Topic Date Due  . Flu Shot  05/22/2020  . Complete foot exam   09/13/2020  . Hemoglobin A1C  09/14/2020  . Eye exam for diabetics  12/16/2020  . Mammogram  08/26/2021  . Tetanus Vaccine  05/02/2027  . Colon Cancer Screening  12/10/2029  . DEXA scan (bone density measurement)  Completed  . COVID-19 Vaccine  Completed  .  Hepatitis C: One time screening is recommended by Center for Disease Control  (CDC) for  adults born from 18 through 1966.   Completed  . Pneumonia vaccines  Discontinued

## 2020-03-16 LAB — URINE CULTURE

## 2020-03-25 ENCOUNTER — Other Ambulatory Visit: Payer: Self-pay

## 2020-03-25 ENCOUNTER — Ambulatory Visit (INDEPENDENT_AMBULATORY_CARE_PROVIDER_SITE_OTHER): Payer: Medicare Other | Admitting: Psychiatry

## 2020-03-25 ENCOUNTER — Encounter: Payer: Self-pay | Admitting: Psychiatry

## 2020-03-25 DIAGNOSIS — G4700 Insomnia, unspecified: Secondary | ICD-10-CM | POA: Diagnosis not present

## 2020-03-25 DIAGNOSIS — F4323 Adjustment disorder with mixed anxiety and depressed mood: Secondary | ICD-10-CM | POA: Diagnosis not present

## 2020-03-25 MED ORDER — TRAZODONE HCL 100 MG PO TABS: ORAL_TABLET | ORAL | 1 refills | Status: DC

## 2020-03-25 NOTE — Progress Notes (Signed)
[name] [id number] 1951-07-14 69 y.o.  Subjective:   Patient ID:  [name] is a 69 y.o. (DOB Nov 14, 1950) female.  Chief Complaint:  Chief Complaint  Patient presents with   Follow-up    h/o Anxiety and Dperession    HPI [name] presents to the office today for follow-up of adjustment disorder. She reports that her mood has been good overall. She reports that she has occasional sadness and frustration and has learned to walk away when this occurs and try again later. Denies anxiety. Denies tearfulness. She reports that she has thought about resuming melatonin due to early morning/middle of night awakenings. Some nights she has difficulty returning to sleep. Denies anxious thoughts with awakenings. Takes naps daily for about 1 hour. Sleeping about 5 hours at night. Appetite has been good. Energy and motivation vary, typically in response to amount of sleep. Concentration has been adequate. Denies anhedonia. Denies SI.   Enjoys playing games. Has done some gardening.   She reports that her husband's brothers came for visit over [place] day.  Became a great-grandmother 11/25/19. Has been able to see the baby every few weeks.   Past Psychiatric Medication Trials: Sertraline Melatonin   PHQ2-9     Office Visit from 03/14/2020 in [place] Visit from 12/30/2018 in [place] Visit from 05/12/2018 in [place] Visit from 12/26/2016 in [place] Visit from 09/13/2015 in [place]  PHQ-2 Total Score  0  0  0  0  0       Review of Systems:  Review of Systems  Cardiovascular: Positive for palpitations.       Has noticed recent heart flutter and had abnormal EKG at PCP's. Has apt with cardiologist on Tuesday.   Gastrointestinal: Negative.   Musculoskeletal: Negative for gait problem.  Neurological: Negative for tremors.  Psychiatric/Behavioral:       Please refer to HPI     Had a fall in her yard down a hill when trying to prevent a wagon from tipping over. She hit her head on a tree root and had a black eye. Also had large bruise to her torso.   Medications: I have reviewed the patient's current medications.  Current Outpatient Medications  Medication Sig Dispense Refill   acetaminophen (TYLENOL) 325 MG tablet Take 325-650 mg by mouth every 6 (six) hours as needed. Reported on 04/26/2016     aspirin EC 81 MG tablet Take 81 mg by mouth daily.     atorvastatin (LIPITOR) 10 MG tablet Take 1 tablet (10 mg total) by mouth daily. 90 tablet 3   calcium carbonate (OS-CAL) 600 MG TABS Take 600 mg by mouth 2 (two) times daily with a meal.       Cholecalciferol (VITAMIN D) 2000 UNITS tablet Take 2,000 Units by mouth daily.     clobetasol (TEMOVATE) 0.05 % external solution APPLY TO SCALP DAILY.  2   dextromethorphan-guaiFENesin (MUCINEX DM) 30-600 MG 12hr tablet Take 1 tablet by mouth 2 (two) times daily as needed for cough.     KRILL OIL 1000 MG CAPS Take 1,000 mg by mouth every evening.     lisinopril-hydrochlorothiazide (ZESTORETIC) 20-12.5 MG tablet Take 1 tablet by mouth daily. 90 tablet 0   loratadine (CLARITIN) 10 MG tablet Take 10 mg by mouth daily as needed for allergies.     metFORMIN (GLUCOPHAGE) 500 MG tablet Take 1 tablet (500 mg total) by mouth 2 (  two) times daily with a meal. 180 tablet 1   Misc Natural Products (COSAMIN ASU ADVANCED FORMULA) CAPS Take 2 each by mouth 2 (two) times daily.     mometasone (ELOCON) 0.1 % cream APPLY TO EXTERNAL EAR DAILY AS NEEDED FOR ITCHING OF DRY SKIN     Multiple Vitamins-Minerals (MULTIVITAMIN WITH MINERALS) tablet Take 1 tablet by mouth daily.      Multiple Vitamins-Minerals (PRESERVISION AREDS 2+MULTI VIT PO) Take by mouth.     mupirocin ointment (BACTROBAN) 2 % Place 1 application into the nose 2 (two) times daily.     pioglitazone (ACTOS) 30 MG tablet Take 1 tablet (30 mg total) by mouth daily. 90  tablet 0   sertraline (ZOLOFT) 50 MG tablet Take 1.5 tablets (75 mg total) by mouth daily. 135 tablet 0   betamethasone valerate lotion (VALISONE) 0.1 % Apply 1 application topically 2 (two) times daily.     glucose blood (ONE TOUCH ULTRA TEST) test strip USE TO TEST ONCE A DAY AS DIRECTED 100 each 2   Lancets (ONETOUCH ULTRASOFT) lancets USE AS DIRECTED TO TEST ONCE DAILY 200 each 0   sulfamethoxazole-trimethoprim (BACTRIM DS) 800-160 MG tablet Take 1 tablet by mouth 2 (two) times daily. (Patient not taking: Reported on 03/25/2020) 20 tablet 0   traZODone (DESYREL) 100 MG tablet Take 1/2-1 tablet po QHS prn insomnia 90 tablet 1   No current facility-administered medications for this visit.    Medication Side Effects: None  Allergies: No Known Allergies  Past Medical History:  Diagnosis Date   [facility]    Diabetes mellitus    Dyslipidemia    Hyperlipidemia    Hypertension    Obesity    Obesity     Family History  Problem Relation Age of Onset   Asthma Other    Hypertension Other    Diabetes Other     Social History   Socioeconomic History   Marital status: Married    Spouse name: Not on file   Number of children: 2   Years of education: Not on file   Highest education level: Not on file  Occupational History   Occupation: shipping    Employer: [employer]  Tobacco Use   Smoking status: Never Smoker   Smokeless tobacco: Never Used  Substance and Sexual Activity   Alcohol use: No   Drug use: No   Sexual activity: Yes    Birth control/protection: Surgical    Comment: hysterectomy  Other Topics Concern   Not on file  Social History Narrative   Not on file   Social Determinants of Health   Financial Resource Strain:    Difficulty of Paying Living Expenses:   Food Insecurity:    Worried About Charity fundraiser in the Last Year:    Arboriculturist in the Last Year:    Transportation Needs:    Film/video editor (Medical):    Lack of Transportation (Non-Medical):   Physical Activity:    Days of Exercise per Week:    Minutes of Exercise per Session:   Stress:    Feeling of Stress :   Social Connections:    Frequency of Communication with Friends and Family:    Frequency of Social Gatherings with Friends and Family:    Attends Religious Services:    Active Member of Clubs or Organizations:    Attends Archivist Meetings:  Marital Status:   Intimate Partner Violence:    Fear of Current or Ex-Partner:    Emotionally Abused:    Physically Abused:    Sexually Abused:     Past Medical History, Surgical history, Social history, and Family history were reviewed and updated as appropriate.   Please see review of systems for further details on the patient's review from today.   Objective:   Physical Exam:  There were no vitals taken for this visit.  Physical Exam Constitutional:      General: She is not in acute distress. Musculoskeletal:        General: No deformity.  Neurological:     Mental Status: She is alert and oriented to person, place, and time.     Coordination: Coordination normal.  Psychiatric:        Attention and Perception: Attention and perception normal. She does not perceive auditory or visual hallucinations.        Mood and Affect: Mood normal. Mood is not anxious or depressed. Affect is not labile, blunt, angry or inappropriate.        Speech: Speech normal.        Behavior: Behavior normal.        Thought Content: Thought content normal. Thought content is not paranoid or delusional. Thought content does not include homicidal or suicidal ideation. Thought content does not include homicidal or suicidal plan.        Cognition and Memory: Cognition and memory normal.        Judgment: Judgment normal.     Comments: Insight intact     Lab Review:     Component Value Date/Time   NA 142  09/14/2019 1057   K 4.6 09/14/2019 1057   CL 102 09/14/2019 1057   CO2 26 09/14/2019 1057   GLUCOSE 117 (H) 09/14/2019 1057   GLUCOSE 117 (H) 05/01/2017 0813   BUN 17 09/14/2019 1057   CREATININE 0.79 09/14/2019 1057   CREATININE 0.80 05/01/2017 0813   CALCIUM 9.7 09/14/2019 1057   CALCIUM 8.9 10/13/2012 0500   PROT 6.4 09/14/2019 1057   ALBUMIN 4.3 09/14/2019 1057   AST 19 09/14/2019 1057   ALT 18 09/14/2019 1057   ALKPHOS 71 09/14/2019 1057   BILITOT 0.3 09/14/2019 1057   GFRNONAA 77 09/14/2019 1057   GFRAA 88 09/14/2019 1057       Component Value Date/Time   WBC 4.2 09/14/2019 1057   WBC 4.1 05/01/2017 0813   RBC 3.89 09/14/2019 1057   RBC 3.85 05/01/2017 0813   HGB 11.7 09/14/2019 1057   HCT 36.3 09/14/2019 1057   PLT 220 09/14/2019 1057   MCV 93 09/14/2019 1057   MCH 30.1 09/14/2019 1057   MCH 29.6 05/01/2017 0813   MCHC 32.2 09/14/2019 1057   MCHC 31.9 (L) 05/01/2017 0813   RDW 12.7 09/14/2019 1057   LYMPHSABS 0.8 09/14/2019 1057   MONOABS 287 05/01/2017 0813   EOSABS 0.2 09/14/2019 1057   BASOSABS 0.0 09/14/2019 1057    No results found for: POCLITH, LITHIUM   No results found for: PHENYTOIN, PHENOBARB, VALPROATE, CBMZ   .res Assessment: Plan:   Patient seen for 30 minutes and time spent counseling patient regarding possible treatment options for insomnia since she reports that her insomnia is occurring nightly and is not relieved with OTC sleep medicine.  Discussed potential benefits, risks, and side effects of trazodone.  Patient agrees to trial of trazodone.  We will start trazodone 100 mg 1/2-1 tab p.o.  nightly as needed insomnia. Recommend continuing sertraline 75 mg daily for anxiety and depression.  Patient reports that her PCP recently submitted refills for sertraline and she does not need a prescription at this time. Patient to follow-up in 4 months or sooner if clinically indicated. Patient advised to contact office with any questions, adverse  effects, or acute worsening in signs and symptoms.  [name] was seen today for follow-up.  Diagnoses and all orders for this visit:  Insomnia, unspecified type -     traZODone (DESYREL) 100 MG tablet; Take 1/2-1 tablet po QHS prn insomnia     Please see After Visit Summary for patient specific instructions.  Future Appointments  Date Time Provider [place]  03/28/2020  8:15 AM Denita Lung, MD PFM-PFM [facility]  03/29/2020 11:00 AM Minus Breeding, MD CVD-NORTHLIN [facility]  07/25/2020  9:30 AM Thayer Headings, PMHNP CP-CP None  12/16/2020  9:15 AM Hayden Pedro, MD TRE-TRE None  03/16/2021  8:15 AM Denita Lung, MD PFM-PFM PFSM    No orders of the defined types were placed in this encounter.   -------------------------------

## 2020-03-28 ENCOUNTER — Encounter: Payer: Self-pay | Admitting: Family Medicine

## 2020-03-28 ENCOUNTER — Other Ambulatory Visit: Payer: Self-pay

## 2020-03-28 ENCOUNTER — Encounter: Payer: Self-pay | Admitting: Cardiology

## 2020-03-28 ENCOUNTER — Ambulatory Visit (INDEPENDENT_AMBULATORY_CARE_PROVIDER_SITE_OTHER): Payer: Medicare Other | Admitting: Family Medicine

## 2020-03-28 VITALS — BP 110/70 | HR 64 | Temp 97.8°F | Wt 212.8 lb

## 2020-03-28 DIAGNOSIS — Z7189 Other specified counseling: Secondary | ICD-10-CM | POA: Insufficient documentation

## 2020-03-28 DIAGNOSIS — R319 Hematuria, unspecified: Secondary | ICD-10-CM

## 2020-03-28 DIAGNOSIS — R9431 Abnormal electrocardiogram [ECG] [EKG]: Secondary | ICD-10-CM | POA: Insufficient documentation

## 2020-03-28 LAB — POCT URINALYSIS DIP (PROADVANTAGE DEVICE)
Bilirubin, UA: NEGATIVE
Blood, UA: NEGATIVE
Glucose, UA: NEGATIVE mg/dL
Ketones, POC UA: NEGATIVE mg/dL
Leukocytes, UA: NEGATIVE
Nitrite, UA: NEGATIVE
Protein Ur, POC: NEGATIVE mg/dL
Specific Gravity, Urine: 1.015
Urobilinogen, Ur: 0.2
pH, UA: 5.5 (ref 5.0–8.0)

## 2020-03-28 NOTE — Progress Notes (Signed)
Cardiology Office Note   Date:  03/29/2020   ID:  [name], DOB 09/11/1951, MRN [id number]  PCP:  Denita Lung, MD  Cardiologist:   No primary care provider on file. Referring:  Denita Lung, MD  Chief Complaint  Patient presents with  . Palpitations      History of Present Illness: [name] is a 69 y.o. female who presents for evaluation of an abnormal EKG. the patient was noted to have premature ventricular contractions on EKG.  She says she has been noticing this for some time.  It might be with a little increased frequency or intensity.  However, its not particularly bothersome to her.  She notices it more when she is quiet but sometimes during the day.  She will feel a skipped beat.  She is not describing sustained rapid beats.  She cannot bring these on.  She does not describe presyncope or syncope.  She does not have chest pressure, neck or arm discomfort.  She has had no weight gain or edema.  She has had a slow decrease in her energy level.  She is not been sleeping well and just started some management for this.   Past Medical History:  Diagnosis Date  . Basal cell carcinoma [facility]   . Diabetes mellitus   . Hyperlipidemia   . Hypertension   . Obesity     Past Surgical History:  Procedure Laterality Date  . ABDOMINAL HYSTERECTOMY    . fractured patella repair  02/2011   Dr. Theda Sers; R knee  . ORIF TIBIA FRACTURE    . ORIF TIBIA PLATEAU  10/12/2012   Procedure: OPEN REDUCTION INTERNAL FIXATION (ORIF) TIBIAL PLATEAU;  Surgeon: Rozanna Box, MD;  Location: [place];  Service: Orthopedics;  Laterality: Right;  . ORIF WRIST FRACTURE  11/03/2012   Procedure: OPEN REDUCTION INTERNAL FIXATION (ORIF) WRIST FRACTURE;  Surgeon: Rozanna Box, MD;  Location: [place];  Service: Orthopedics;  Laterality: Left;  . TOTAL ABDOMINAL HYSTERECTOMY W/ BILATERAL SALPINGOOPHORECTOMY     fibroids     Current Outpatient Medications    Medication Sig Dispense Refill  . acetaminophen (TYLENOL) 325 MG tablet Take 325-650 mg by mouth every 6 (six) hours as needed. Reported on 04/26/2016    . aspirin EC 81 MG tablet Take 81 mg by mouth daily.    Marland Kitchen atorvastatin (LIPITOR) 10 MG tablet Take 1 tablet (10 mg total) by mouth daily. 90 tablet 3  . betamethasone valerate lotion (VALISONE) 0.1 % Apply 1 application topically 2 (two) times daily.    . calcium carbonate (OS-CAL) 600 MG TABS Take 600 mg by mouth 2 (two) times daily with a meal.      . Cholecalciferol (VITAMIN D) 2000 UNITS tablet Take 2,000 Units by mouth daily.    . clobetasol (TEMOVATE) 0.05 % external solution APPLY TO SCALP DAILY.  2  . dextromethorphan-guaiFENesin (MUCINEX DM) 30-600 MG 12hr tablet Take 1 tablet by mouth 2 (two) times daily as needed for cough.    Marland Kitchen glucose blood (ONE TOUCH ULTRA TEST) test strip USE TO TEST ONCE A DAY AS DIRECTED 100 each 2  . KRILL OIL 1000 MG CAPS Take 1,000 mg by mouth every evening.    . Lancets (ONETOUCH ULTRASOFT) lancets USE AS DIRECTED TO TEST ONCE DAILY 200 each 0  . lisinopril-hydrochlorothiazide (ZESTORETIC) 20-12.5 MG tablet Take 1 tablet by mouth daily. 90 tablet 0  .  loratadine (CLARITIN) 10 MG tablet Take 10 mg by mouth daily as needed for allergies.    . metFORMIN (GLUCOPHAGE) 500 MG tablet Take 1 tablet (500 mg total) by mouth 2 (two) times daily with a meal. 180 tablet 1  . Misc Natural Products (COSAMIN ASU ADVANCED FORMULA) CAPS Take 2 each by mouth 2 (two) times daily.    . mometasone (ELOCON) 0.1 % cream APPLY TO EXTERNAL EAR DAILY AS NEEDED FOR ITCHING OF DRY SKIN    . Multiple Vitamins-Minerals (MULTIVITAMIN WITH MINERALS) tablet Take 1 tablet by mouth daily.     . Multiple Vitamins-Minerals (PRESERVISION AREDS 2+MULTI VIT PO) Take by mouth.    . mupirocin ointment (BACTROBAN) 2 % Place 1 application into the nose 2 (two) times daily.    . pioglitazone (ACTOS) 30 MG tablet Take 1 tablet (30 mg total) by mouth  daily. 90 tablet 0  . sertraline (ZOLOFT) 50 MG tablet Take 1.5 tablets (75 mg total) by mouth daily. 135 tablet 0  . traZODone (DESYREL) 100 MG tablet Take 1/2-1 tablet po QHS prn insomnia 90 tablet 1   No current facility-administered medications for this visit.    Allergies:   Patient has no known allergies.    Social History:  The patient  reports that she has never smoked. She has never used smokeless tobacco. She reports that she does not drink alcohol or use drugs.   Family History:  The patient's family history includes Asthma in an other family member; Diabetes in her father and another family member; Hypertension in an other family member; Stroke in her father.    ROS:  Please see the history of present illness.   Otherwise, review of systems are positive for none.   All other systems are reviewed and negative.    PHYSICAL EXAM: VS:  BP 114/60   Pulse 78   Ht 5\' 3"  (1.6 m)   Wt 212 lb (96.2 kg)   SpO2 96%   BMI 37.55 kg/m  , BMI Body mass index is 37.55 kg/m. GENERAL:  Well appearing HEENT:  Pupils equal round and reactive, fundi not visualized, oral mucosa unremarkable NECK:  No jugular venous distention, waveform within normal limits, carotid upstroke brisk and symmetric, no bruits, no thyromegaly LYMPHATICS:  No cervical, inguinal adenopathy LUNGS:  Clear to auscultation bilaterally BACK:  No CVA tenderness CHEST:  Unremarkable HEART:  PMI not displaced or sustained,S1 and S2 within normal limits, no S3, no S4, no clicks, no rubs, very soft 2 out of 6 brief systolic murmur after the beat following the PVC.,  No diastolic murmurs ABD:  Flat, positive bowel sounds normal in frequency in pitch, no bruits, no rebound, no guarding, no midline pulsatile mass, no hepatomegaly, no splenomegaly EXT:  2 plus pulses throughout, no edema, no cyanosis no clubbing SKIN:  No rashes no nodules NEURO:  Cranial nerves II through XII grossly intact, motor grossly intact  throughout PSYCH:  Cognitively intact, oriented to person place and time    EKG:  EKG is ordered today. The ekg ordered today demonstrates sinus rhythm, rate 74, axis within normal limits, intervals within normal limits, premature ventricular contractions.   Recent Labs: 09/14/2019: ALT 18; BUN 17; Creatinine, Ser 0.79; Hemoglobin 11.7; Platelets 220; Potassium 4.6; Sodium 142    Lipid Panel    Component Value Date/Time   CHOL 130 09/14/2019 1057   TRIG 85 09/14/2019 1057   HDL 54 09/14/2019 1057   CHOLHDL 2.4 09/14/2019 1057  CHOLHDL 2.6 05/01/2017 0813   VLDL 18 05/01/2017 0813   LDLCALC 60 09/14/2019 1057      Wt Readings from Last 3 Encounters:  03/29/20 212 lb (96.2 kg)  03/28/20 212 lb 12.8 oz (96.5 kg)  03/14/20 212 lb 3.2 oz (96.3 kg)      Other studies Reviewed: Additional studies/ records that were reviewed today include: Labs. Review of the above records demonstrates:  Please see elsewhere in the note.     ASSESSMENT AND PLAN:  ABNORMAL EKG: The patient has premature ventricular contractions.  She does have a very slight post PVC murmur so well do an echocardiogram to make sure there is no septal hypertrophy.  However, I do not strongly suspect structural abnormality and she is not overly symptomatic from this.  I will be checking electrolytes and TSH as well.  If these are okay she would prefer conservative management.  HTN: Blood pressures well controlled.  No change in therapy.  DYSLIPIDEMIA: LDL was 60 with an HDL of 54 in November.  No change in therapy.     Current medicines are reviewed at length with the patient today.  The patient does not have concerns regarding medicines.  The following changes have been made:  no change  Labs/ tests ordered today include:   Orders Placed This Encounter  Procedures  . Basic metabolic panel  . Magnesium  . TSH  . EKG 12-Lead  . ECHOCARDIOGRAM COMPLETE     Disposition:   FU with me as needed      Signed, Minus Breeding, MD  03/29/2020 11:53 AM    [place]

## 2020-03-28 NOTE — Progress Notes (Signed)
   Subjective:    Patient ID: [name], female    DOB: March 24, 1951, 69 y.o.   MRN: [id number]  HPI She is here for recheck.  She did have hematuria and evidence of UTI.  She was placed on Septra and did well on that.  She has had no frequency, urgency.   Review of Systems     Objective:   Physical Exam Alert and in no distress.  Dipstick was negative.       Assessment & Plan:  Hematuria, unspecified type She probably had a UTI and since no blood in urine, no further intervention needed.

## 2020-03-29 ENCOUNTER — Ambulatory Visit (INDEPENDENT_AMBULATORY_CARE_PROVIDER_SITE_OTHER): Payer: Medicare Other | Admitting: Cardiology

## 2020-03-29 ENCOUNTER — Encounter: Payer: Self-pay | Admitting: Cardiology

## 2020-03-29 VITALS — BP 114/60 | HR 78 | Ht 63.0 in | Wt 212.0 lb

## 2020-03-29 DIAGNOSIS — I10 Essential (primary) hypertension: Secondary | ICD-10-CM

## 2020-03-29 DIAGNOSIS — E785 Hyperlipidemia, unspecified: Secondary | ICD-10-CM

## 2020-03-29 DIAGNOSIS — R9431 Abnormal electrocardiogram [ECG] [EKG]: Secondary | ICD-10-CM | POA: Diagnosis not present

## 2020-03-29 DIAGNOSIS — I493 Ventricular premature depolarization: Secondary | ICD-10-CM | POA: Diagnosis not present

## 2020-03-29 NOTE — Patient Instructions (Signed)
Medication Instructions:  NO CHANGES *If you need a refill on your cardiac medications before your next appointment, please call your pharmacy*  Lab Work: Your physician recommends that you return for lab work TODAY (BMP, MAG. TSH) If you have labs (blood work) drawn today and your tests are completely normal, you will receive your results only by: Marland Kitchen MyChart Message (if you have MyChart) OR . A paper copy in the mail If you have any lab test that is abnormal or we need to change your treatment, we will call you to review the results.  Testing/Procedures: Your physician has requested that you have an echocardiogram. Echocardiography is a painless test that uses sound waves to create images of your heart. It provides your doctor with information about the size and shape of your heart and how well your heart's chambers and valves are working. This procedure takes approximately one hour. There are no restrictions for this procedure. [place]  Follow-Up: At [facility], you and your health needs are our priority.  As part of our continuing mission to provide you with exceptional heart care, we have created designated Provider Care Teams.  These Care Teams include your primary Cardiologist (physician) and Advanced Practice Providers (APPs -  Physician Assistants and Nurse Practitioners) who all work together to provide you with the care you need, when you need it.    Your next appointment:   FOLLOW UP AS NEEDED

## 2020-03-30 LAB — BASIC METABOLIC PANEL
BUN/Creatinine Ratio: 36 — ABNORMAL HIGH (ref 12–28)
BUN: 26 mg/dL (ref 8–27)
CO2: 26 mmol/L (ref 20–29)
Calcium: 10.1 mg/dL (ref 8.7–10.3)
Chloride: 103 mmol/L (ref 96–106)
Creatinine, Ser: 0.72 mg/dL (ref 0.57–1.00)
GFR calc Af Amer: 98 mL/min/{1.73_m2} (ref >59)
GFR calc non Af Amer: 85 mL/min/{1.73_m2} (ref >59)
Glucose: 127 mg/dL — ABNORMAL HIGH (ref 65–99)
Potassium: 5.2 mmol/L (ref 3.5–5.2)
Sodium: 144 mmol/L (ref 134–144)

## 2020-03-30 LAB — MAGNESIUM: Magnesium: 1.9 mg/dL (ref 1.6–2.3)

## 2020-03-30 LAB — TSH: TSH: 0.795 u[IU]/mL (ref 0.450–4.500)

## 2020-04-20 ENCOUNTER — Other Ambulatory Visit: Payer: Self-pay

## 2020-04-20 ENCOUNTER — Ambulatory Visit (HOSPITAL_COMMUNITY): Payer: Medicare Other | Attending: Cardiovascular Disease

## 2020-04-20 DIAGNOSIS — R9431 Abnormal electrocardiogram [ECG] [EKG]: Secondary | ICD-10-CM | POA: Diagnosis not present

## 2020-04-28 ENCOUNTER — Other Ambulatory Visit: Payer: Self-pay | Admitting: Family Medicine

## 2020-05-12 ENCOUNTER — Encounter: Payer: Self-pay | Admitting: Family Medicine

## 2020-05-12 MED ORDER — MUPIROCIN 2 % EX OINT: 1.0000 "application " | TOPICAL_OINTMENT | Freq: Two times a day (BID) | CUTANEOUS | 1 refills | Status: DC

## 2020-05-19 ENCOUNTER — Encounter: Payer: Self-pay | Admitting: Family Medicine

## 2020-05-20 DIAGNOSIS — S62633A Displaced fracture of distal phalanx of left middle finger, initial encounter for closed fracture: Secondary | ICD-10-CM | POA: Diagnosis not present

## 2020-05-20 DIAGNOSIS — S62603A Fracture of unspecified phalanx of left middle finger, initial encounter for closed fracture: Secondary | ICD-10-CM | POA: Diagnosis not present

## 2020-05-20 DIAGNOSIS — W19XXXA Unspecified fall, initial encounter: Secondary | ICD-10-CM | POA: Diagnosis not present

## 2020-05-20 DIAGNOSIS — S80212A Abrasion, left knee, initial encounter: Secondary | ICD-10-CM | POA: Diagnosis not present

## 2020-05-24 DIAGNOSIS — S62625A Displaced fracture of medial phalanx of left ring finger, initial encounter for closed fracture: Secondary | ICD-10-CM | POA: Diagnosis not present

## 2020-05-26 ENCOUNTER — Telehealth: Payer: Self-pay

## 2020-05-26 NOTE — Telephone Encounter (Signed)
Have her come in and see me to determine what if anything needs to be done.

## 2020-05-26 NOTE — Telephone Encounter (Signed)
I called pt. LM for call back to get scheduled for an appointment.

## 2020-05-26 NOTE — Telephone Encounter (Signed)
Pt. Called stating that she fell and broke her middle finger on her lt. Hand and she didn't know if you wanted to look at it or does she need to see ortho. She is out of town in [place]. And will be back Monday or Tuesday next week.

## 2020-06-01 ENCOUNTER — Ambulatory Visit (INDEPENDENT_AMBULATORY_CARE_PROVIDER_SITE_OTHER): Payer: Medicare Other | Admitting: Family Medicine

## 2020-06-01 ENCOUNTER — Other Ambulatory Visit: Payer: Self-pay

## 2020-06-01 ENCOUNTER — Encounter: Payer: Self-pay | Admitting: Family Medicine

## 2020-06-01 VITALS — BP 138/72 | HR 62 | Temp 98.2°F | Wt 216.8 lb

## 2020-06-01 DIAGNOSIS — S62653A Nondisplaced fracture of medial phalanx of left middle finger, initial encounter for closed fracture: Secondary | ICD-10-CM | POA: Diagnosis not present

## 2020-06-01 NOTE — Progress Notes (Signed)
   Subjective:    Patient ID: [name], female    DOB: 02-12-1951, 69 y.o.   MRN: [id number]  HPI She apparently fell and fractured her left middle finger while on a trip to [place].  She was seen in an urgent care center, x-rays were taken and splinted.  She will need follow-up.  Apparently there is insurance involved in this.   Review of Systems     Objective:   Physical Exam Exam of the left hand does show some distal swelling to the third finger near the DIP joint.  Slight tenderness palpation proximal to the DIP joint is noted.  Minimal swelling.       Assessment & Plan:  Closed nondisplaced fracture of middle phalanx of left middle finger, initial encounter - Plan: Ambulatory referral to Orthopedic Surgery It looks like this is healing nicely but since there is insurance involved, I will refer to orthopedics. The finger was buddy taped and the splint was reapplied.  She will be referred to an orthopedic surgeon she has had previous experience with it.

## 2020-06-08 DIAGNOSIS — S62623A Displaced fracture of medial phalanx of left middle finger, initial encounter for closed fracture: Secondary | ICD-10-CM | POA: Diagnosis not present

## 2020-06-14 DIAGNOSIS — M25642 Stiffness of left hand, not elsewhere classified: Secondary | ICD-10-CM | POA: Diagnosis not present

## 2020-06-14 DIAGNOSIS — M25542 Pain in joints of left hand: Secondary | ICD-10-CM | POA: Diagnosis not present

## 2020-06-14 DIAGNOSIS — S62603D Fracture of unspecified phalanx of left middle finger, subsequent encounter for fracture with routine healing: Secondary | ICD-10-CM | POA: Diagnosis not present

## 2020-06-14 DIAGNOSIS — R531 Weakness: Secondary | ICD-10-CM | POA: Diagnosis not present

## 2020-06-16 DIAGNOSIS — R531 Weakness: Secondary | ICD-10-CM | POA: Diagnosis not present

## 2020-06-16 DIAGNOSIS — S62603D Fracture of unspecified phalanx of left middle finger, subsequent encounter for fracture with routine healing: Secondary | ICD-10-CM | POA: Diagnosis not present

## 2020-06-16 DIAGNOSIS — M25642 Stiffness of left hand, not elsewhere classified: Secondary | ICD-10-CM | POA: Diagnosis not present

## 2020-06-16 DIAGNOSIS — M25542 Pain in joints of left hand: Secondary | ICD-10-CM | POA: Diagnosis not present

## 2020-06-20 DIAGNOSIS — S62603D Fracture of unspecified phalanx of left middle finger, subsequent encounter for fracture with routine healing: Secondary | ICD-10-CM | POA: Diagnosis not present

## 2020-06-20 DIAGNOSIS — M25542 Pain in joints of left hand: Secondary | ICD-10-CM | POA: Diagnosis not present

## 2020-06-20 DIAGNOSIS — M25642 Stiffness of left hand, not elsewhere classified: Secondary | ICD-10-CM | POA: Diagnosis not present

## 2020-06-20 DIAGNOSIS — R531 Weakness: Secondary | ICD-10-CM | POA: Diagnosis not present

## 2020-06-22 DIAGNOSIS — S62603D Fracture of unspecified phalanx of left middle finger, subsequent encounter for fracture with routine healing: Secondary | ICD-10-CM | POA: Diagnosis not present

## 2020-06-22 DIAGNOSIS — M25642 Stiffness of left hand, not elsewhere classified: Secondary | ICD-10-CM | POA: Diagnosis not present

## 2020-06-22 DIAGNOSIS — S62623D Displaced fracture of medial phalanx of left middle finger, subsequent encounter for fracture with routine healing: Secondary | ICD-10-CM | POA: Diagnosis not present

## 2020-06-22 DIAGNOSIS — R531 Weakness: Secondary | ICD-10-CM | POA: Diagnosis not present

## 2020-06-22 DIAGNOSIS — M25542 Pain in joints of left hand: Secondary | ICD-10-CM | POA: Diagnosis not present

## 2020-06-28 DIAGNOSIS — S62603D Fracture of unspecified phalanx of left middle finger, subsequent encounter for fracture with routine healing: Secondary | ICD-10-CM | POA: Diagnosis not present

## 2020-06-28 DIAGNOSIS — M25642 Stiffness of left hand, not elsewhere classified: Secondary | ICD-10-CM | POA: Diagnosis not present

## 2020-06-28 DIAGNOSIS — M25542 Pain in joints of left hand: Secondary | ICD-10-CM | POA: Diagnosis not present

## 2020-06-28 DIAGNOSIS — R531 Weakness: Secondary | ICD-10-CM | POA: Diagnosis not present

## 2020-06-30 DIAGNOSIS — M25542 Pain in joints of left hand: Secondary | ICD-10-CM | POA: Diagnosis not present

## 2020-06-30 DIAGNOSIS — R531 Weakness: Secondary | ICD-10-CM | POA: Diagnosis not present

## 2020-06-30 DIAGNOSIS — M25642 Stiffness of left hand, not elsewhere classified: Secondary | ICD-10-CM | POA: Diagnosis not present

## 2020-06-30 DIAGNOSIS — S62603D Fracture of unspecified phalanx of left middle finger, subsequent encounter for fracture with routine healing: Secondary | ICD-10-CM | POA: Diagnosis not present

## 2020-07-05 DIAGNOSIS — M25642 Stiffness of left hand, not elsewhere classified: Secondary | ICD-10-CM | POA: Diagnosis not present

## 2020-07-05 DIAGNOSIS — R531 Weakness: Secondary | ICD-10-CM | POA: Diagnosis not present

## 2020-07-05 DIAGNOSIS — S62603D Fracture of unspecified phalanx of left middle finger, subsequent encounter for fracture with routine healing: Secondary | ICD-10-CM | POA: Diagnosis not present

## 2020-07-05 DIAGNOSIS — M25542 Pain in joints of left hand: Secondary | ICD-10-CM | POA: Diagnosis not present

## 2020-07-07 DIAGNOSIS — S62603D Fracture of unspecified phalanx of left middle finger, subsequent encounter for fracture with routine healing: Secondary | ICD-10-CM | POA: Diagnosis not present

## 2020-07-07 DIAGNOSIS — M25542 Pain in joints of left hand: Secondary | ICD-10-CM | POA: Diagnosis not present

## 2020-07-07 DIAGNOSIS — M25642 Stiffness of left hand, not elsewhere classified: Secondary | ICD-10-CM | POA: Diagnosis not present

## 2020-07-07 DIAGNOSIS — R531 Weakness: Secondary | ICD-10-CM | POA: Diagnosis not present

## 2020-07-12 ENCOUNTER — Other Ambulatory Visit: Payer: Medicare Other

## 2020-07-12 ENCOUNTER — Other Ambulatory Visit: Payer: Self-pay | Admitting: Internal Medicine

## 2020-07-12 DIAGNOSIS — Z20822 Contact with and (suspected) exposure to covid-19: Secondary | ICD-10-CM

## 2020-07-14 LAB — NOVEL CORONAVIRUS, NAA: SARS-CoV-2, NAA: NOT DETECTED

## 2020-07-14 LAB — SARS-COV-2, NAA 2 DAY TAT

## 2020-07-19 DIAGNOSIS — M25642 Stiffness of left hand, not elsewhere classified: Secondary | ICD-10-CM | POA: Diagnosis not present

## 2020-07-19 DIAGNOSIS — S62603D Fracture of unspecified phalanx of left middle finger, subsequent encounter for fracture with routine healing: Secondary | ICD-10-CM | POA: Diagnosis not present

## 2020-07-19 DIAGNOSIS — M25542 Pain in joints of left hand: Secondary | ICD-10-CM | POA: Diagnosis not present

## 2020-07-19 DIAGNOSIS — R531 Weakness: Secondary | ICD-10-CM | POA: Diagnosis not present

## 2020-07-21 DIAGNOSIS — S62603D Fracture of unspecified phalanx of left middle finger, subsequent encounter for fracture with routine healing: Secondary | ICD-10-CM | POA: Diagnosis not present

## 2020-07-21 DIAGNOSIS — M25542 Pain in joints of left hand: Secondary | ICD-10-CM | POA: Diagnosis not present

## 2020-07-21 DIAGNOSIS — R531 Weakness: Secondary | ICD-10-CM | POA: Diagnosis not present

## 2020-07-21 DIAGNOSIS — M25642 Stiffness of left hand, not elsewhere classified: Secondary | ICD-10-CM | POA: Diagnosis not present

## 2020-07-24 ENCOUNTER — Other Ambulatory Visit: Payer: Self-pay | Admitting: Family Medicine

## 2020-07-25 ENCOUNTER — Other Ambulatory Visit: Payer: Self-pay

## 2020-07-25 ENCOUNTER — Ambulatory Visit (INDEPENDENT_AMBULATORY_CARE_PROVIDER_SITE_OTHER): Payer: Medicare Other | Admitting: Psychiatry

## 2020-07-25 ENCOUNTER — Encounter: Payer: Self-pay | Admitting: Psychiatry

## 2020-07-25 DIAGNOSIS — G4700 Insomnia, unspecified: Secondary | ICD-10-CM

## 2020-07-25 DIAGNOSIS — F4323 Adjustment disorder with mixed anxiety and depressed mood: Secondary | ICD-10-CM

## 2020-07-25 MED ORDER — SERTRALINE HCL 50 MG PO TABS: 75.0000 mg | ORAL_TABLET | Freq: Every day | ORAL | 1 refills | Status: DC

## 2020-07-25 NOTE — Progress Notes (Signed)
[name] [id number] 01/08/1951 69 y.o.  Subjective:   Patient ID:  [name] is a 69 y.o. (DOB 1951/01/13) female.  Chief Complaint:  Chief Complaint  Patient presents with  . Follow-up    h/o adjustment disorder    HPI [name] presents to the office today for follow-up of mood and anxiety. She reports that her mood has been "pretty good." Husband recently had COVID and he has been staying in their master bedroom and she was staying in their spare bedroom. Husband dx'd on 07/07/20 and they were in the ER overnight. She has disrupted sleep around this time. She did not get COVID. Husband has recovered and is now doing well. She has some mild situational anxiety when husband was in ER.   She reports adequate sleep. She reports that her concentration has been ok. She reports that she will walk away when she has difficulty focusing. She reports that she caught an error in her calculations and did not get overwhelmed or have uncontrolled crying. She reports her energy and motivation have been good. Appetite has been good. Denies SI.   She reports that she does not take Trazodone every night and has Trazodone script remaining.  Past Psychiatric Medication Trials: Sertraline Melatonin  PHQ2-9     Office Visit from 03/14/2020 in [place] Visit from 12/30/2018 in [place] Visit from 05/12/2018 in [place] Visit from 12/26/2016 in [place] Visit from 09/13/2015 in [place]  PHQ-2 Total Score 0 0 0 0 0       Review of Systems:  Review of Systems  Musculoskeletal: Negative for gait problem.       Fractured her finger while visiting family in [place] and going to sister's house in the dark. Has been going to PT to increase ROM.   Neurological: Negative for tremors.  Psychiatric/Behavioral:       Please refer to HPI    Medications: I have reviewed the patient's current  medications.  Current Outpatient Medications  Medication Sig Dispense Refill  . acetaminophen (TYLENOL) 325 MG tablet Take 325-650 mg by mouth every 6 (six) hours as needed. Reported on 04/26/2016    . aspirin EC 81 MG tablet Take 81 mg by mouth daily.    Marland Kitchen atorvastatin (LIPITOR) 10 MG tablet Take 1 tablet (10 mg total) by mouth daily. 90 tablet 3  . calcium carbonate (OS-CAL) 600 MG TABS Take 600 mg by mouth 2 (two) times daily with a meal.      . Cholecalciferol (VITAMIN D) 2000 UNITS tablet Take 2,000 Units by mouth daily.    Marland Kitchen dextromethorphan-guaiFENesin (MUCINEX DM) 30-600 MG 12hr tablet Take 1 tablet by mouth 2 (two) times daily as needed for cough.    Marland Kitchen KRILL OIL 1000 MG CAPS Take 1,000 mg by mouth every evening.    Marland Kitchen lisinopril-hydrochlorothiazide (ZESTORETIC) 20-12.5 MG tablet Take 1 tablet by mouth daily. 90 tablet 0  . loratadine (CLARITIN) 10 MG tablet Take 10 mg by mouth daily as needed for allergies.    . metFORMIN (GLUCOPHAGE) 500 MG tablet Take 1 tablet (500 mg total) by mouth 2 (two) times daily with a meal. 180 tablet 1  . Misc Natural Products (COSAMIN ASU ADVANCED FORMULA) CAPS Take 2 each by mouth 2 (two) times daily.    . Multiple Vitamins-Minerals (MULTIVITAMIN WITH MINERALS) tablet Take 1 tablet by mouth daily.     . Multiple Vitamins-Minerals (PRESERVISION  AREDS 2+MULTI VIT PO) Take by mouth.    . pioglitazone (ACTOS) 30 MG tablet Take 1 tablet (30 mg total) by mouth daily. 90 tablet 0  . traZODone (DESYREL) 100 MG tablet Take 1/2-1 tablet po QHS prn insomnia 90 tablet 1  . betamethasone valerate lotion (VALISONE) 0.1 % Apply 1 application topically 2 (two) times daily.    . clobetasol (TEMOVATE) 0.05 % external solution APPLY TO SCALP DAILY.  2  . Lancets (ONETOUCH ULTRASOFT) lancets USE AS DIRECTED TO TEST ONCE DAILY 200 each 0  . mometasone (ELOCON) 0.1 % cream APPLY TO EXTERNAL EAR DAILY AS NEEDED FOR ITCHING OF DRY SKIN    . mupirocin ointment (BACTROBAN) 2 % Place  1 application into the nose 2 (two) times daily. 22 g 1  . ONETOUCH ULTRA test strip TEST ONCE DAILY AS DIRECTED 100 strip 3  . sertraline (ZOLOFT) 50 MG tablet Take 1.5 tablets (75 mg total) by mouth daily. 135 tablet 0   No current facility-administered medications for this visit.    Medication Side Effects: None  Allergies: No Known Allergies  Past Medical History:  Diagnosis Date  . Basal cell carcinoma of nose 11/30/13   [facility]   . Diabetes mellitus   . Hyperlipidemia   . Hypertension   . Obesity     Family History  Problem Relation Age of Onset  . Asthma Other   . Hypertension Other   . Diabetes Other   . Stroke Father   . Diabetes Father     Social History   Socioeconomic History  . Marital status: Married    Spouse name: Not on file  . Number of children: 2  . Years of education: Not on file  . Highest education level: Not on file  Occupational History  . Occupation: Personal assistant: [employer]  Tobacco Use  . Smoking status: Never Smoker  . Smokeless tobacco: Never Used  Substance and Sexual Activity  . Alcohol use: No  . Drug use: No  . Sexual activity: Yes    Birth control/protection: Surgical    Comment: hysterectomy  Other Topics Concern  . Not on file  Social History Narrative   Lives with Pilar Plate.  Two children and two grands.     Social Determinants of Health   Financial Resource Strain:   . Difficulty of Paying Living Expenses: Not on file  Food Insecurity:   . Worried About Charity fundraiser in the Last Year: Not on file  . Ran Out of Food in the Last Year: Not on file  Transportation Needs:   . Lack of Transportation (Medical): Not on file  . Lack of Transportation (Non-Medical): Not on file  Physical Activity:   . Days of Exercise per Week: Not on file  . Minutes of Exercise per Session: Not on file  Stress:   . Feeling of Stress : Not on file  Social Connections:   . Frequency of Communication with  Friends and Family: Not on file  . Frequency of Social Gatherings with Friends and Family: Not on file  . Attends Religious Services: Not on file  . Active Member of Clubs or Organizations: Not on file  . Attends Archivist Meetings: Not on file  . Marital Status: Not on file  Intimate Partner Violence:   . Fear of Current or Ex-Partner: Not on file  . Emotionally Abused: Not on file  . Physically Abused: Not on file  .  Sexually Abused: Not on file    Past Medical History, Surgical history, Social history, and Family history were reviewed and updated as appropriate.   Please see review of systems for further details on the patient's review from today.   Objective:   Physical Exam:  There were no vitals taken for this visit.  Physical Exam Constitutional:      General: She is not in acute distress. Musculoskeletal:        General: No deformity.  Neurological:     Mental Status: She is alert and oriented to person, place, and time.     Coordination: Coordination normal.  Psychiatric:        Attention and Perception: Attention and perception normal. She does not perceive auditory or visual hallucinations.        Mood and Affect: Mood normal. Mood is not anxious or depressed. Affect is not labile, blunt, angry or inappropriate.        Speech: Speech normal.        Behavior: Behavior normal.        Thought Content: Thought content normal. Thought content is not paranoid or delusional. Thought content does not include homicidal or suicidal ideation. Thought content does not include homicidal or suicidal plan.        Cognition and Memory: Cognition and memory normal.        Judgment: Judgment normal.     Comments: Insight intact     Lab Review:     Component Value Date/Time   NA 144 03/29/2020 1156   K 5.2 03/29/2020 1156   CL 103 03/29/2020 1156   CO2 26 03/29/2020 1156   GLUCOSE 127 (H) 03/29/2020 1156   GLUCOSE 117 (H) 05/01/2017 0813   BUN 26 03/29/2020 1156    CREATININE 0.72 03/29/2020 1156   CREATININE 0.80 05/01/2017 0813   CALCIUM 10.1 03/29/2020 1156   CALCIUM 8.9 10/13/2012 0500   PROT 6.4 09/14/2019 1057   ALBUMIN 4.3 09/14/2019 1057   AST 19 09/14/2019 1057   ALT 18 09/14/2019 1057   ALKPHOS 71 09/14/2019 1057   BILITOT 0.3 09/14/2019 1057   GFRNONAA 85 03/29/2020 1156   GFRAA 98 03/29/2020 1156       Component Value Date/Time   WBC 4.2 09/14/2019 1057   WBC 4.1 05/01/2017 0813   RBC 3.89 09/14/2019 1057   RBC 3.85 05/01/2017 0813   HGB 11.7 09/14/2019 1057   HCT 36.3 09/14/2019 1057   PLT 220 09/14/2019 1057   MCV 93 09/14/2019 1057   MCH 30.1 09/14/2019 1057   MCH 29.6 05/01/2017 0813   MCHC 32.2 09/14/2019 1057   MCHC 31.9 (L) 05/01/2017 0813   RDW 12.7 09/14/2019 1057   LYMPHSABS 0.8 09/14/2019 1057   MONOABS 287 05/01/2017 0813   EOSABS 0.2 09/14/2019 1057   BASOSABS 0.0 09/14/2019 1057    No results found for: POCLITH, LITHIUM   No results found for: PHENYTOIN, PHENOBARB, VALPROATE, CBMZ   .res Assessment: Plan:   Continue Sertaline 75 mg po qd for depression and anxiety.  Continue Trazodone 100 mg 1/2-1 tab po QHS prn insomnia. Pt does not need a script at this time.  Pt to follow-up in 6 months or sooner if clinically indicated.  Patient advised to contact office with any questions, adverse effects, or acute worsening in signs and symptoms.  [name] was seen today for follow-up.  Diagnoses and all orders for this visit:  Adjustment disorder with mixed anxiety and depressed mood  Insomnia,  unspecified type     Please see After Visit Summary for patient specific instructions.  Future Appointments  Date Time Provider [place]  08/30/2020 10:30 AM Denita Lung, MD [facility]  12/21/2020  9:15 AM Hayden Pedro, MD [facility] None  03/16/2021  8:15 AM Denita Lung, MD [facility]    No orders of the defined types were placed in this encounter.   -------------------------------

## 2020-07-26 DIAGNOSIS — R531 Weakness: Secondary | ICD-10-CM | POA: Diagnosis not present

## 2020-07-26 DIAGNOSIS — M25642 Stiffness of left hand, not elsewhere classified: Secondary | ICD-10-CM | POA: Diagnosis not present

## 2020-07-26 DIAGNOSIS — M25542 Pain in joints of left hand: Secondary | ICD-10-CM | POA: Diagnosis not present

## 2020-07-26 DIAGNOSIS — S62603D Fracture of unspecified phalanx of left middle finger, subsequent encounter for fracture with routine healing: Secondary | ICD-10-CM | POA: Diagnosis not present

## 2020-07-28 DIAGNOSIS — S62603D Fracture of unspecified phalanx of left middle finger, subsequent encounter for fracture with routine healing: Secondary | ICD-10-CM | POA: Diagnosis not present

## 2020-07-28 DIAGNOSIS — M25642 Stiffness of left hand, not elsewhere classified: Secondary | ICD-10-CM | POA: Diagnosis not present

## 2020-07-28 DIAGNOSIS — R531 Weakness: Secondary | ICD-10-CM | POA: Diagnosis not present

## 2020-07-28 DIAGNOSIS — M25542 Pain in joints of left hand: Secondary | ICD-10-CM | POA: Diagnosis not present

## 2020-08-20 ENCOUNTER — Ambulatory Visit: Payer: Medicare Other | Attending: Internal Medicine

## 2020-08-20 DIAGNOSIS — Z23 Encounter for immunization: Secondary | ICD-10-CM

## 2020-08-20 NOTE — Progress Notes (Signed)
° °  Covid-19 Vaccination Clinic  Name:  MELIZA KAGE    MRN: [id number] DOB: [birth year]  08/20/2020  Ms. Fairbairn was observed post Covid-19 immunization for 15 minutes without incident. She was provided with Vaccine Information Sheet and instruction to access the V-Safe system.   Ms. Acre was instructed to call 911 with any severe reactions post vaccine:  Difficulty breathing   Swelling of face and throat   A fast heartbeat   A bad rash all over body   Dizziness and weakness

## 2020-08-25 ENCOUNTER — Encounter: Payer: Self-pay | Admitting: Family Medicine

## 2020-08-29 ENCOUNTER — Other Ambulatory Visit: Payer: Self-pay

## 2020-08-29 ENCOUNTER — Encounter: Payer: Self-pay | Admitting: Family Medicine

## 2020-08-29 ENCOUNTER — Other Ambulatory Visit (INDEPENDENT_AMBULATORY_CARE_PROVIDER_SITE_OTHER): Payer: Medicare Other

## 2020-08-29 ENCOUNTER — Telehealth (INDEPENDENT_AMBULATORY_CARE_PROVIDER_SITE_OTHER): Payer: Medicare Other | Admitting: Family Medicine

## 2020-08-29 VITALS — Temp 97.1°F | Wt 216.0 lb

## 2020-08-29 DIAGNOSIS — R059 Cough, unspecified: Secondary | ICD-10-CM

## 2020-08-29 LAB — POC COVID19 BINAXNOW: SARS Coronavirus 2 Ag: NEGATIVE

## 2020-08-29 MED ORDER — BENZONATATE 100 MG PO CAPS: 200.0000 mg | ORAL_CAPSULE | Freq: Two times a day (BID) | ORAL | 0 refills | Status: DC | PRN

## 2020-08-29 NOTE — Progress Notes (Signed)
   Subjective:    Patient ID: [name], female    DOB: 04/02/1951, 69 y.o.   MRN: [id number]  HPI I connected with  ESHANI MAESTRE on 08/29/20 by a video enabled telemedicine application and verified that I am speaking with the correct person using two identifiers.  Caregility used.  She is at home and I am in my office I discussed the limitations of evaluation and management by telemedicine. The patient expressed understanding and agreed to proceed. She complains of a 3-day history of cough that has now become slightly more grayish productive in nature with rhinorrhea and slight sore throat.  No fever, chills, shortness of breath, earache, smell or taste change.  She has had her Covid booster on October 30.  She has been using Mucinex and Best boy.   Review of Systems     Objective:   Physical Exam Alert and in no distress.  Respiratory pattern is normal.       Assessment & Plan:  Cough She will be scheduled for a Covid test.  We will also call in Tessalon Perles.

## 2020-08-30 ENCOUNTER — Ambulatory Visit: Payer: Medicare Other | Admitting: Family Medicine

## 2020-08-30 LAB — SARS-COV-2, NAA 2 DAY TAT

## 2020-08-30 LAB — NOVEL CORONAVIRUS, NAA: SARS-CoV-2, NAA: NOT DETECTED

## 2020-09-05 ENCOUNTER — Telehealth: Payer: Self-pay | Admitting: Family Medicine

## 2020-09-05 ENCOUNTER — Other Ambulatory Visit: Payer: Self-pay | Admitting: Medical

## 2020-09-05 MED ORDER — BENZONATATE 100 MG PO CAPS: 200.0000 mg | ORAL_CAPSULE | Freq: Two times a day (BID) | ORAL | 0 refills | Status: DC | PRN

## 2020-09-05 NOTE — Telephone Encounter (Signed)
Pt advised.

## 2020-09-05 NOTE — Telephone Encounter (Signed)
Pt called and states that she saw J[facility] for a virtual. She was given cough medication. She states that he cough is beeter but is now out of cough medication. Pt uses [facility] and [facility]. Pt can be reached at [phone]. Sending to [employer] as [employer] out of office.

## 2020-09-05 NOTE — Telephone Encounter (Signed)
Medication refilled

## 2020-09-08 ENCOUNTER — Encounter: Payer: Self-pay | Admitting: Family Medicine

## 2020-09-08 ENCOUNTER — Ambulatory Visit (INDEPENDENT_AMBULATORY_CARE_PROVIDER_SITE_OTHER): Payer: Medicare Other | Admitting: Family Medicine

## 2020-09-08 ENCOUNTER — Other Ambulatory Visit: Payer: Self-pay

## 2020-09-08 DIAGNOSIS — E118 Type 2 diabetes mellitus with unspecified complications: Secondary | ICD-10-CM

## 2020-09-08 DIAGNOSIS — F32A Depression, unspecified: Secondary | ICD-10-CM

## 2020-09-08 DIAGNOSIS — I152 Hypertension secondary to endocrine disorders: Secondary | ICD-10-CM

## 2020-09-08 DIAGNOSIS — E785 Hyperlipidemia, unspecified: Secondary | ICD-10-CM | POA: Diagnosis not present

## 2020-09-08 DIAGNOSIS — E1169 Type 2 diabetes mellitus with other specified complication: Secondary | ICD-10-CM | POA: Diagnosis not present

## 2020-09-08 DIAGNOSIS — Z23 Encounter for immunization: Secondary | ICD-10-CM

## 2020-09-08 DIAGNOSIS — E1159 Type 2 diabetes mellitus with other circulatory complications: Secondary | ICD-10-CM | POA: Diagnosis not present

## 2020-09-08 DIAGNOSIS — F419 Anxiety disorder, unspecified: Secondary | ICD-10-CM | POA: Diagnosis not present

## 2020-09-08 LAB — CBC WITH DIFFERENTIAL/PLATELET
Basophils Absolute: 0 10*3/uL (ref 0.0–0.2)
Basos: 1 %
EOS (ABSOLUTE): 0.1 10*3/uL (ref 0.0–0.4)
Eos: 3 %
Hematocrit: 35.8 % (ref 34.0–46.6)
Hemoglobin: 11.5 g/dL (ref 11.1–15.9)
Immature Grans (Abs): 0 10*3/uL (ref 0.0–0.1)
Immature Granulocytes: 0 %
Lymphocytes Absolute: 1 10*3/uL (ref 0.7–3.1)
Lymphs: 22 %
MCH: 30.2 pg (ref 26.6–33.0)
MCHC: 32.1 g/dL (ref 31.5–35.7)
MCV: 94 fL (ref 79–97)
Monocytes Absolute: 0.5 10*3/uL (ref 0.1–0.9)
Monocytes: 10 %
Neutrophils Absolute: 3.1 10*3/uL (ref 1.4–7.0)
Neutrophils: 64 %
Platelets: 232 10*3/uL (ref 150–450)
RBC: 3.81 x10E6/uL (ref 3.77–5.28)
RDW: 12.8 % (ref 11.7–15.4)
WBC: 4.8 10*3/uL (ref 3.4–10.8)

## 2020-09-08 LAB — COMPREHENSIVE METABOLIC PANEL
ALT: 21 IU/L (ref 0–32)
AST: 24 IU/L (ref 0–40)
Albumin/Globulin Ratio: 1.9 (ref 1.2–2.2)
Albumin: 4.2 g/dL (ref 3.8–4.8)
Alkaline Phosphatase: 71 IU/L (ref 44–121)
BUN/Creatinine Ratio: 26 (ref 12–28)
BUN: 19 mg/dL (ref 8–27)
Bilirubin Total: 0.4 mg/dL (ref 0.0–1.2)
CO2: 28 mmol/L (ref 20–29)
Calcium: 9.6 mg/dL (ref 8.7–10.3)
Chloride: 101 mmol/L (ref 96–106)
Creatinine, Ser: 0.72 mg/dL (ref 0.57–1.00)
GFR calc Af Amer: 98 mL/min/{1.73_m2} (ref >59)
GFR calc non Af Amer: 85 mL/min/{1.73_m2} (ref >59)
Globulin, Total: 2.2 g/dL (ref 1.5–4.5)
Glucose: 133 mg/dL — ABNORMAL HIGH (ref 65–99)
Potassium: 4.6 mmol/L (ref 3.5–5.2)
Sodium: 143 mmol/L (ref 134–144)
Total Protein: 6.4 g/dL (ref 6.0–8.5)

## 2020-09-08 LAB — LIPID PANEL
Chol/HDL Ratio: 2.4 ratio (ref 0.0–4.4)
Cholesterol, Total: 130 mg/dL (ref 100–199)
HDL: 55 mg/dL (ref >39)
LDL Chol Calc (NIH): 61 mg/dL (ref 0–99)
Triglycerides: 71 mg/dL (ref 0–149)
VLDL Cholesterol Cal: 14 mg/dL (ref 5–40)

## 2020-09-08 LAB — POCT GLYCOSYLATED HEMOGLOBIN (HGB A1C): Hemoglobin A1C: 6.5 % — AB (ref 4.0–5.6)

## 2020-09-08 LAB — POCT UA - MICROALBUMIN
Albumin/Creatinine Ratio, Urine, POC: 5.6
Creatinine, POC: 136.4 mg/dL
Microalbumin Ur, POC: 7.7 mg/L

## 2020-09-08 MED ORDER — METFORMIN HCL 500 MG PO TABS: 500.0000 mg | ORAL_TABLET | Freq: Two times a day (BID) | ORAL | 1 refills | Status: DC

## 2020-09-08 MED ORDER — ATORVASTATIN CALCIUM 10 MG PO TABS: 10.0000 mg | ORAL_TABLET | Freq: Every day | ORAL | 3 refills | Status: DC

## 2020-09-08 MED ORDER — LISINOPRIL-HYDROCHLOROTHIAZIDE 20-12.5 MG PO TABS: 1.0000 | ORAL_TABLET | Freq: Every day | ORAL | 3 refills | Status: DC

## 2020-09-08 MED ORDER — PIOGLITAZONE HCL 30 MG PO TABS: 30.0000 mg | ORAL_TABLET | Freq: Every day | ORAL | 1 refills | Status: DC

## 2020-09-08 NOTE — Patient Instructions (Signed)
20 minutes of something physical daily or 150 minutes a week of something

## 2020-09-08 NOTE — Progress Notes (Signed)
Subjective:    Patient ID: [name], female    DOB: 08-Jan-1951, 69 y.o.   MRN: [id number]  [name] is a 69 y.o. female who presents for follow-up of Type 2 diabetes mellitus.  Home blood sugar records: meter records, fasting and post meal, 98- 135 Current symptoms/problems include none at this time. Daily foot checks:yes   Any foot concerns: none at this time Exercise: Minimal physical activity. Diet:fair She continues on Actos, Glucophage as well as atorvastatin and having no difficulty with any of these medications.  She is also taking lisinopril/HCTZ.  She continues on Zoloft and has no desire to quit.  She does use does role for sleep. The following portions of the patient's history were reviewed and updated as appropriate: allergies, current medications, past medical history, past social history and problem list.  ROS as in subjective above.     Objective:    Physical Exam Alert and in no distress diabetic foot exam is normal.   Lab Review Diabetic Labs Latest Ref Rng & Units 03/29/2020 03/14/2020 09/14/2019 12/30/2018 09/11/2018  HbA1c 4.0 - 5.6 % - 6.8(A) 6.2(A) 6.3(A) 6.4(A)  Microalbumin mg/L - - 7.6 - -  Micro/Creat Ratio - - - 7.5 - -  Chol 100 - 199 mg/dL - - 130 - -  HDL >39 mg/dL - - 54 - -  Calc LDL 0 - 99 mg/dL - - 60 - -  Triglycerides 0 - 149 mg/dL - - 85 - -  Creatinine 0.57 - 1.00 mg/dL 0.72 - 0.79 - -   BP/Weight 08/29/2020 06/01/2020 03/29/2020 03/28/2020 9/47/6546  Systolic BP - 503 546 568 127  Diastolic BP - 72 60 70 72  Wt. (Lbs) 216 216.8 212 212.8 212.2  BMI 38.26 38.4 37.55 37.7 37.59  Some encounter information is confidential and restricted. Go to Review Flowsheets activity to see all data.   Foot/eye exam completion dates Latest Ref Rng & Units 12/17/2019 10/12/2019  Eye Exam No Retinopathy No Retinopathy No Retinopathy  Foot Form Completion - - -  Hemoglobin A1c is 6.5  [name]  reports that she has never smoked. She has never used  smokeless tobacco. She reports that she does not drink alcohol and does not use drugs.     Assessment & Plan:    Type 2 diabetes mellitus with morbid obesity ([place]) - Plan: POCT UA - Microalbumin, POCT glycosylated hemoglobin (Hb A1C), CBC with Differential/Platelet, Comprehensive metabolic panel, Lipid panel  Hypertension associated with diabetes ([place]) - Plan: CBC with Differential/Platelet, Comprehensive metabolic panel, lisinopril-hydrochlorothiazide (ZESTORETIC) 20-12.5 MG tablet  Hyperlipidemia associated with type 2 diabetes mellitus ([place]) - Plan: Lipid panel, atorvastatin (LIPITOR) 10 MG tablet  Anxiety and depression  Need for influenza vaccination - Plan: Flu Vaccine QUAD High Dose(Fluad)  Controlled type 2 diabetes mellitus with complication, without long-term current use of insulin ([facility]) - Plan: pioglitazone (ACTOS) 30 MG tablet  Type 2 diabetes mellitus with complication, without long-term current use of insulin ([facility]) - Plan: metFORMIN (GLUCOPHAGE) 500 MG tablet   1. Rx changes: none 2. Education: Reviewed 'ABCs' of diabetes management (respective goals in parentheses):  A1C (<7), blood pressure (<130/80), and cholesterol (LDL <100). 3. Compliance at present is estimated to be fair. Efforts to improve compliance (if necessary) will be directed at increased exercise.  I again strongly encouraged her to increase her physical activity to 20 minutes daily or 150 minutes a week of something. 4. Follow up: 6 months

## 2020-09-10 DIAGNOSIS — Z1231 Encounter for screening mammogram for malignant neoplasm of breast: Secondary | ICD-10-CM | POA: Diagnosis not present

## 2020-09-12 LAB — HM MAMMOGRAPHY

## 2020-09-19 ENCOUNTER — Encounter: Payer: Self-pay | Admitting: Family Medicine

## 2020-09-29 ENCOUNTER — Other Ambulatory Visit: Payer: Self-pay

## 2020-09-29 ENCOUNTER — Telehealth: Payer: Self-pay | Admitting: Psychiatry

## 2020-09-29 DIAGNOSIS — F4323 Adjustment disorder with mixed anxiety and depressed mood: Secondary | ICD-10-CM

## 2020-09-29 MED ORDER — SERTRALINE HCL 50 MG PO TABS: 75.0000 mg | ORAL_TABLET | Freq: Every day | ORAL | 1 refills | Status: DC

## 2020-09-29 NOTE — Telephone Encounter (Signed)
Spoke with pt concerning Sertraline. Medication has 1 refill left. Pt made aware ,but Traci stated she would resubmit per pt request. Informed pt to call pharmacy not wait for them to call her.

## 2020-10-19 DIAGNOSIS — H40033 Anatomical narrow angle, bilateral: Secondary | ICD-10-CM | POA: Diagnosis not present

## 2020-10-19 DIAGNOSIS — H35372 Puckering of macula, left eye: Secondary | ICD-10-CM | POA: Diagnosis not present

## 2020-10-19 DIAGNOSIS — E119 Type 2 diabetes mellitus without complications: Secondary | ICD-10-CM | POA: Diagnosis not present

## 2020-10-19 DIAGNOSIS — H43811 Vitreous degeneration, right eye: Secondary | ICD-10-CM | POA: Diagnosis not present

## 2020-10-19 DIAGNOSIS — H26493 Other secondary cataract, bilateral: Secondary | ICD-10-CM | POA: Diagnosis not present

## 2020-10-19 DIAGNOSIS — Z961 Presence of intraocular lens: Secondary | ICD-10-CM | POA: Diagnosis not present

## 2020-11-01 ENCOUNTER — Other Ambulatory Visit: Payer: Self-pay | Admitting: Family Medicine

## 2020-11-01 DIAGNOSIS — E1159 Type 2 diabetes mellitus with other circulatory complications: Secondary | ICD-10-CM

## 2020-11-01 DIAGNOSIS — E118 Type 2 diabetes mellitus with unspecified complications: Secondary | ICD-10-CM

## 2020-11-21 DIAGNOSIS — D229 Melanocytic nevi, unspecified: Secondary | ICD-10-CM | POA: Diagnosis not present

## 2020-11-21 DIAGNOSIS — L821 Other seborrheic keratosis: Secondary | ICD-10-CM | POA: Diagnosis not present

## 2020-11-21 DIAGNOSIS — L905 Scar conditions and fibrosis of skin: Secondary | ICD-10-CM | POA: Diagnosis not present

## 2020-11-21 DIAGNOSIS — L814 Other melanin hyperpigmentation: Secondary | ICD-10-CM | POA: Diagnosis not present

## 2020-11-21 DIAGNOSIS — Z85828 Personal history of other malignant neoplasm of skin: Secondary | ICD-10-CM | POA: Diagnosis not present

## 2020-11-21 DIAGNOSIS — D1801 Hemangioma of skin and subcutaneous tissue: Secondary | ICD-10-CM | POA: Diagnosis not present

## 2020-11-21 DIAGNOSIS — L570 Actinic keratosis: Secondary | ICD-10-CM | POA: Diagnosis not present

## 2020-11-21 DIAGNOSIS — I8393 Asymptomatic varicose veins of bilateral lower extremities: Secondary | ICD-10-CM | POA: Diagnosis not present

## 2020-12-02 ENCOUNTER — Other Ambulatory Visit: Payer: Self-pay | Admitting: Family Medicine

## 2020-12-02 DIAGNOSIS — E118 Type 2 diabetes mellitus with unspecified complications: Secondary | ICD-10-CM

## 2020-12-02 NOTE — Telephone Encounter (Signed)
This was refilled for 6 months back in 11/21

## 2020-12-16 ENCOUNTER — Encounter (INDEPENDENT_AMBULATORY_CARE_PROVIDER_SITE_OTHER): Payer: Medicare Other | Admitting: Ophthalmology

## 2020-12-21 ENCOUNTER — Other Ambulatory Visit: Payer: Self-pay

## 2020-12-21 ENCOUNTER — Encounter (INDEPENDENT_AMBULATORY_CARE_PROVIDER_SITE_OTHER): Payer: Medicare Other | Admitting: Ophthalmology

## 2020-12-21 DIAGNOSIS — H353112 Nonexudative age-related macular degeneration, right eye, intermediate dry stage: Secondary | ICD-10-CM | POA: Diagnosis not present

## 2020-12-21 DIAGNOSIS — H43813 Vitreous degeneration, bilateral: Secondary | ICD-10-CM

## 2020-12-21 DIAGNOSIS — H35372 Puckering of macula, left eye: Secondary | ICD-10-CM | POA: Diagnosis not present

## 2020-12-21 DIAGNOSIS — H35033 Hypertensive retinopathy, bilateral: Secondary | ICD-10-CM

## 2020-12-21 DIAGNOSIS — I10 Essential (primary) hypertension: Secondary | ICD-10-CM | POA: Diagnosis not present

## 2020-12-21 LAB — HM DIABETES EYE EXAM

## 2021-01-23 ENCOUNTER — Other Ambulatory Visit: Payer: Self-pay

## 2021-01-23 ENCOUNTER — Encounter: Payer: Self-pay | Admitting: Psychiatry

## 2021-01-23 ENCOUNTER — Ambulatory Visit (INDEPENDENT_AMBULATORY_CARE_PROVIDER_SITE_OTHER): Payer: Medicare Other | Admitting: Psychiatry

## 2021-01-23 DIAGNOSIS — F4323 Adjustment disorder with mixed anxiety and depressed mood: Secondary | ICD-10-CM | POA: Diagnosis not present

## 2021-01-23 MED ORDER — SERTRALINE HCL 50 MG PO TABS: ORAL_TABLET | ORAL | 1 refills | Status: DC

## 2021-01-23 NOTE — Progress Notes (Signed)
[name] [id number] 05-Aug-1951 70 y.o.  Subjective:   Patient ID:  [name] is a 70 y.o. (DOB Mar 14, 1951) female.  Chief Complaint:  Chief Complaint  Patient presents with  . Follow-up    Anxiety, depression    HPI [name] presents to the office today for follow-up of anxiety and h/o depression. Husband had shoulder surgery in late February and she was driving for him and taking care of him. She reports that her anxiety has been ok. She repots that she had some slight anxiety at the end of the month with managing finances. She denies depressed mood or irritability. She reports that she is sleeping ok. Appetite has been good. She reports that her energy and motivation have been good. Denies impaired concentration. Denies SI.   She reports that she rarely takes Trazodone prn.   Has a trip planned to [place] later this month for a wedding.   Past Psychiatric Medication Trials: Sertraline Melatonin Trazodone  PHQ2-9   Flowsheet Row Office Visit from 03/14/2020 in [place] Visit from 12/30/2018 in [place] Visit from 05/12/2018 in [place] Visit from 12/26/2016 in [place] Visit from 09/13/2015 in [place]  PHQ-2 Total Score 0 0 0 0 0       Review of Systems:  Review of Systems  Gastrointestinal: Negative.   Musculoskeletal: Negative for gait problem.  Neurological: Negative for tremors.  Psychiatric/Behavioral:       Please refer to HPI    Medications: I have reviewed the patient's current medications.  Current Outpatient Medications  Medication Sig Dispense Refill  . acetaminophen (TYLENOL) 325 MG tablet Take 325-650 mg by mouth every 6 (six) hours as needed. Reported on 04/26/2016    . aspirin EC 81 MG tablet Take 81 mg by mouth daily.    Marland Kitchen atorvastatin (LIPITOR) 10 MG tablet Take 1 tablet (10 mg total) by mouth daily. 90 tablet 3  . betamethasone valerate  lotion (VALISONE) 0.1 % Apply 1 application topically 2 (two) times daily as needed.    . calcium carbonate (OS-CAL) 600 MG TABS Take 600 mg by mouth 2 (two) times daily with a meal.    . Cholecalciferol (VITAMIN D) 2000 UNITS tablet Take 2,000 Units by mouth daily.    . clobetasol (TEMOVATE) 0.05 % external solution APPLY TO SCALP DAILY.  2  . KRILL OIL 1000 MG CAPS Take 1,000 mg by mouth every evening.    Marland Kitchen lisinopril-hydrochlorothiazide (ZESTORETIC) 20-12.5 MG tablet TAKE 1 TABLET BY MOUTH DAILY 90 tablet 3  . loratadine (CLARITIN) 10 MG tablet Take 10 mg by mouth daily as needed for allergies.    . metFORMIN (GLUCOPHAGE) 500 MG tablet Take 1 tablet (500 mg total) by mouth 2 (two) times daily with a meal. 180 tablet 1  . Misc Natural Products (COSAMIN ASU ADVANCED FORMULA) CAPS Take 2 each by mouth 2 (two) times daily.    . mometasone (ELOCON) 0.1 % cream APPLY TO EXTERNAL EAR DAILY AS NEEDED FOR ITCHING OF DRY SKIN    . Multiple Vitamins-Minerals (MULTIVITAMIN WITH MINERALS) tablet Take 1 tablet by mouth daily.    . Multiple Vitamins-Minerals (PRESERVISION AREDS 2+MULTI VIT PO) Take by mouth.    Glory Rosebush ULTRA test strip TEST ONCE DAILY AS DIRECTED 100 strip 3  . pioglitazone (ACTOS) 30 MG tablet TAKE 1 TABLET BY MOUTH DAILY 90 tablet 1  . traZODone (DESYREL) 100 MG tablet Take  1/2-1 tablet po QHS prn insomnia 90 tablet 1  . benzonatate (TESSALON) 100 MG capsule Take 2 capsules (200 mg total) by mouth 2 (two) times daily as needed for cough. (Patient not taking: Reported on 01/23/2021) 20 capsule 0  . Lancets (ONETOUCH ULTRASOFT) lancets USE AS DIRECTED TO TEST ONCE DAILY 200 each 0  . mupirocin ointment (BACTROBAN) 2 % Place 1 application into the nose 2 (two) times daily. 22 g 1  . sertraline (ZOLOFT) 50 MG tablet Take 1-1.5 tabs po QHS 135 tablet 1   No current facility-administered medications for this visit.    Medication Side Effects: None  Allergies: No Known Allergies  Past  Medical History:  Diagnosis Date  . Basal cell carcinoma of nose 11/30/13   [facility]   . Diabetes mellitus   . Hyperlipidemia   . Hypertension   . Obesity     Family History  Problem Relation Age of Onset  . Asthma Other   . Hypertension Other   . Diabetes Other   . Stroke Father   . Diabetes Father     Social History   Socioeconomic History  . Marital status: Married    Spouse name: Not on file  . Number of children: 2  . Years of education: Not on file  . Highest education level: Not on file  Occupational History  . Occupation: Personal assistant: [employer]  Tobacco Use  . Smoking status: Never Smoker  . Smokeless tobacco: Never Used  Substance and Sexual Activity  . Alcohol use: No  . Drug use: No  . Sexual activity: Yes    Birth control/protection: Surgical    Comment: hysterectomy  Other Topics Concern  . Not on file  Social History Narrative   Lives with [name].  Two children and two grands.     Social Determinants of Health   Financial Resource Strain: Not on file  Food Insecurity: Not on file  Transportation Needs: Not on file  Physical Activity: Not on file  Stress: Not on file  Social Connections: Not on file  Intimate Partner Violence: Not on file    Past Medical History, Surgical history, Social history, and Family history were reviewed and updated as appropriate.   Please see review of systems for further details on the patient's review from today.   Objective:   Physical Exam:  There were no vitals taken for this visit.  Physical Exam Constitutional:      General: She is not in acute distress. Musculoskeletal:        General: No deformity.  Neurological:     Mental Status: She is alert and oriented to person, place, and time.     Coordination: Coordination normal.  Psychiatric:        Attention and Perception: Attention and perception normal. She does not perceive auditory or visual hallucinations.        Mood  and Affect: Mood normal. Mood is not anxious or depressed. Affect is not labile, blunt, angry or inappropriate.        Speech: Speech normal.        Behavior: Behavior normal.        Thought Content: Thought content normal. Thought content is not paranoid or delusional. Thought content does not include homicidal or suicidal ideation. Thought content does not include homicidal or suicidal plan.        Cognition and Memory: Cognition and memory normal.  Judgment: Judgment normal.     Comments: Insight intact     Lab Review:     Component Value Date/Time   NA 143 09/08/2020 1002   K 4.6 09/08/2020 1002   CL 101 09/08/2020 1002   CO2 28 09/08/2020 1002   GLUCOSE 133 (H) 09/08/2020 1002   GLUCOSE 117 (H) 05/01/2017 0813   BUN 19 09/08/2020 1002   CREATININE 0.72 09/08/2020 1002   CREATININE 0.80 05/01/2017 0813   CALCIUM 9.6 09/08/2020 1002   CALCIUM 8.9 10/13/2012 0500   PROT 6.4 09/08/2020 1002   ALBUMIN 4.2 09/08/2020 1002   AST 24 09/08/2020 1002   ALT 21 09/08/2020 1002   ALKPHOS 71 09/08/2020 1002   BILITOT 0.4 09/08/2020 1002   GFRNONAA 85 09/08/2020 1002   GFRAA 98 09/08/2020 1002       Component Value Date/Time   WBC 4.8 09/08/2020 1002   WBC 4.1 05/01/2017 0813   RBC 3.81 09/08/2020 1002   RBC 3.85 05/01/2017 0813   HGB 11.5 09/08/2020 1002   HCT 35.8 09/08/2020 1002   PLT 232 09/08/2020 1002   MCV 94 09/08/2020 1002   MCH 30.2 09/08/2020 1002   MCH 29.6 05/01/2017 0813   MCHC 32.1 09/08/2020 1002   MCHC 31.9 (L) 05/01/2017 0813   RDW 12.8 09/08/2020 1002   LYMPHSABS 1.0 09/08/2020 1002   MONOABS 287 05/01/2017 0813   EOSABS 0.1 09/08/2020 1002   BASOSABS 0.0 09/08/2020 1002    No results found for: POCLITH, LITHIUM   No results found for: PHENYTOIN, PHENOBARB, VALPROATE, CBMZ   .res Assessment: Plan:   Pt seen for 30 minutes and time spent counseling pt regarding possibly reducing Sertraline since she has not had any recent anxiety or  depressive s/s, past s/s seemed to be mostly situational, and she is currently not experiencing any acute psychosocial stressors. Pt agrees to dose reduction. Discussed decreasing Sertraline from 75 mg to 50 mg. Discussed that script would be written for Sertraline 50 mg 1-1.5 tabs po qd so that she would have the ability to resume 75 mg dose if she notices any worsening mood or anxiety s/s or decides to postpone dose reduction.  Will continue Trazodone 100 mg 1/2-1 tab po QHS prn insomnia. She reports that she does not need a refill at this time.  Pt to follow-up in 6 months or sooner if clinically indicated. Patient advised to contact office with any questions, adverse effects, or acute worsening in signs and symptoms.  Taylour was seen today for follow-up.  Diagnoses and all orders for this visit:  Adjustment disorder with mixed anxiety and depressed mood -     sertraline (ZOLOFT) 50 MG tablet; Take 1-1.5 tabs po QHS     Please see After Visit Summary for patient specific instructions.  Future Appointments  Date Time Provider [place]  03/15/2021  9:30 AM Denita Lung, MD [facility] [place]  07/25/2021  9:00 AM Thayer Headings, PMHNP CP[facility]CP None  12/20/2021  9:15 AM Hayden Pedro, MD [facility] None    No orders of the defined types were placed in this encounter.   -------------------------------

## 2021-03-15 ENCOUNTER — Ambulatory Visit (INDEPENDENT_AMBULATORY_CARE_PROVIDER_SITE_OTHER): Payer: Medicare Other | Admitting: Family Medicine

## 2021-03-15 ENCOUNTER — Other Ambulatory Visit: Payer: Self-pay

## 2021-03-15 ENCOUNTER — Encounter: Payer: Self-pay | Admitting: Family Medicine

## 2021-03-15 VITALS — BP 138/82 | HR 82 | Temp 97.8°F | Ht 63.0 in | Wt 219.0 lb

## 2021-03-15 DIAGNOSIS — I152 Hypertension secondary to endocrine disorders: Secondary | ICD-10-CM | POA: Diagnosis not present

## 2021-03-15 DIAGNOSIS — M8580 Other specified disorders of bone density and structure, unspecified site: Secondary | ICD-10-CM | POA: Diagnosis not present

## 2021-03-15 DIAGNOSIS — H409 Unspecified glaucoma: Secondary | ICD-10-CM | POA: Diagnosis not present

## 2021-03-15 DIAGNOSIS — F32A Depression, unspecified: Secondary | ICD-10-CM

## 2021-03-15 DIAGNOSIS — F419 Anxiety disorder, unspecified: Secondary | ICD-10-CM | POA: Diagnosis not present

## 2021-03-15 DIAGNOSIS — E785 Hyperlipidemia, unspecified: Secondary | ICD-10-CM | POA: Diagnosis not present

## 2021-03-15 DIAGNOSIS — Z9849 Cataract extraction status, unspecified eye: Secondary | ICD-10-CM

## 2021-03-15 DIAGNOSIS — E1159 Type 2 diabetes mellitus with other circulatory complications: Secondary | ICD-10-CM | POA: Diagnosis not present

## 2021-03-15 DIAGNOSIS — E118 Type 2 diabetes mellitus with unspecified complications: Secondary | ICD-10-CM

## 2021-03-15 DIAGNOSIS — J301 Allergic rhinitis due to pollen: Secondary | ICD-10-CM | POA: Diagnosis not present

## 2021-03-15 DIAGNOSIS — E1169 Type 2 diabetes mellitus with other specified complication: Secondary | ICD-10-CM | POA: Diagnosis not present

## 2021-03-15 DIAGNOSIS — Z23 Encounter for immunization: Secondary | ICD-10-CM | POA: Diagnosis not present

## 2021-03-15 DIAGNOSIS — Z0000 Encounter for general adult medical examination without abnormal findings: Secondary | ICD-10-CM

## 2021-03-15 DIAGNOSIS — I493 Ventricular premature depolarization: Secondary | ICD-10-CM

## 2021-03-15 LAB — POCT GLYCOSYLATED HEMOGLOBIN (HGB A1C): Hemoglobin A1C: 6.3 % — AB (ref 4.0–5.6)

## 2021-03-15 MED ORDER — METFORMIN HCL 500 MG PO TABS: 500.0000 mg | ORAL_TABLET | Freq: Two times a day (BID) | ORAL | 1 refills | Status: DC

## 2021-03-15 MED ORDER — PIOGLITAZONE HCL 30 MG PO TABS: 30.0000 mg | ORAL_TABLET | Freq: Every day | ORAL | 1 refills | Status: DC

## 2021-03-15 NOTE — Progress Notes (Signed)
[name] is a 70 y.o. female who presents for annual wellness visit,CPE and follow-up on chronic medical conditions.  She has no particular concerns or complaints.  She does have underlying diabetes and presently is taking Actos and metformin and having no difficulty with that.  Her weight is unchanged.  She continues on lisinopril/HCTZ for her blood pressure.  Continues to do quite nicely on Zoloft.  She was given this when she was going through a lot of stress of moving.  She has reduced this to 1 pill/day.  Continues on Lipitor and having no difficulty with that.  She rarely uses trazodone for sleep.  She sees her ophthalmologist regularly she does have glaucoma and has had a cataract extraction..  She has seen cardiology in the past for her PVCs but presently they are not causing any difficulty.  Her allergies are under good control.  Immunizations and Health Maintenance Immunization History  Administered Date(s) Administered  . Fluad Quad(high Dose 65+) 09/14/2019, 09/08/2020  . Influenza Split 06/30/2012, 08/02/2013  . Influenza Whole 06/27/2011  . Influenza, High Dose Seasonal PF 09/13/2015, 08/28/2016, 07/01/2017, 09/11/2018  . Influenza,inj,Quad PF,6+ Mos 06/10/2014  . PFIZER Comirnaty(Gray Top)Covid-19 Tri-Sucrose Vaccine 03/15/2021  . PFIZER(Purple Top)SARS-COV-2 Vaccination 12/19/2019, 01/09/2020, 08/20/2020  . Pneumococcal Conjugate-13 09/07/2014, 05/01/2017  . Pneumococcal Polysaccharide-23 02/28/2012  . Tdap 01/23/2007, 05/01/2017  . Zoster 01/23/2007  . Zoster Recombinat (Shingrix) 08/01/2017, 12/19/2017   Health Maintenance Due  Topic Date Due  . OPHTHALMOLOGY EXAM  12/16/2020    Last Pap smear: Aged out  Last mammogram: 09/12/20 Last colonoscopy: 12/11/19 Last DEXA: 05/16/15 Dentist: Q six months Ophtho: Q six months Exercise: N/A  Other doctors caring for patient include: Dr. Katy Fitch eye, Dr. Zigmund Daniel eye , Dr. Pearline Cables dermatology, Dr. Jenkins [place] cardiology , Thayer Headings NP   Advanced directives: Does Patient Have a Medical Advance Directive?: Yes Type of Advance Directive: Living will Does patient want to make changes to medical advance directive?: No - Patient declined  Depression screen:  See questionnaire below.  Depression screen [facility] 2/9 03/15/2021 03/14/2020 12/30/2018 05/12/2018 12/26/2016  Decreased Interest 0 0 0 0 0  Down, Depressed, Hopeless 0 0 0 0 0  PHQ - 2 Score 0 0 0 0 0    Fall Risk Screen: see questionnaire below. Fall Risk  03/15/2021 03/14/2020 12/30/2018 05/22/2018 05/12/2018  Falls in the past year? 0 0 0 No No  Comment - - - [place]i Telephone Survey: data to providers prior to load -  Number falls in past yr: 0 0 - - -  Injury with Fall? 0 - - - -  Risk for fall due to : No Fall Risks - - - -    ADL screen:  See questionnaire below Functional Status Survey: Is the patient deaf or have difficulty hearing?: No Does the patient have difficulty seeing, even when wearing glasses/contacts?: No Does the patient have difficulty concentrating, remembering, or making decisions?: No Does the patient have difficulty walking or climbing stairs?: No Does the patient have difficulty dressing or bathing?: No Does the patient have difficulty doing errands alone such as visiting a doctor's office or shopping?: No   Review of Systems Constitutional: -, -unexpected weight change, -anorexia, -fatigue Allergy: -sneezing, -itching, -congestion Dermatology: denies changing moles, rash, lumps ENT: -runny nose, -ear pain, -sore throat,  Cardiology:  -chest pain, -palpitations, -orthopnea, Respiratory: -cough, -shortness of breath, -dyspnea on exertion, -wheezing,  Gastroenterology: -abdominal pain, -nausea, -vomiting, -diarrhea, -constipation, -dysphagia Hematology: -bleeding or bruising  problems Musculoskeletal: -arthralgias, -myalgias, -joint swelling, -back pain, - Ophthalmology: -vision changes,  Urology: -dysuria, -difficulty urinating,   -urinary frequency, -urgency, incontinence Neurology: -, -numbness, , -memory loss, -falls, -dizziness    PHYSICAL EXAM: General Appearance: Alert, cooperative, no distress, appears stated age Head: Normocephalic, without obvious abnormality, atraumatic Eyes: PERRL, conjunctiva/corneas clear, EOM's intact, Ears: Normal TM's and external ear canals Nose: Nares normal, mucosa normal, no drainage or sinus tenderness Throat: Lips, mucosa, and tongue normal; teeth and gums normal Neck: Supple, no lymphadenopathy;  thyroid:  no enlargement/tenderness/nodules; no carotid bruit or JVD Lungs: Clear to auscultation bilaterally without wheezes, rales or ronchi; respirations unlabored Heart: Regular rate and rhythm, S1 and S2 normal, no murmur, rubor gallop Abdomen: Soft, non-tender, nondistended, normoactive bowel sounds,  no masses, no hepatosplenomegaly Skin:  Skin color, texture, turgor normal, no rashes or lesions Lymph nodes: Cervical, supraclavicular, and axillary nodes normal Neurologic:  CNII-XII intact, normal strength, sensation and gait; reflexes 2+ and symmetric throughout Psych: Normal mood, affect, hygiene and grooming. Hemoglobin A1c is 6.3 ASSESSMENT/PLAN: Controlled type 2 diabetes mellitus with complication, without long-term current use of insulin (HCC) - Plan: pioglitazone (ACTOS) 30 MG tablet  Hypertension associated with diabetes ([place])  Hyperlipidemia associated with type 2 diabetes mellitus ([place])  Type 2 diabetes mellitus with morbid obesity ([place]) - Plan: POCT glycosylated hemoglobin (Hb A1C)  Anxiety and depression  Glaucoma, unspecified glaucoma type, unspecified laterality  Status post cataract extraction, unspecified laterality  Seasonal allergic rhinitis due to pollen  PVC (premature ventricular contraction)  Need for viral immunization - Plan: PFIZER Comirnaty(GRAY TOP)COVID-19 Vaccine  Osteopenia, unspecified location - Plan: DG Bone Density  Type 2  diabetes mellitus with complication, without long-term current use of insulin ([place]) - Plan: metFORMIN (GLUCOPHAGE) 500 MG tablet She will continue on her present medication regimen.  Bone density scan ordered.  She will continue on her dose of Zoloft at the present level since she sees be doing well on that and may end up stopping that in another year.  Discussed  at least 30 minutes of aerobic activity at least 5 days/week and weight-bearing exercise 2x/week healthy diet, including goals of calcium and vitamin D intakeImmunization recommendations discussed.  Colonoscopy recommendations reviewed   Medicare Attestation I have personally reviewed: The patient's medical and social history Their use of alcohol, tobacco or illicit drugs Their current medications and supplements The patient's functional ability including ADLs,fall risks, home safety risks, cognitive, and hearing and visual impairment Diet and physical activities Evidence for depression or mood disorders  The patient's weight, height, and BMI have been recorded in the chart.  I have made referrals, counseling, and provided education to the patient based on review of the above and I have provided the patient with a written personalized care plan for preventive services.     Jill Alexanders, MD   03/15/2021

## 2021-03-16 ENCOUNTER — Ambulatory Visit: Payer: Medicare Other | Admitting: Family Medicine

## 2021-03-23 ENCOUNTER — Other Ambulatory Visit: Payer: Self-pay | Admitting: Family Medicine

## 2021-03-30 DIAGNOSIS — H0019 Chalazion unspecified eye, unspecified eyelid: Secondary | ICD-10-CM | POA: Diagnosis not present

## 2021-04-02 ENCOUNTER — Encounter: Payer: Self-pay | Admitting: Family Medicine

## 2021-04-11 DIAGNOSIS — Z780 Asymptomatic menopausal state: Secondary | ICD-10-CM | POA: Diagnosis not present

## 2021-04-11 DIAGNOSIS — M8589 Other specified disorders of bone density and structure, multiple sites: Secondary | ICD-10-CM | POA: Diagnosis not present

## 2021-04-11 LAB — HM DEXA SCAN

## 2021-04-13 DIAGNOSIS — M8580 Other specified disorders of bone density and structure, unspecified site: Secondary | ICD-10-CM

## 2021-04-13 NOTE — Progress Notes (Signed)
Pt was made aware of the need to increase CA and vitamin D per [employer]

## 2021-04-19 ENCOUNTER — Telehealth: Payer: Self-pay

## 2021-04-19 NOTE — Telephone Encounter (Signed)
Pt was advised of dexa scan results and to take vitamin d  and calcium.

## 2021-05-02 ENCOUNTER — Other Ambulatory Visit: Payer: Self-pay | Admitting: Family Medicine

## 2021-06-11 DIAGNOSIS — Z20822 Contact with and (suspected) exposure to covid-19: Secondary | ICD-10-CM | POA: Diagnosis not present

## 2021-06-12 ENCOUNTER — Telehealth: Payer: Self-pay | Admitting: Family Medicine

## 2021-06-12 NOTE — Telephone Encounter (Signed)
Pt called and wanted to let you know she tested positive on yesterday she declined a visit today just wanted to let you know. Pt advised to call us if her symptoms got worse.

## 2021-06-17 DIAGNOSIS — Z20822 Contact with and (suspected) exposure to covid-19: Secondary | ICD-10-CM | POA: Diagnosis not present

## 2021-07-03 DIAGNOSIS — Z20822 Contact with and (suspected) exposure to covid-19: Secondary | ICD-10-CM | POA: Diagnosis not present

## 2021-07-25 ENCOUNTER — Encounter: Payer: Self-pay | Admitting: Psychiatry

## 2021-07-25 ENCOUNTER — Other Ambulatory Visit: Payer: Self-pay

## 2021-07-25 ENCOUNTER — Ambulatory Visit: Payer: Medicare Other | Admitting: Psychiatry

## 2021-07-25 DIAGNOSIS — F4323 Adjustment disorder with mixed anxiety and depressed mood: Secondary | ICD-10-CM

## 2021-07-25 MED ORDER — SERTRALINE HCL 50 MG PO TABS: ORAL_TABLET | ORAL | 1 refills | Status: DC

## 2021-07-25 NOTE — Progress Notes (Signed)
[name] [id number] 08-Mar-1951 70 y.o.  Subjective:   Patient ID:  [name] is a 70 y.o. (DOB [birth year]) female.  Chief Complaint:  Chief Complaint  Patient presents with   Follow-up    H/o anxiety and insomnia    HPI [name] presents to the office today for follow-up of anxiety, depression, and insomnia. "Doing good... I was thinking about maybe coming off the medication." Did not notice any worsening mood or anxiety with decrease in Sertraline from 75 mg to 50 mg po qd. Has not taken Trazodone recently. Denies depressed mood or anxiety. She reports that energy is ok unless she has disrupted sleep. She reports leg pain one evening kept her awake. Typically sleeping well. Motivation has been good. She is getting checkbook and finances in order. Concentration is adequate. Denies SI.   Traveled to [place] in August for a family reunion and they got COVID afterwards. Also traveled in the spring for a wedding. She and her daughter just recently took a trip.   Has started a new Bible study in addition to the one she has done.   Past Psychiatric Medication Trials: Sertraline Melatonin Trazodone  PHQ2-9    Flowsheet Row Office Visit from 03/15/2021 in [place] Visit from 03/14/2020 in [place] Visit from 12/30/2018 in [place] Visit from 05/12/2018 in [place] Visit from 12/26/2016 in [place]  PHQ-2 Total Score 0 0 0 0 0        Review of Systems:  Review of Systems  Gastrointestinal: Negative.   Musculoskeletal:  Negative for gait problem.  Neurological:        Intermittent hand tremor  Psychiatric/Behavioral:         Please refer to HPI   Medications: I have reviewed the patient's current medications.  Current Outpatient Medications  Medication Sig Dispense Refill   acetaminophen (TYLENOL) 325 MG tablet Take 325-650 mg by mouth every 6 (six) hours as needed.  Reported on 04/26/2016     aspirin EC 81 MG tablet Take 81 mg by mouth daily.     atorvastatin (LIPITOR) 10 MG tablet Take 1 tablet (10 mg total) by mouth daily. 90 tablet 3   betamethasone valerate lotion (VALISONE) 0.1 % Apply 1 application topically 2 (two) times daily as needed.     calcium carbonate (OS-CAL) 600 MG TABS Take 600 mg by mouth 2 (two) times daily with a meal.     Cholecalciferol (VITAMIN D) 2000 UNITS tablet Take 2,000 Units by mouth daily.     clobetasol (TEMOVATE) 0.05 % external solution Uses as needed  2   KRILL OIL 1000 MG CAPS Take 1,000 mg by mouth every evening.     lisinopril-hydrochlorothiazide (ZESTORETIC) 20-12.5 MG tablet TAKE 1 TABLET BY MOUTH DAILY 90 tablet 3   loratadine (CLARITIN) 10 MG tablet Take 10 mg by mouth daily as needed for allergies.     metFORMIN (GLUCOPHAGE) 500 MG tablet Take 1 tablet (500 mg total) by mouth 2 (two) times daily with a meal. 180 tablet 1   Misc Natural Products (COSAMIN ASU ADVANCED FORMULA) CAPS Take 2 each by mouth 2 (two) times daily.     mometasone (ELOCON) 0.1 % cream APPLY TO EXTERNAL EAR DAILY AS NEEDED FOR ITCHING OF DRY SKIN     Multiple Vitamins-Minerals (MULTIVITAMIN WITH MINERALS) tablet Take 1 tablet by mouth daily.     Multiple Vitamins-Minerals (PRESERVISION AREDS 2+MULTI VIT PO)  Take by mouth.     Omega 3 1000 MG CAPS 1 capsule     pioglitazone (ACTOS) 30 MG tablet Take 1 tablet (30 mg total) by mouth daily. 90 tablet 1   Lancets (ONETOUCH ULTRASOFT) lancets USE AS DIRECTED TO TEST ONCE DAILY 200 each 0   mupirocin ointment (BACTROBAN) 2 % Place 1 application into the nose 2 (two) times daily. 22 g 1   ONETOUCH ULTRA test strip TEST ONCE DAILY AS DIRECTED 100 strip 3   sertraline (ZOLOFT) 50 MG tablet Take 1/2 tablet daily for 2 weeks (or until current supply is completed), then stop 135 tablet 1   No current facility-administered medications for this visit.    Medication Side Effects: None  Allergies: No Known  Allergies  Past Medical History:  Diagnosis Date   Basal cell carcinoma of nose 11/30/13   [facility]    Diabetes mellitus    Hyperlipidemia    Hypertension    Obesity     Past Medical History, Surgical history, Social history, and Family history were reviewed and updated as appropriate.   Please see review of systems for further details on the patient's review from today.   Objective:   Physical Exam:  There were no vitals taken for this visit.  Physical Exam Constitutional:      General: She is not in acute distress. Musculoskeletal:        General: No deformity.  Neurological:     Mental Status: She is alert and oriented to person, place, and time.     Coordination: Coordination normal.  Psychiatric:        Attention and Perception: Attention and perception normal. She does not perceive auditory or visual hallucinations.        Mood and Affect: Mood normal. Mood is not anxious or depressed. Affect is not labile, blunt, angry or inappropriate.        Speech: Speech normal.        Behavior: Behavior normal.        Thought Content: Thought content normal. Thought content is not paranoid or delusional. Thought content does not include homicidal or suicidal ideation. Thought content does not include homicidal or suicidal plan.        Cognition and Memory: Cognition and memory normal.        Judgment: Judgment normal.     Comments: Insight intact    Lab Review:     Component Value Date/Time   NA 143 09/08/2020 1002   K 4.6 09/08/2020 1002   CL 101 09/08/2020 1002   CO2 28 09/08/2020 1002   GLUCOSE 133 (H) 09/08/2020 1002   GLUCOSE 117 (H) 05/01/2017 0813   BUN 19 09/08/2020 1002   CREATININE 0.72 09/08/2020 1002   CREATININE 0.80 05/01/2017 0813   CALCIUM 9.6 09/08/2020 1002   CALCIUM 8.9 10/13/2012 0500   PROT 6.4 09/08/2020 1002   ALBUMIN 4.2 09/08/2020 1002   AST 24 09/08/2020 1002   ALT 21 09/08/2020 1002   ALKPHOS 71 09/08/2020 1002   BILITOT  0.4 09/08/2020 1002   GFRNONAA 85 09/08/2020 1002   GFRAA 98 09/08/2020 1002       Component Value Date/Time   WBC 4.8 09/08/2020 1002   WBC 4.1 05/01/2017 0813   RBC 3.81 09/08/2020 1002   RBC 3.85 05/01/2017 0813   HGB 11.5 09/08/2020 1002   HCT 35.8 09/08/2020 1002   PLT 232 09/08/2020 1002   MCV 94 09/08/2020 1002  MCH 30.2 09/08/2020 1002   MCH 29.6 05/01/2017 0813   MCHC 32.1 09/08/2020 1002   MCHC 31.9 (L) 05/01/2017 0813   RDW 12.8 09/08/2020 1002   LYMPHSABS 1.0 09/08/2020 1002   MONOABS 287 05/01/2017 0813   EOSABS 0.1 09/08/2020 1002   BASOSABS 0.0 09/08/2020 1002    No results found for: POCLITH, LITHIUM   No results found for: PHENYTOIN, PHENOBARB, VALPROATE, CBMZ   .res Assessment: Plan:   Pt seen for 30 minutes and time spent discussing option to discontinue medication since she has not had any recent mood or anxiety s/s and since past mood and anxiety s/s were in response to multiple psychosocial stressors that have resolved. Pt agrees to decrease and discontinuation of Sertraline. Will decrease Sertraline to 50 mg 1/2 tab for 2 weeks (or until current supply is completed), then stop. Discussed monitoring for s/s of recurrence and encouraged her to contact office if she experiences any recurrence of s/s to schedule apt and/or request script for Sertraline.  Will remove Trazodone prn from med list since she has not used Trazodone recently and has been sleeping well overall without medication.  Pt to follow-up on an as needed basis.   Davina was seen today for follow-up.  Diagnoses and all orders for this visit:  Adjustment disorder with mixed anxiety and depressed mood Comments: Resolved Orders: -     sertraline (ZOLOFT) 50 MG tablet; Take 1/2 tablet daily for 2 weeks (or until current supply is completed), then stop    Please see After Visit Summary for patient specific instructions.  Future Appointments  Date Time Provider [place]   09/18/2021  9:15 AM Denita Lung, MD [facility]  12/20/2021  9:15 AM Hayden Pedro, MD [facility] None  03/30/2022  8:30 AM Denita Lung, MD [facility]    No orders of the defined types were placed in this encounter.   -------------------------------

## 2021-08-25 ENCOUNTER — Other Ambulatory Visit: Payer: Self-pay | Admitting: Family Medicine

## 2021-08-25 DIAGNOSIS — E1169 Type 2 diabetes mellitus with other specified complication: Secondary | ICD-10-CM

## 2021-09-17 NOTE — Progress Notes (Signed)
  Subjective:    Patient ID: [name], female    DOB: 1950/12/05, 70 y.o.   MRN: [id number]  [name] is a 70 y.o. female who presents for follow-up of Type 2 diabetes mellitus.  Home blood sugar records:  fasting avg. 125 Current symptoms/problems include none and have been little changes. Daily foot checks:yes   Any foot concerns: none Exercise:  very little right now Diet: fair.  She admits to dietary indiscretion in regard to cookies etc. She continues on metformin and pioglitazone and is having no difficulty with that. Atorvastatin is causing no difficulty.  She continues on lisinopril/HCTZ. The following portions of the patient's history were reviewed and updated as appropriate: allergies, current medications, past medical history, past social history and problem list.  ROS as in subjective above.     Objective:    Physical Exam Alert and in no distress diabetic foot exam was normal..  Hemoglobin A1c is 6.6 Lab Review Diabetic Labs Latest Ref Rng & Units 03/15/2021 09/08/2020 03/29/2020 03/14/2020 09/14/2019  HbA1c 4.0 - 5.6 % 6.3(A) 6.5(A) - 6.8(A) 6.2(A)  Microalbumin mg/L - 7.7 - - 7.6  Micro/Creat Ratio - - 5.6 - - 7.5  Chol 100 - 199 mg/dL - 130 - - 130  HDL >39 mg/dL - 55 - - 54  Calc LDL 0 - 99 mg/dL - 61 - - 60  Triglycerides 0 - 149 mg/dL - 71 - - 85  Creatinine 0.57 - 1.00 mg/dL - 0.72 0.72 - 0.79   BP/Weight 03/15/2021 09/08/2020 08/29/2020 7/82/9562 10/25/863  Systolic BP 784 696 - 295 284  Diastolic BP 82 82 - 72 60  Wt. (Lbs) 219 215 216 216.8 212  BMI 38.79 38.09 38.26 38.4 37.55  Some encounter information is confidential and restricted. Go to Review Flowsheets activity to see all data.   Foot/eye exam completion dates Latest Ref Rng & Units 09/08/2020 12/17/2019  Eye Exam No Retinopathy - No Retinopathy  Foot Form Completion - Done -    Jarae  reports that she has never smoked. She has never used smokeless tobacco. She reports that she does  not drink alcohol and does not use drugs.     Assessment & Plan:    Controlled type 2 diabetes mellitus with complication, without long-term current use of insulin ([place]) - Plan: CBC with Differential/Platelet, Comprehensive metabolic panel, Lipid panel, POCT UA - Microalbumin  Need for influenza vaccination - Plan: Flu Vaccine QUAD High Dose(Fluad)  Need for COVID-19 vaccine - Plan: Pfizer Covid-19 Vaccine Bivalent Booster  Hypertension associated with diabetes ([place]) - Plan: POCT glycosylated hemoglobin (Hb A1C), CBC with Differential/Platelet, Comprehensive metabolic panel  Glaucoma, unspecified glaucoma type, unspecified laterality  Hyperlipidemia associated with type 2 diabetes mellitus ([place]) - Plan: Lipid panel  Rx changes: none Education: Reviewed 'ABCs' of diabetes management (respective goals in parentheses):  A1C (<7), blood pressure (<130/80), and cholesterol (LDL <100). Compliance at present is estimated to be fair. Efforts to improve compliance (if necessary) will be directed at increased exercise.  Recommend at least 20 minutes of something physical daily or 150 minutes a week of something.  Also recommend she cut back on carbohydrates. Follow up: 6 months I will refer her to [facility] to potentially get involved in a weight loss program with underlying diabetes

## 2021-09-18 ENCOUNTER — Other Ambulatory Visit: Payer: Self-pay

## 2021-09-18 ENCOUNTER — Ambulatory Visit (INDEPENDENT_AMBULATORY_CARE_PROVIDER_SITE_OTHER): Payer: Medicare Other | Admitting: Family Medicine

## 2021-09-18 ENCOUNTER — Encounter: Payer: Self-pay | Admitting: Family Medicine

## 2021-09-18 VITALS — BP 160/90 | HR 57 | Temp 97.7°F | Ht 63.0 in | Wt 223.2 lb

## 2021-09-18 DIAGNOSIS — E785 Hyperlipidemia, unspecified: Secondary | ICD-10-CM

## 2021-09-18 DIAGNOSIS — Z23 Encounter for immunization: Secondary | ICD-10-CM | POA: Diagnosis not present

## 2021-09-18 DIAGNOSIS — E118 Type 2 diabetes mellitus with unspecified complications: Secondary | ICD-10-CM

## 2021-09-18 DIAGNOSIS — H409 Unspecified glaucoma: Secondary | ICD-10-CM | POA: Diagnosis not present

## 2021-09-18 DIAGNOSIS — E1169 Type 2 diabetes mellitus with other specified complication: Secondary | ICD-10-CM | POA: Diagnosis not present

## 2021-09-18 DIAGNOSIS — I152 Hypertension secondary to endocrine disorders: Secondary | ICD-10-CM

## 2021-09-18 DIAGNOSIS — E1159 Type 2 diabetes mellitus with other circulatory complications: Secondary | ICD-10-CM | POA: Diagnosis not present

## 2021-09-18 LAB — POCT GLYCOSYLATED HEMOGLOBIN (HGB A1C): Hemoglobin A1C: 6.6 % — AB (ref 4.0–5.6)

## 2021-09-18 LAB — POCT UA - MICROALBUMIN
Albumin/Creatinine Ratio, Urine, POC: 12.2
Creatinine, POC: 132.9 mg/dL
Microalbumin Ur, POC: 16.2 mg/L

## 2021-09-18 NOTE — Patient Instructions (Signed)
Cut back on carbohydrates "white food" 20 minutes of something physical daily or 150 minutes week of something

## 2021-09-19 LAB — CBC WITH DIFFERENTIAL/PLATELET
Basophils Absolute: 0 10*3/uL (ref 0.0–0.2)
Basos: 1 %
EOS (ABSOLUTE): 0.1 10*3/uL (ref 0.0–0.4)
Eos: 3 %
Hematocrit: 35.4 % (ref 34.0–46.6)
Hemoglobin: 11.8 g/dL (ref 11.1–15.9)
Immature Grans (Abs): 0 10*3/uL (ref 0.0–0.1)
Immature Granulocytes: 0 %
Lymphocytes Absolute: 1 10*3/uL (ref 0.7–3.1)
Lymphs: 26 %
MCH: 30.6 pg (ref 26.6–33.0)
MCHC: 33.3 g/dL (ref 31.5–35.7)
MCV: 92 fL (ref 79–97)
Monocytes Absolute: 0.4 10*3/uL (ref 0.1–0.9)
Monocytes: 10 %
Neutrophils Absolute: 2.4 10*3/uL (ref 1.4–7.0)
Neutrophils: 60 %
Platelets: 211 10*3/uL (ref 150–450)
RBC: 3.85 x10E6/uL (ref 3.77–5.28)
RDW: 12.6 % (ref 11.7–15.4)
WBC: 3.9 10*3/uL (ref 3.4–10.8)

## 2021-09-19 LAB — COMPREHENSIVE METABOLIC PANEL
ALT: 19 IU/L (ref 0–32)
AST: 19 IU/L (ref 0–40)
Albumin/Globulin Ratio: 2.3 — ABNORMAL HIGH (ref 1.2–2.2)
Albumin: 4.4 g/dL (ref 3.7–4.7)
Alkaline Phosphatase: 77 IU/L (ref 44–121)
BUN/Creatinine Ratio: 33 — ABNORMAL HIGH (ref 12–28)
BUN: 22 mg/dL (ref 8–27)
Bilirubin Total: 0.2 mg/dL (ref 0.0–1.2)
CO2: 26 mmol/L (ref 20–29)
Calcium: 9.7 mg/dL (ref 8.7–10.3)
Chloride: 105 mmol/L (ref 96–106)
Creatinine, Ser: 0.67 mg/dL (ref 0.57–1.00)
Globulin, Total: 1.9 g/dL (ref 1.5–4.5)
Glucose: 150 mg/dL — ABNORMAL HIGH (ref 70–99)
Potassium: 4.9 mmol/L (ref 3.5–5.2)
Sodium: 145 mmol/L — ABNORMAL HIGH (ref 134–144)
Total Protein: 6.3 g/dL (ref 6.0–8.5)
eGFR: 93 mL/min/{1.73_m2} (ref >59)

## 2021-09-19 LAB — LIPID PANEL
Chol/HDL Ratio: 2.7 ratio (ref 0.0–4.4)
Cholesterol, Total: 142 mg/dL (ref 100–199)
HDL: 53 mg/dL (ref >39)
LDL Chol Calc (NIH): 72 mg/dL (ref 0–99)
Triglycerides: 87 mg/dL (ref 0–149)
VLDL Cholesterol Cal: 17 mg/dL (ref 5–40)

## 2021-09-20 ENCOUNTER — Other Ambulatory Visit: Payer: Self-pay | Admitting: Family Medicine

## 2021-09-25 DIAGNOSIS — Z1231 Encounter for screening mammogram for malignant neoplasm of breast: Secondary | ICD-10-CM | POA: Diagnosis not present

## 2021-09-25 LAB — HM MAMMOGRAPHY

## 2021-10-20 DIAGNOSIS — E119 Type 2 diabetes mellitus without complications: Secondary | ICD-10-CM | POA: Diagnosis not present

## 2021-10-20 DIAGNOSIS — H26493 Other secondary cataract, bilateral: Secondary | ICD-10-CM | POA: Diagnosis not present

## 2021-10-20 DIAGNOSIS — H43811 Vitreous degeneration, right eye: Secondary | ICD-10-CM | POA: Diagnosis not present

## 2021-10-20 DIAGNOSIS — H35372 Puckering of macula, left eye: Secondary | ICD-10-CM | POA: Diagnosis not present

## 2021-10-20 DIAGNOSIS — H40033 Anatomical narrow angle, bilateral: Secondary | ICD-10-CM | POA: Diagnosis not present

## 2021-10-20 DIAGNOSIS — Z961 Presence of intraocular lens: Secondary | ICD-10-CM | POA: Diagnosis not present

## 2021-10-20 LAB — HM DIABETES EYE EXAM

## 2021-11-07 LAB — HM HEPATITIS C SCREENING LAB: HM Hepatitis Screen: NEGATIVE

## 2021-11-09 ENCOUNTER — Other Ambulatory Visit: Payer: Self-pay | Admitting: Family Medicine

## 2021-11-09 DIAGNOSIS — E1159 Type 2 diabetes mellitus with other circulatory complications: Secondary | ICD-10-CM

## 2021-11-21 DIAGNOSIS — L821 Other seborrheic keratosis: Secondary | ICD-10-CM | POA: Diagnosis not present

## 2021-11-21 DIAGNOSIS — L570 Actinic keratosis: Secondary | ICD-10-CM | POA: Diagnosis not present

## 2021-11-21 DIAGNOSIS — D225 Melanocytic nevi of trunk: Secondary | ICD-10-CM | POA: Diagnosis not present

## 2021-11-21 DIAGNOSIS — L814 Other melanin hyperpigmentation: Secondary | ICD-10-CM | POA: Diagnosis not present

## 2021-11-21 DIAGNOSIS — Z08 Encounter for follow-up examination after completed treatment for malignant neoplasm: Secondary | ICD-10-CM | POA: Diagnosis not present

## 2021-11-21 DIAGNOSIS — Z85828 Personal history of other malignant neoplasm of skin: Secondary | ICD-10-CM | POA: Diagnosis not present

## 2021-11-30 ENCOUNTER — Other Ambulatory Visit: Payer: Self-pay | Admitting: Family Medicine

## 2021-11-30 DIAGNOSIS — E118 Type 2 diabetes mellitus with unspecified complications: Secondary | ICD-10-CM

## 2021-11-30 DIAGNOSIS — E785 Hyperlipidemia, unspecified: Secondary | ICD-10-CM

## 2021-11-30 DIAGNOSIS — E1169 Type 2 diabetes mellitus with other specified complication: Secondary | ICD-10-CM

## 2021-12-20 ENCOUNTER — Other Ambulatory Visit: Payer: Self-pay

## 2021-12-20 ENCOUNTER — Encounter (INDEPENDENT_AMBULATORY_CARE_PROVIDER_SITE_OTHER): Payer: Medicare Other | Admitting: Ophthalmology

## 2021-12-20 DIAGNOSIS — H35033 Hypertensive retinopathy, bilateral: Secondary | ICD-10-CM | POA: Diagnosis not present

## 2021-12-20 DIAGNOSIS — H353132 Nonexudative age-related macular degeneration, bilateral, intermediate dry stage: Secondary | ICD-10-CM | POA: Diagnosis not present

## 2021-12-20 DIAGNOSIS — H35372 Puckering of macula, left eye: Secondary | ICD-10-CM

## 2021-12-20 DIAGNOSIS — I10 Essential (primary) hypertension: Secondary | ICD-10-CM

## 2021-12-20 DIAGNOSIS — H43813 Vitreous degeneration, bilateral: Secondary | ICD-10-CM

## 2022-02-07 ENCOUNTER — Other Ambulatory Visit: Payer: Self-pay | Admitting: Family Medicine

## 2022-02-07 ENCOUNTER — Ambulatory Visit
Admission: RE | Admit: 2022-02-07 | Discharge: 2022-02-07 | Disposition: A | Discharge status: Home / Self Care | Payer: Medicare Other | Source: Ambulatory Visit | Attending: Family Medicine | Admitting: Family Medicine

## 2022-02-07 ENCOUNTER — Ambulatory Visit (INDEPENDENT_AMBULATORY_CARE_PROVIDER_SITE_OTHER): Payer: Medicare Other | Admitting: Family Medicine

## 2022-02-07 ENCOUNTER — Encounter: Payer: Self-pay | Admitting: Family Medicine

## 2022-02-07 VITALS — BP 130/60 | HR 60 | Ht 62.5 in | Wt 223.8 lb

## 2022-02-07 DIAGNOSIS — M25551 Pain in right hip: Secondary | ICD-10-CM | POA: Diagnosis not present

## 2022-02-07 DIAGNOSIS — E118 Type 2 diabetes mellitus with unspecified complications: Secondary | ICD-10-CM

## 2022-02-07 NOTE — Progress Notes (Signed)
? ?  Subjective:  ? ? Patient ID: [name], female    DOB: 03-Aug-1951, 71 y.o.   MRN: [id number] ? ?HPI ?She complains of a 2-week history of right hip pain.  No history of injury or previous difficulty with her hip. ? ? ?Review of Systems ? ?   ?Objective:  ? Physical Exam ?Limitation of motion is noted with internal rotation but normal external rotation.  Slight discomfort with manipulation. ? ? ? ?   ?Assessment & Plan:  ?Right hip pain - Plan: DG HIP UNILAT WITH PELVIS 2-3 VIEWS RIGHT ? ? ?

## 2022-03-08 ENCOUNTER — Other Ambulatory Visit: Payer: Self-pay | Admitting: Family Medicine

## 2022-03-08 DIAGNOSIS — E1169 Type 2 diabetes mellitus with other specified complication: Secondary | ICD-10-CM

## 2022-03-16 ENCOUNTER — Ambulatory Visit (INDEPENDENT_AMBULATORY_CARE_PROVIDER_SITE_OTHER): Payer: Medicare Other

## 2022-03-16 VITALS — Ht 64.0 in | Wt 220.0 lb

## 2022-03-16 DIAGNOSIS — Z0000 Encounter for general adult medical examination without abnormal findings: Secondary | ICD-10-CM | POA: Diagnosis not present

## 2022-03-16 NOTE — Patient Instructions (Signed)
[name] , Thank you for taking time to come for your Medicare Wellness Visit. I appreciate your ongoing commitment to your health goals. Please review the following plan we discussed and let me know if I can assist you in the future.   Screening recommendations/referrals: Colonoscopy: completed 12/11/2019, due 12/10/2029 Mammogram: completed 09/25/2021, due 09/26/2022 Bone Density: completed 04/11/2021 Recommended yearly ophthalmology/optometry visit for glaucoma screening and checkup Recommended yearly dental visit for hygiene and checkup  Vaccinations: Influenza vaccine: due 05/22/2022 Pneumococcal vaccine: completed 05/01/2017 Tdap vaccine: completed 05/01/2017, due 05/02/2027 Shingles vaccine: completed   Covid-19: 09/18/2021, 03/15/2021, 08/20/2020, 01/09/2020, 12/19/2019  Advanced directives: copy in chart  Conditions/risks identified: none  Next appointment: Follow up in one year for your annual wellness visit    Preventive Care 65 Years and Older, Female Preventive care refers to lifestyle choices and visits with your health care provider that can promote health and wellness. What does preventive care include? A yearly physical exam. This is also called an annual well check. Dental exams once or twice a year. Routine eye exams. Ask your health care provider how often you should have your eyes checked. Personal lifestyle choices, including: Daily care of your teeth and gums. Regular physical activity. Eating a healthy diet. Avoiding tobacco and drug use. Limiting alcohol use. Practicing safe sex. Taking low-dose aspirin every day. Taking vitamin and mineral supplements as recommended by your health care provider. What happens during an annual well check? The services and screenings done by your health care provider during your annual well check will depend on your age, overall health, lifestyle risk factors, and family history of disease. Counseling  Your health care provider  may ask you questions about your: Alcohol use. Tobacco use. Drug use. Emotional well-being. Home and relationship well-being. Sexual activity. Eating habits. History of falls. Memory and ability to understand (cognition). Work and work Statistician. Reproductive health. Screening  You may have the following tests or measurements: Height, weight, and BMI. Blood pressure. Lipid and cholesterol levels. These may be checked every 5 years, or more frequently if you are over 44 years old. Skin check. Lung cancer screening. You may have this screening every year starting at age 67 if you have a 30-pack-year history of smoking and currently smoke or have quit within the past 15 years. Fecal occult blood test (FOBT) of the stool. You may have this test every year starting at age 60. Flexible sigmoidoscopy or colonoscopy. You may have a sigmoidoscopy every 5 years or a colonoscopy every 10 years starting at age 34. Hepatitis C blood test. Hepatitis B blood test. Sexually transmitted disease (STD) testing. Diabetes screening. This is done by checking your blood sugar (glucose) after you have not eaten for a while (fasting). You may have this done every 1-3 years. Bone density scan. This is done to screen for osteoporosis. You may have this done starting at age 9. Mammogram. This may be done every 1-2 years. Talk to your health care provider about how often you should have regular mammograms. Talk with your health care provider about your test results, treatment options, and if necessary, the need for more tests. Vaccines  Your health care provider may recommend certain vaccines, such as: Influenza vaccine. This is recommended every year. Tetanus, diphtheria, and acellular pertussis (Tdap, Td) vaccine. You may need a Td booster every 10 years. Zoster vaccine. You may need this after age 38. Pneumococcal 13-valent conjugate (PCV13) vaccine. One dose is recommended after age 57. Pneumococcal  polysaccharide (PPSV23)  vaccine. One dose is recommended after age 28. Talk to your health care provider about which screenings and vaccines you need and how often you need them. This information is not intended to replace advice given to you by your health care provider. Make sure you discuss any questions you have with your health care provider. Document Released: 11/04/2015 Document Revised: 06/27/2016 Document Reviewed: 08/09/2015 Elsevier Interactive Patient Education  2017 [place] Prevention in the Home Falls can cause injuries. They can happen to people of all ages. There are many things you can do to make your home safe and to help prevent falls. What can I do on the outside of my home? Regularly fix the edges of walkways and driveways and fix any cracks. Remove anything that might make you trip as you walk through a door, such as a raised step or threshold. Trim any bushes or trees on the path to your home. Use bright outdoor lighting. Clear any walking paths of anything that might make someone trip, such as rocks or tools. Regularly check to see if handrails are loose or broken. Make sure that both sides of any steps have handrails. Any raised decks and porches should have guardrails on the edges. Have any leaves, snow, or ice cleared regularly. Use sand or salt on walking paths during winter. Clean up any spills in your garage right away. This includes oil or grease spills. What can I do in the bathroom? Use night lights. Install grab bars by the toilet and in the tub and shower. Do not use towel bars as grab bars. Use non-skid mats or decals in the tub or shower. If you need to sit down in the shower, use a plastic, non-slip stool. Keep the floor dry. Clean up any water that spills on the floor as soon as it happens. Remove soap buildup in the tub or shower regularly. Attach bath mats securely with double-sided non-slip rug tape. Do not have throw rugs and other  things on the floor that can make you trip. What can I do in the bedroom? Use night lights. Make sure that you have a light by your bed that is easy to reach. Do not use any sheets or blankets that are too big for your bed. They should not hang down onto the floor. Have a firm chair that has side arms. You can use this for support while you get dressed. Do not have throw rugs and other things on the floor that can make you trip. What can I do in the kitchen? Clean up any spills right away. Avoid walking on wet floors. Keep items that you use a lot in easy-to-reach places. If you need to reach something above you, use a strong step stool that has a grab bar. Keep electrical cords out of the way. Do not use floor polish or wax that makes floors slippery. If you must use wax, use non-skid floor wax. Do not have throw rugs and other things on the floor that can make you trip. What can I do with my stairs? Do not leave any items on the stairs. Make sure that there are handrails on both sides of the stairs and use them. Fix handrails that are broken or loose. Make sure that handrails are as long as the stairways. Check any carpeting to make sure that it is firmly attached to the stairs. Fix any carpet that is loose or worn. Avoid having throw rugs at the top or bottom of  the stairs. If you do have throw rugs, attach them to the floor with carpet tape. Make sure that you have a light switch at the top of the stairs and the bottom of the stairs. If you do not have them, ask someone to add them for you. What else can I do to help prevent falls? Wear shoes that: Do not have high heels. Have rubber bottoms. Are comfortable and fit you well. Are closed at the toe. Do not wear sandals. If you use a stepladder: Make sure that it is fully opened. Do not climb a closed stepladder. Make sure that both sides of the stepladder are locked into place. Ask someone to hold it for you, if possible. Clearly  mark and make sure that you can see: Any grab bars or handrails. First and last steps. Where the edge of each step is. Use tools that help you move around (mobility aids) if they are needed. These include: Canes. Walkers. Scooters. Crutches. Turn on the lights when you go into a dark area. Replace any light bulbs as soon as they burn out. Set up your furniture so you have a clear path. Avoid moving your furniture around. If any of your floors are uneven, fix them. If there are any pets around you, be aware of where they are. Review your medicines with your doctor. Some medicines can make you feel dizzy. This can increase your chance of falling. Ask your doctor what other things that you can do to help prevent falls. This information is not intended to replace advice given to you by your health care provider. Make sure you discuss any questions you have with your health care provider. Document Released: 08/04/2009 Document Revised: 03/15/2016 Document Reviewed: 11/12/2014 Elsevier Interactive Patient Education  2017 [employer].

## 2022-03-16 NOTE — Progress Notes (Signed)
I connected with [name] today by telephone and verified that I am speaking with the correct person using two identifiers. Location patient: home Location provider: work Persons participating in the virtual visit: Ofilia, Rayon LPN.   I discussed the limitations, risks, security and privacy concerns of performing an evaluation and management service by telephone and the availability of in person appointments. I also discussed with the patient that there may be a patient responsible charge related to this service. The patient expressed understanding and verbally consented to this telephonic visit.    Interactive audio and video telecommunications were attempted between this provider and patient, however failed, due to patient having technical difficulties OR patient did not have access to video capability.  We continued and completed visit with audio only.     Vital signs may be patient reported or missing.  Subjective:   [name] is a 71 y.o. female who presents for Medicare Annual (Subsequent) preventive examination.  Review of Systems     Cardiac Risk Factors include: advanced age (>70mn, >>66women);diabetes mellitus;hypertension;obesity (BMI >30kg/m2)     Objective:    Today's Vitals   03/16/22 0956  Weight: 220 lb (99.8 kg)  Height: '5\' 4"'$  (1.626 m)   Body mass index is 37.76 kg/m.     03/16/2022   10:02 AM 03/15/2021    9:33 AM 03/14/2020   10:12 AM 12/30/2018    8:53 AM 05/14/2016    9:32 AM 06/30/2014    9:32 AM 10/12/2012    1:09 AM  Advanced Directives  Does Patient Have a Medical Advance Directive? Yes Yes Yes No Yes Yes Patient has advance directive, copy not in chart  Type of Advance Directive HFlaglerLiving will Living will HNapavineLiving will  HJakinLiving will Living will Living will  Does patient want to make changes to medical advance directive?  No - Patient declined No  - Patient declined   Yes - information given   Copy of H[place]arroin Chart? Yes - validated most recent copy scanned in chart (See row information)  No - copy requested  No - copy requested No - copy requested Copy requested from family  Would patient like information on creating a medical advance directive?    Yes (MAU/Ambulatory/Procedural Areas - Information given)       Current Medications (verified) Outpatient Encounter Medications as of 03/16/2022  Medication Sig   aspirin EC 81 MG tablet Take 81 mg by mouth daily.   atorvastatin (LIPITOR) 10 MG tablet TAKE 1 TABLET(10 MG) BY MOUTH DAILY   calcium carbonate (OS-CAL) 600 MG TABS Take 600 mg by mouth 2 (two) times daily with a meal.   Cholecalciferol (VITAMIN D) 2000 UNITS tablet Take 2,000 Units by mouth daily.   Lancets (ONETOUCH ULTRASOFT) lancets USE AS DIRECTED TO TEST ONCE DAILY.   lisinopril-hydrochlorothiazide (ZESTORETIC) 20-12.5 MG tablet TAKE 1 TABLET BY MOUTH DAILY   MegaRed Omega-3 Krill Oil 500 MG CAPS Take 2 capsules by mouth daily.   metFORMIN (GLUCOPHAGE) 500 MG tablet TAKE 1 TABLET(500 MG) BY MOUTH TWICE DAILY WITH A MEAL   Misc Natural Products (COSAMIN ASU ADVANCED FORMULA) CAPS Take 2 each by mouth 2 (two) times daily.   Multiple Vitamins-Minerals (MULTIVITAMIN WITH MINERALS) tablet Take 1 tablet by mouth daily.   Multiple Vitamins-Minerals (PRESERVISION AREDS 2+MULTI VIT PO) Take by mouth.   ONETOUCH ULTRA test strip TEST ONCE DAILY AS DIRECTED  pioglitazone (ACTOS) 30 MG tablet TAKE 1 TABLET(30 MG) BY MOUTH DAILY   acetaminophen (TYLENOL) 325 MG tablet Take 325-650 mg by mouth every 6 (six) hours as needed. Reported on 04/26/2016 (Patient not taking: Reported on 02/07/2022)   betamethasone valerate lotion (VALISONE) 0.1 % Apply 1 application topically 2 (two) times daily as needed. (Patient not taking: Reported on 09/18/2021)   clobetasol (TEMOVATE) 0.05 % external solution Uses as needed (Patient not  taking: Reported on 09/18/2021)   loratadine (CLARITIN) 10 MG tablet Take 10 mg by mouth daily as needed for allergies. (Patient not taking: Reported on 02/07/2022)   mometasone (ELOCON) 0.1 % cream APPLY TO EXTERNAL EAR DAILY AS NEEDED FOR ITCHING OF DRY SKIN (Patient not taking: Reported on 09/18/2021)   mupirocin ointment (BACTROBAN) 2 % Place 1 application into the nose 2 (two) times daily. (Patient not taking: Reported on 09/18/2021)   No facility-administered encounter medications on file as of 03/16/2022.    Allergies (verified) Patient has no known allergies.   History: Past Medical History:  Diagnosis Date   Basal cell carcinoma of nose 11/30/13   [facility]    Diabetes mellitus    Hyperlipidemia    Hypertension    Obesity    Past Surgical History:  Procedure Laterality Date   ABDOMINAL HYSTERECTOMY     fractured patella repair  02/2011   Dr. Theda Sers; R knee   ORIF TIBIA FRACTURE     ORIF TIBIA PLATEAU  10/12/2012   Procedure: OPEN REDUCTION INTERNAL FIXATION (ORIF) TIBIAL PLATEAU;  Surgeon: Rozanna Box, MD;  Location: [facility];  Service: Orthopedics;  Laterality: Right;   ORIF WRIST FRACTURE  11/03/2012   Procedure: OPEN REDUCTION INTERNAL FIXATION (ORIF) WRIST FRACTURE;  Surgeon: Rozanna Box, MD;  Location: [facility];  Service: Orthopedics;  Laterality: Left;   TOTAL ABDOMINAL HYSTERECTOMY W/ BILATERAL SALPINGOOPHORECTOMY     fibroids   Family History  Problem Relation Age of Onset   Asthma Other    Hypertension Other    Diabetes Other    Stroke Father    Diabetes Father    Social History   Socioeconomic History   Marital status: Married    Spouse name: Not on file   Number of children: 2   Years of education: Not on file   Highest education level: Not on file  Occupational History   Occupation: shipping    Employer: [employer]  Tobacco Use   Smoking status: Never   Smokeless tobacco: Never  Vaping Use   Vaping Use: Never used   Substance and Sexual Activity   Alcohol use: No   Drug use: No   Sexual activity: Yes    Birth control/protection: Surgical    Comment: hysterectomy  Other Topics Concern   Not on file  Social History Narrative   Lives with [place].  Two children and two grands.     Social Determinants of Health   Financial Resource Strain: Low Risk    Difficulty of Paying Living Expenses: Not hard at all  Food Insecurity: No Food Insecurity   Worried About Charity fundraiser in the Last Year: Never true   [place] in the Last Year: Never true  Transportation Needs: No Transportation Needs   Lack of Transportation (Medical): No   Lack of Transportation (Non-Medical): No  Physical Activity: Inactive   Days of Exercise per Week: 0 days   Minutes of Exercise per Session: 0 min  Stress:  No Stress Concern Present   Feeling of Stress : Not at all  Social Connections: Not on file    Tobacco Counseling Counseling given: Not Answered   Clinical Intake:  Pre-visit preparation completed: Yes  Pain : No/denies pain     Nutritional Status: BMI > 30  Obese Nutritional Risks: None Diabetes: Yes  How often do you need to have someone help you when you read instructions, pamphlets, or other written materials from your doctor or pharmacy?: 1 - Never What is the last grade level you completed in school?: 12th grade  Diabetic? Yes Nutrition Risk Assessment:  Has the patient had any N/V/D within the last 2 months?  No  Does the patient have any non-healing wounds?  No  Has the patient had any unintentional weight loss or weight gain?  No   Diabetes:  Is the patient diabetic?  Yes  If diabetic, was a CBG obtained today?  No  Did the patient bring in their glucometer from home?  No  How often do you monitor your CBG's? daily.   Financial Strains and Diabetes Management:  Are you having any financial strains with the device, your supplies or your medication? No .  Does the patient  want to be seen by Chronic Care Management for management of their diabetes?  No  Would the patient like to be referred to a Nutritionist or for Diabetic Management?  No   Diabetic Exams:  Diabetic Eye Exam: Completed 10/20/2021 Diabetic Foot Exam: Completed 09/18/2021   Interpreter Needed?: No  Information entered by :: NAllen LPN   Activities of Daily Living    03/16/2022   10:04 AM  In your present state of health, do you have any difficulty performing the following activities:  Hearing? 0  Vision? 0  Difficulty concentrating or making decisions? 0  Walking or climbing stairs? 1  Dressing or bathing? 0  Doing errands, shopping? 0  Preparing Food and eating ? N  Using the Toilet? N  In the past six months, have you accidently leaked urine? N  Do you have problems with loss of bowel control? N  Managing your Medications? N  Managing your Finances? N  Housekeeping or managing your Housekeeping? N    Patient Care Team: Denita Lung, MD as PCP - General (Family Medicine)  Indicate any recent Medical Services you may have received from other than [facility] providers in the past year (date may be approximate).     Assessment:   This is a routine wellness examination for [facility].  Hearing/Vision screen Vision Screening - Comments:: Regular eye exams, Dr. Zigmund Daniel,   Dietary issues and exercise activities discussed: Current Exercise Habits: The patient does not participate in regular exercise at present   Goals Addressed             This Visit's Progress    Patient Stated       03/16/2022, wants to be more active       Depression Screen    03/16/2022   10:03 AM 03/15/2021    9:36 AM 03/14/2020    9:46 AM 12/30/2018    8:30 AM 05/12/2018    9:12 AM 12/26/2016    8:23 AM 09/13/2015    8:10 AM  PHQ 2/9 Scores  PHQ - 2 Score 0 0 0 0 0 0 0  PHQ- 9 Score 0          Fall Risk    03/16/2022   10:03 AM 03/15/2021  9:33 AM 03/14/2020    9:46 AM 12/30/2018    8:30  AM 05/22/2018    1:04 PM  Fall Risk   Falls in the past year? 0 0 0 0 No  Comment     Emmi Telephone Survey: data to providers prior to load  Number falls in past yr: 0 0 0    Injury with Fall? 0 0     Risk for fall due to : Medication side effect No Fall Risks     Follow up Falls evaluation completed;Education provided;Falls prevention discussed        FALL RISK PREVENTION PERTAINING TO THE HOME:  Any stairs in or around the home? No  If so, are there any without handrails?  N/a Home free of loose throw rugs in walkways, pet beds, electrical cords, etc? Yes  Adequate lighting in your home to reduce risk of falls? Yes   ASSISTIVE DEVICES UTILIZED TO PREVENT FALLS:  Life alert? No  Use of a cane, walker or w/c? No  Grab bars in the bathroom? Yes  Shower chair or bench in shower? No  Elevated toilet seat or a handicapped toilet? Yes   TIMED UP AND GO:  Was the test performed? No .      Cognitive Function:        03/16/2022   10:06 AM  6CIT Screen  What Year? 0 points  What month? 0 points  What time? 0 points  Count back from 20 0 points  Months in reverse 0 points  Repeat phrase 0 points  Total Score 0 points    Immunizations Immunization History  Administered Date(s) Administered   Fluad Quad(high Dose 65+) 09/14/2019, 09/08/2020, 09/18/2021   Influenza Split 06/30/2012, 08/02/2013   Influenza Whole 06/27/2011   Influenza, High Dose Seasonal PF 09/13/2015, 08/28/2016, 07/01/2017, 09/11/2018   Influenza,inj,Quad PF,6+ Mos 06/10/2014   PFIZER Comirnaty(Gray Top)Covid-19 Tri-Sucrose Vaccine 03/15/2021   PFIZER(Purple Top)SARS-COV-2 Vaccination 12/19/2019, 01/09/2020, 08/20/2020   Pfizer Covid-19 Vaccine Bivalent Booster 85yr & up 09/18/2021   Pneumococcal Conjugate-13 09/07/2014, 05/01/2017   Pneumococcal Polysaccharide-23 02/28/2012   Tdap 01/23/2007, 05/01/2017   Zoster Recombinat (Shingrix) 08/01/2017, 12/19/2017   Zoster, Live 01/23/2007    TDAP  status: Up to date  Flu Vaccine status: Up to date  Pneumococcal vaccine status: Up to date  Covid-19 vaccine status: Completed vaccines  Qualifies for Shingles Vaccine? Yes   Zostavax completed Yes   Shingrix Completed?: Yes  Screening Tests Health Maintenance  Topic Date Due   HEMOGLOBIN A1C  03/18/2022   INFLUENZA VACCINE  05/22/2022   FOOT EXAM  09/18/2022   OPHTHALMOLOGY EXAM  10/20/2022   MAMMOGRAM  09/26/2023   TETANUS/TDAP  05/02/2027   COLONOSCOPY (Pts 45-465yrInsurance coverage will need to be confirmed)  12/10/2029   DEXA SCAN  Completed   COVID-19 Vaccine  Completed   Hepatitis C Screening  Completed   Zoster Vaccines- Shingrix  Completed   HPV VACCINES  Aged Out   Pneumonia Vaccine 6563Years old  Discontinued    Health Maintenance  There are no preventive care reminders to display for this patient.  Colorectal cancer screening: Type of screening: Colonoscopy. Completed 12/11/2019. Repeat every 10 years  Mammogram status: Completed 09/25/2021. Repeat every year  Bone Density status: Completed 04/11/2021.   Lung Cancer Screening: (Low Dose CT Chest recommended if Age 72-80ears, 30 pack-year currently smoking OR have quit w/in 15years.) does not qualify.   Lung Cancer Screening Referral: no  Additional Screening:  Hepatitis C Screening: does qualify; Completed 11/07/2021  Vision Screening: Recommended annual ophthalmology exams for early detection of glaucoma and other disorders of the eye. Is the patient up to date with their annual eye exam?  Yes  Who is the provider or what is the name of the office in which the patient attends annual eye exams? Dr. Zigmund Daniel If pt is not established with a provider, would they like to be referred to a provider to establish care? No .   Dental Screening: Recommended annual dental exams for proper oral hygiene  Community Resource Referral / Chronic Care Management: CRR required this visit?  No   CCM required this  visit?  No      Plan:     I have personally reviewed and noted the following in the patient's chart:   Medical and social history Use of alcohol, tobacco or illicit drugs  Current medications and supplements including opioid prescriptions.  Functional ability and status Nutritional status Physical activity Advanced directives List of other physicians Hospitalizations, surgeries, and ER visits in previous 12 months Vitals Screenings to include cognitive, depression, and falls Referrals and appointments  In addition, I have reviewed and discussed with patient certain preventive protocols, quality metrics, and best practice recommendations. A written personalized care plan for preventive services as well as general preventive health recommendations were provided to patient.     Kellie Simmering, LPN   02/06/4080   Nurse Notes: none  Due to this being a virtual visit, the after visit summary with patients personalized plan was offered to patient via mail or my-chart. Patient would like to access on my-chart

## 2022-03-30 ENCOUNTER — Ambulatory Visit (INDEPENDENT_AMBULATORY_CARE_PROVIDER_SITE_OTHER): Payer: Medicare Other | Admitting: Family Medicine

## 2022-03-30 ENCOUNTER — Encounter: Payer: Self-pay | Admitting: Family Medicine

## 2022-03-30 VITALS — BP 130/72 | HR 58 | Temp 97.2°F | Ht 62.5 in | Wt 221.2 lb

## 2022-03-30 DIAGNOSIS — E1169 Type 2 diabetes mellitus with other specified complication: Secondary | ICD-10-CM

## 2022-03-30 DIAGNOSIS — Z9849 Cataract extraction status, unspecified eye: Secondary | ICD-10-CM | POA: Diagnosis not present

## 2022-03-30 DIAGNOSIS — Z0000 Encounter for general adult medical examination without abnormal findings: Secondary | ICD-10-CM | POA: Diagnosis not present

## 2022-03-30 DIAGNOSIS — H409 Unspecified glaucoma: Secondary | ICD-10-CM

## 2022-03-30 DIAGNOSIS — E118 Type 2 diabetes mellitus with unspecified complications: Secondary | ICD-10-CM | POA: Diagnosis not present

## 2022-03-30 DIAGNOSIS — M8580 Other specified disorders of bone density and structure, unspecified site: Secondary | ICD-10-CM

## 2022-03-30 DIAGNOSIS — F32A Depression, unspecified: Secondary | ICD-10-CM

## 2022-03-30 DIAGNOSIS — I152 Hypertension secondary to endocrine disorders: Secondary | ICD-10-CM | POA: Diagnosis not present

## 2022-03-30 DIAGNOSIS — E785 Hyperlipidemia, unspecified: Secondary | ICD-10-CM

## 2022-03-30 DIAGNOSIS — F419 Anxiety disorder, unspecified: Secondary | ICD-10-CM

## 2022-03-30 DIAGNOSIS — E1159 Type 2 diabetes mellitus with other circulatory complications: Secondary | ICD-10-CM | POA: Diagnosis not present

## 2022-03-30 LAB — POCT GLYCOSYLATED HEMOGLOBIN (HGB A1C): Hemoglobin A1C: 7 % — AB (ref 4.0–5.6)

## 2022-03-30 NOTE — Progress Notes (Signed)
Complete physical exam  Patient: [name]   DOB: 11-26-1950   71 y.o. Female  MRN: [id number]  Subjective:     Marland Kitchen [name] is a 71 y.o. female who presents today for a complete physical exam. She reports consuming a diabetic, 2000 calorie diet.  Staying active at least 20 min a day  She generally feels well. She reports sleeping fairly well. She does not have additional problems to discuss today.  She continues on pioglitazone and metformin for her diabetes and having no difficulty with that.  Lisinopril and HCTZ is being taken for her hypertension and she is doing well with that.  She has no difficulty with atorvastatin.  She is on over-the-counter medications on other issues.  Allergies seem to be under good control.  She has had cataracts removed.  She no longer has any difficulty with anxiety and depression.  She is being followed regularly by ophthalmology for her glaucoma.  Her last colonoscopy was negative.  She is on vitamin D and calcium for treatment of the osteopenia.  Her marriage is going well.   Most recent fall risk assessment:    03/16/2022   10:03 AM  Fall Risk   Falls in the past year? 0  Number falls in past yr: 0  Injury with Fall? 0  Risk for fall due to : Medication side effect  Follow up Falls evaluation completed;Education provided;Falls prevention discussed     Most recent depression screenings:    03/16/2022   10:03 AM 03/15/2021    9:36 AM  PHQ 2/9 Scores  PHQ - 2 Score 0 0  PHQ- 9 Score 0         Patient Care Team: Denita Lung, MD as PCP - General (Family Medicine)   Outpatient Medications Prior to Visit  Medication Sig   acetaminophen (TYLENOL) 325 MG tablet Take 325-650 mg by mouth every 6 (six) hours as needed. Reported on 04/26/2016 (Patient not taking: Reported on 02/07/2022)   aspirin EC 81 MG tablet Take 81 mg by mouth daily.   atorvastatin (LIPITOR) 10 MG tablet TAKE 1 TABLET(10 MG) BY MOUTH DAILY   betamethasone valerate  lotion (VALISONE) 0.1 % Apply 1 application topically 2 (two) times daily as needed. (Patient not taking: Reported on 09/18/2021)   calcium carbonate (OS-CAL) 600 MG TABS Take 600 mg by mouth 2 (two) times daily with a meal.   Cholecalciferol (VITAMIN D) 2000 UNITS tablet Take 2,000 Units by mouth daily.   clobetasol (TEMOVATE) 0.05 % external solution Uses as needed (Patient not taking: Reported on 09/18/2021)   Lancets (ONETOUCH ULTRASOFT) lancets USE AS DIRECTED TO TEST ONCE DAILY.   lisinopril-hydrochlorothiazide (ZESTORETIC) 20-12.5 MG tablet TAKE 1 TABLET BY MOUTH DAILY   loratadine (CLARITIN) 10 MG tablet Take 10 mg by mouth daily as needed for allergies. (Patient not taking: Reported on 02/07/2022)   MegaRed Omega-3 Krill Oil 500 MG CAPS Take 2 capsules by mouth daily.   metFORMIN (GLUCOPHAGE) 500 MG tablet TAKE 1 TABLET(500 MG) BY MOUTH TWICE DAILY WITH A MEAL   Misc Natural Products (COSAMIN ASU ADVANCED FORMULA) CAPS Take 2 each by mouth 2 (two) times daily.   mometasone (ELOCON) 0.1 % cream APPLY TO EXTERNAL EAR DAILY AS NEEDED FOR ITCHING OF DRY SKIN (Patient not taking: Reported on 09/18/2021)   Multiple Vitamins-Minerals (MULTIVITAMIN WITH MINERALS) tablet Take 1 tablet by mouth daily.   Multiple Vitamins-Minerals (PRESERVISION AREDS 2+MULTI VIT PO) Take by mouth.  mupirocin ointment (BACTROBAN) 2 % Place 1 application into the nose 2 (two) times daily. (Patient not taking: Reported on 09/18/2021)   ONETOUCH ULTRA test strip TEST ONCE DAILY AS DIRECTED   pioglitazone (ACTOS) 30 MG tablet TAKE 1 TABLET(30 MG) BY MOUTH DAILY   No facility-administered medications prior to visit.    Review of Systems  All other systems reviewed and are negative.         Objective:  Alert and in no distress. Tympanic membranes and canals are normal. Pharyngeal area is normal. Neck is supple without adenopathy or thyromegaly. Cardiac exam shows a regular sinus rhythm without murmurs or gallops.  Lungs are clear to auscultation. Diabetic foot exam is totally normal Hemoglobin A1c is 7.0  BP Readings from Last 3 Encounters:  02/07/22 130/60  09/18/21 (!) 160/90  03/15/21 138/82        Last CBC Lab Results  Component Value Date   WBC 3.9 09/18/2021   HGB 11.8 09/18/2021   HCT 35.4 09/18/2021   MCV 92 09/18/2021   MCH 30.6 09/18/2021   RDW 12.6 09/18/2021   PLT 211 37/85/8850   Last metabolic panel Lab Results  Component Value Date   GLUCOSE 150 (H) 09/18/2021   NA 145 (H) 09/18/2021   K 4.9 09/18/2021   CL 105 09/18/2021   CO2 26 09/18/2021   BUN 22 09/18/2021   CREATININE 0.67 09/18/2021   EGFR 93 09/18/2021   CALCIUM 9.7 09/18/2021   PHOS 3.3 04/09/2011   PROT 6.3 09/18/2021   ALBUMIN 4.4 09/18/2021   LABGLOB 1.9 09/18/2021   AGRATIO 2.3 (H) 09/18/2021   BILITOT 0.2 09/18/2021   ALKPHOS 77 09/18/2021   AST 19 09/18/2021   ALT 19 09/18/2021   Last lipids Lab Results  Component Value Date   CHOL 142 09/18/2021   HDL 53 09/18/2021   LDLCALC 72 09/18/2021   TRIG 87 09/18/2021   CHOLHDL 2.7 09/18/2021   Last hemoglobin A1c Lab Results  Component Value Date   HGBA1C 6.6 (A) 09/18/2021   Last thyroid functions Lab Results  Component Value Date   TSH 0.795 03/29/2020   Last vitamin D Lab Results  Component Value Date   VD25OH 46 10/13/2012        Assessment & Plan:   Routine general medical examination at a health care facility  Encounter for Medicare annual wellness exam  Hypertension associated with diabetes ([place])  Osteopenia, unspecified location  Glaucoma, unspecified glaucoma type, unspecified laterality  Controlled type 2 diabetes mellitus with complication, without long-term current use of insulin ([place])  Hyperlipidemia associated with type 2 diabetes mellitus ([place])  Type 2 diabetes mellitus with morbid obesity ([place])  Anxiety and depression  Status post cataract extraction, unspecified laterality   Immunization  History  Administered Date(s) Administered   Fluad Quad(high Dose 65+) 09/14/2019, 09/08/2020, 09/18/2021   Influenza Split 06/30/2012, 08/02/2013   Influenza Whole 06/27/2011   Influenza, High Dose Seasonal PF 09/13/2015, 08/28/2016, 07/01/2017, 09/11/2018   Influenza,inj,Quad PF,6+ Mos 06/10/2014   PFIZER Comirnaty(Gray Top)Covid-19 Tri-Sucrose Vaccine 03/15/2021   PFIZER(Purple Top)SARS-COV-2 Vaccination 12/19/2019, 01/09/2020, 08/20/2020   Pfizer Covid-19 Vaccine Bivalent Booster 81yr & up 09/18/2021   Pneumococcal Conjugate-13 09/07/2014, 05/01/2017   Pneumococcal Polysaccharide-23 02/28/2012   Tdap 01/23/2007, 05/01/2017   Zoster Recombinat (Shingrix) 08/01/2017, 12/19/2017   Zoster, Live 01/23/2007    Health Maintenance  Topic Date Due   COVID-19 Vaccine (6 - Pfizer series) 01/16/2022   HEMOGLOBIN A1C  03/18/2022   INFLUENZA VACCINE  05/22/2022   FOOT EXAM  09/18/2022   OPHTHALMOLOGY EXAM  10/20/2022   MAMMOGRAM  09/26/2023   TETANUS/TDAP  05/02/2027   COLONOSCOPY (Pts 45-109yr Insurance coverage will need to be confirmed)  12/10/2029   DEXA SCAN  Completed   Hepatitis C Screening  Completed   Zoster Vaccines- Shingrix  Completed   HPV VACCINES  Aged Out   Pneumonia Vaccine 71 Years old  Discontinued    Discussed health benefits of physical activity, and encouraged her to engage in regular exercise appropriate for her age and condition. Recheck here in about 4 months.  I will do blood work at that time.  Congratulated her on taking good care of herself.       KElyse Jarvis RMA

## 2022-04-03 ENCOUNTER — Ambulatory Visit (INDEPENDENT_AMBULATORY_CARE_PROVIDER_SITE_OTHER): Payer: Medicare Other | Admitting: Family Medicine

## 2022-04-03 ENCOUNTER — Encounter: Payer: Self-pay | Admitting: Family Medicine

## 2022-04-03 VITALS — BP 128/72 | HR 54 | Temp 96.8°F | Wt 223.4 lb

## 2022-04-03 DIAGNOSIS — G5711 Meralgia paresthetica, right lower limb: Secondary | ICD-10-CM

## 2022-04-03 NOTE — Progress Notes (Signed)
   Subjective:    Patient ID: [name], female    DOB: May 25, 1951, 71 y.o.   MRN: [id number]  HPI She continues to have difficulty with discomfort in the right hip and thigh area.  She notes the discomfort especially when she sits and points to the anterior as well as lateral thigh area.  Motion does not hurt.  Physical activity has no effect on this.  Recent x-ray shows the hip to look normal.   Review of Systems     Objective:   Physical Exam Alert and in no distress.  Full motion of the hip.  Slight sensitivity is noted to the lateral proximal thigh area.       Assessment & Plan:  Meralgia paresthetica of right side I explained the fact that this is a cutaneous nerve compression issue.  Recommend she wear loosefitting close and when she sits to try and sit with a different position to take the pressure off of that area.  She expressed understanding of this.  Hopefully this is all we will need to do.  If continued difficulty we will reevaluate.

## 2022-05-01 ENCOUNTER — Other Ambulatory Visit: Payer: Self-pay

## 2022-05-01 ENCOUNTER — Emergency Department (HOSPITAL_COMMUNITY)
Admission: EM | Admit: 2022-05-01 | Discharge: 2022-05-01 | Disposition: A | Discharge status: Home / Self Care | Payer: Medicare Other | Attending: Emergency Medicine | Admitting: Emergency Medicine

## 2022-05-01 ENCOUNTER — Encounter (HOSPITAL_COMMUNITY): Payer: Self-pay

## 2022-05-01 DIAGNOSIS — I10 Essential (primary) hypertension: Secondary | ICD-10-CM | POA: Diagnosis not present

## 2022-05-01 DIAGNOSIS — E1165 Type 2 diabetes mellitus with hyperglycemia: Secondary | ICD-10-CM | POA: Diagnosis not present

## 2022-05-01 DIAGNOSIS — Z743 Need for continuous supervision: Secondary | ICD-10-CM | POA: Diagnosis not present

## 2022-05-01 DIAGNOSIS — Z79899 Other long term (current) drug therapy: Secondary | ICD-10-CM | POA: Insufficient documentation

## 2022-05-01 DIAGNOSIS — R112 Nausea with vomiting, unspecified: Secondary | ICD-10-CM | POA: Insufficient documentation

## 2022-05-01 DIAGNOSIS — Z7982 Long term (current) use of aspirin: Secondary | ICD-10-CM | POA: Insufficient documentation

## 2022-05-01 DIAGNOSIS — R58 Hemorrhage, not elsewhere classified: Secondary | ICD-10-CM | POA: Diagnosis not present

## 2022-05-01 DIAGNOSIS — K921 Melena: Secondary | ICD-10-CM | POA: Insufficient documentation

## 2022-05-01 DIAGNOSIS — R6889 Other general symptoms and signs: Secondary | ICD-10-CM | POA: Diagnosis not present

## 2022-05-01 DIAGNOSIS — Z7984 Long term (current) use of oral hypoglycemic drugs: Secondary | ICD-10-CM | POA: Insufficient documentation

## 2022-05-01 DIAGNOSIS — R195 Other fecal abnormalities: Secondary | ICD-10-CM

## 2022-05-01 DIAGNOSIS — R110 Nausea: Secondary | ICD-10-CM | POA: Diagnosis not present

## 2022-05-01 LAB — CBC WITH DIFFERENTIAL/PLATELET
Abs Immature Granulocytes: 0.01 10*3/uL (ref 0.00–0.07)
Basophils Absolute: 0 10*3/uL (ref 0.0–0.1)
Basophils Relative: 0 %
Eosinophils Absolute: 0.1 10*3/uL (ref 0.0–0.5)
Eosinophils Relative: 1 %
HCT: 38.3 % (ref 36.0–46.0)
Hemoglobin: 12.3 g/dL (ref 12.0–15.0)
Immature Granulocytes: 0 %
Lymphocytes Relative: 9 %
Lymphs Abs: 0.6 10*3/uL — ABNORMAL LOW (ref 0.7–4.0)
MCH: 30.9 pg (ref 26.0–34.0)
MCHC: 32.1 g/dL (ref 30.0–36.0)
MCV: 96.2 fL (ref 80.0–100.0)
Monocytes Absolute: 0.5 10*3/uL (ref 0.1–1.0)
Monocytes Relative: 7 %
Neutro Abs: 5.8 10*3/uL (ref 1.7–7.7)
Neutrophils Relative %: 83 %
Platelets: 240 10*3/uL (ref 150–400)
RBC: 3.98 MIL/uL (ref 3.87–5.11)
RDW: 13.2 % (ref 11.5–15.5)
WBC: 7 10*3/uL (ref 4.0–10.5)
nRBC: 0 % (ref 0.0–0.2)

## 2022-05-01 LAB — TYPE AND SCREEN
ABO/RH(D): O POS
Antibody Screen: NEGATIVE

## 2022-05-01 LAB — COMPREHENSIVE METABOLIC PANEL WITH GFR
ALT: 21 U/L (ref 0–44)
AST: 20 U/L (ref 15–41)
Albumin: 3.9 g/dL (ref 3.5–5.0)
Alkaline Phosphatase: 64 U/L (ref 38–126)
Anion gap: 7 (ref 5–15)
BUN: 20 mg/dL (ref 8–23)
CO2: 28 mmol/L (ref 22–32)
Calcium: 9.7 mg/dL (ref 8.9–10.3)
Chloride: 105 mmol/L (ref 98–111)
Creatinine, Ser: 0.7 mg/dL (ref 0.44–1.00)
GFR, Estimated: >60 mL/min
Glucose, Bld: 146 mg/dL — ABNORMAL HIGH (ref 70–99)
Potassium: 3.8 mmol/L (ref 3.5–5.1)
Sodium: 140 mmol/L (ref 135–145)
Total Bilirubin: 0.3 mg/dL (ref 0.3–1.2)
Total Protein: 7.1 g/dL (ref 6.5–8.1)

## 2022-05-01 LAB — LIPASE, BLOOD: Lipase: 32 U/L (ref 11–51)

## 2022-05-01 LAB — POC OCCULT BLOOD, ED: Fecal Occult Bld: NEGATIVE

## 2022-05-01 LAB — PROTIME-INR
INR: 1 (ref 0.8–1.2)
Prothrombin Time: 12.7 seconds (ref 11.4–15.2)

## 2022-05-01 MED ORDER — ONDANSETRON HCL 4 MG/2ML IJ SOLN
4.0000 mg | Freq: Once | INTRAMUSCULAR | Status: AC
  Administered 2022-05-01: 4 mg via INTRAVENOUS
  Filled 2022-05-01: qty 2

## 2022-05-01 MED ORDER — PANTOPRAZOLE SODIUM 40 MG PO TBEC: 40.0000 mg | DELAYED_RELEASE_TABLET | Freq: Every day | ORAL | 0 refills | Status: DC

## 2022-05-01 MED ORDER — SODIUM CHLORIDE 0.9 % IV BOLUS
500.0000 mL | Freq: Once | INTRAVENOUS | Status: AC
Start: 2022-05-01 — End: 2022-05-01
  Administered 2022-05-01: 500 mL via INTRAVENOUS

## 2022-05-01 MED ORDER — PANTOPRAZOLE SODIUM 40 MG IV SOLR
40.0000 mg | Freq: Once | INTRAVENOUS | Status: AC
Start: 2022-05-01 — End: 2022-05-01
  Administered 2022-05-01: 40 mg via INTRAVENOUS
  Filled 2022-05-01: qty 10

## 2022-05-01 NOTE — ED Triage Notes (Signed)
Pt bib ems for dark stools x2-3 days.  Pt states 1 episode of vomiting this am that was also dark.  Pt denies abd pain or hx of GI issues.

## 2022-05-01 NOTE — ED Notes (Signed)
Pt given Apple juice and gram crackers for PO challenge.

## 2022-05-01 NOTE — ED Notes (Signed)
Pt states understanding of dc instructions, importance of follow up, and prescription. Pt assisted into wheelchair and wheeled out to private vehicle. No belongings left in room upon dc.

## 2022-05-01 NOTE — Discharge Instructions (Addendum)
You were seen in the emergency department for dark loose stools.  Your rectal exam did not show any evidence of blood and your blood counts were stable.  Please stay well-hydrated and follow-up with your primary care doctor.  Return to the emergency department if any worsening or concerning symptoms.  We are starting you on some acid medication in case you have the beginnings of some gastritis or ulcer.

## 2022-05-01 NOTE — ED Provider Notes (Signed)
[name] Provider Note   CSN: [id number] Arrival date & time: 05/01/22  3235     History  Chief Complaint  Patient presents with   Melena    [name] is a 71 y.o. female.  She has history of hypertension diabetes hyperlipidemia.  She has had multiple soft black stools over the past 2 or 3 days.  She said probably 5 a day.  Today it was more watery and associated with nausea and vomiting.  She said the vomit was also dark.  No abdominal pain chest pain shortness of breath fevers or chills.  No dizziness lightheadedness.  She is on a daily aspirin no other blood thinners.  No prior history of GI bleed or GI symptoms.  Past surgical history of hysterectomy.  No urinary symptoms no nosebleed or blood in urine no vaginal bleeding  The history is provided by the patient.  Rectal Bleeding Quality:  Black and tarry Amount:  Moderate Duration:  2 days Timing:  Sporadic Chronicity:  New Context: defecation   Similar prior episodes: no   Relieved by:  None tried Worsened by:  Nothing Ineffective treatments:  None tried Associated symptoms: vomiting   Associated symptoms: no abdominal pain, no dizziness, no epistaxis, no fever, no hematemesis, no light-headedness and no loss of consciousness        Home Medications Prior to Admission medications   Medication Sig Start Date End Date Taking? Authorizing Provider  acetaminophen (TYLENOL) 325 MG tablet Take 325-650 mg by mouth every 6 (six) hours as needed. Reported on 04/26/2016 Patient not taking: Reported on 02/07/2022 10/13/12   Ainsley Spinner, PA-C  aspirin EC 81 MG tablet Take 81 mg by mouth daily.    [provider]  atorvastatin (LIPITOR) 10 MG tablet TAKE 1 TABLET(10 MG) BY MOUTH DAILY 03/08/22   Denita Lung, MD  betamethasone valerate lotion (VALISONE) 0.1 % Apply 1 application topically 2 (two) times daily as needed. Patient not taking: Reported on 09/18/2021    [provider]  calcium carbonate (OS-CAL) 600 MG TABS Take 600 mg by mouth 2 (two) times daily with a meal.    [provider]  Cholecalciferol (VITAMIN D) 2000 UNITS tablet Take 2,000 Units by mouth daily.    [provider]  clobetasol (TEMOVATE) 0.05 % external solution Uses as needed Patient not taking: Reported on 04/03/2022 11/21/15   [provider]  Lancets (ONETOUCH ULTRASOFT) lancets USE AS DIRECTED TO TEST ONCE DAILY. 09/21/21   Denita Lung, MD  lisinopril-hydrochlorothiazide (ZESTORETIC) 20-12.5 MG tablet TAKE 1 TABLET BY MOUTH DAILY 11/09/21   Denita Lung, MD  loratadine (CLARITIN) 10 MG tablet Take 10 mg by mouth daily as needed for allergies. Patient not taking: Reported on 02/07/2022    [provider]  MegaRed Omega-3 Krill Oil 500 MG CAPS Take 2 capsules by mouth daily.    [provider]  metFORMIN (GLUCOPHAGE) 500 MG tablet TAKE 1 TABLET(500 MG) BY MOUTH TWICE DAILY WITH A MEAL 11/30/21   Denita Lung, MD  Misc Natural Products (COSAMIN ASU ADVANCED FORMULA) CAPS Take 2 each by mouth 2 (two) times daily.    [provider]  mometasone (ELOCON) 0.1 % cream APPLY TO EXTERNAL EAR DAILY AS NEEDED FOR ITCHING OF DRY SKIN Patient not taking: Reported on 09/18/2021 12/07/19   [provider]  Multiple Vitamins-Minerals (MULTIVITAMIN WITH MINERALS) tablet Take 1 tablet by mouth daily.    [provider]  Multiple Vitamins-Minerals (PRESERVISION AREDS 2+MULTI VIT PO) Take by mouth.    [provider]  mupirocin ointment (BACTROBAN) 2 % Place 1 application into the nose 2 (two) times daily. Patient not taking: Reported on 04/03/2022 05/12/20   Denita Lung, MD  [facility] ULTRA test strip TEST ONCE DAILY AS DIRECTED 05/03/21   Denita Lung, MD  pioglitazone (ACTOS) 30 MG tablet TAKE 1 TABLET(30 MG) BY MOUTH DAILY 02/07/22   Denita Lung, MD      Allergies    Patient has no known allergies.     Review of Systems   Review of Systems  Constitutional:  Negative for fever.  HENT:  Negative for nosebleeds.   Respiratory:  Negative for shortness of breath.   Cardiovascular:  Negative for chest pain.  Gastrointestinal:  Positive for diarrhea, hematochezia, nausea and vomiting. Negative for abdominal pain, hematemesis and rectal pain.  Neurological:  Negative for dizziness, loss of consciousness and light-headedness.    Physical Exam Updated Vital Signs BP (!) 141/68 (BP Location: Right Arm)   Pulse 77   Temp 97.9 F (36.6 C) (Oral)   Resp 18   Ht '5\' 3"'$  (1.6 m)   Wt 95.3 kg   SpO2 98%   BMI 37.20 kg/m  Physical Exam Vitals and nursing note reviewed.  Constitutional:      General: She is not in acute distress.    Appearance: Normal appearance. She is well-developed. She is obese.  HENT:     Head: Normocephalic and atraumatic.  Eyes:     Conjunctiva/sclera: Conjunctivae normal.  Cardiovascular:     Rate and Rhythm: Normal rate and regular rhythm.     Heart sounds: No murmur heard. Pulmonary:     Effort: Pulmonary effort is normal. No respiratory distress.     Breath sounds: Normal breath sounds.  Abdominal:     Palpations: Abdomen is soft.     Tenderness: There is no abdominal tenderness. There is no guarding or rebound.  Musculoskeletal:     Cervical back: Neck supple.     Right lower leg: No edema.     Left lower leg: No edema.  Skin:    General: Skin is warm and dry.     Capillary Refill: Capillary refill takes less than 2 seconds.  Neurological:     General: No focal deficit present.     Mental Status: She is alert.     ED Results / Procedures / Treatments   Labs (all labs ordered are listed, but only abnormal results are displayed) Labs Reviewed  COMPREHENSIVE METABOLIC PANEL - Abnormal; Notable for the following components:      Result Value   Glucose, Bld 146 (*)    All other components within normal limits  CBC WITH DIFFERENTIAL/PLATELET -  Abnormal; Notable for the following components:   Lymphs Abs 0.6 (*)    All other components within normal limits  PROTIME-INR  LIPASE, BLOOD  POC OCCULT BLOOD, ED  TYPE AND SCREEN    EKG None  Radiology No results found.  Procedures Procedures    Medications Ordered in ED Medications  pantoprazole (PROTONIX) injection 40 mg (has no administration in time range)  ondansetron (ZOFRAN) injection 4 mg (has no administration in time range)  sodium chloride 0.9 % bolus 500 mL (has no administration in time range)    ED Course/ Medical Decision Making/ A&P Clinical Course as of 05/01/22 1925  Tue May 01, 2022  1040 Patient tolerating p.o.  here.  Hemoglobin stable heme-negative from below normal BUN and creatinine.  No evidence of significant GI bleed.  Patient did endorse she took some Pepto-Bismol but was having the dark stools even before that.  I offered her Zofran and Imodium which she declines.  I recommended close follow-up with PCP and return instructions discussed [MB]    Clinical Course User Index [MB] Hayden Rasmussen, MD                           Medical Decision Making Amount and/or Complexity of Data Reviewed Labs: ordered.  Risk Prescription drug management.  This patient complains of dark-colored diarrhea, nausea and vomiting; this involves an extensive number of treatment Options and is a complaint that carries with it a high risk of complications and morbidity. The differential includes GI bleed, melena, gastritis, peptic ulcer disease, infectious diarrhea, colitis  I ordered, reviewed and interpreted labs, which included CBC with normal white count normal hemoglobin, chemistries normal other than mildly elevated glucose, LFTs normal, fecal occult negative I ordered medication IV fluids PPI and Zofran and reviewed PMP when indicated.  Additional history obtained from patient significant other Previous records obtained and reviewed in epic no recent  admissions  Cardiac monitoring reviewed, normal sinus rhythm Social determinants considered, patient is physically inactive Critical Interventions: None  After the interventions stated above, I reevaluated the patient and found patient with no active diarrhea or vomiting no abdominal pain Admission and further testing considered, no indications for admission at this time.  We will put on PPI and recommended close follow-up with PCP.  Consider GI referral.  Return instructions discussed          Final Clinical Impression(s) / ED Diagnoses Final diagnoses:  Dark stools    Rx / DC Orders ED Discharge Orders          Ordered    pantoprazole (PROTONIX) 40 MG tablet  Daily        05/01/22 1042              Hayden Rasmussen, MD 05/01/22 1927

## 2022-05-01 NOTE — ED Notes (Signed)
Pt tolerated po challenge well. Pt denies nausea or vomiting.

## 2022-05-02 ENCOUNTER — Telehealth: Payer: Self-pay

## 2022-05-02 NOTE — Telephone Encounter (Signed)
Transition Care Management Follow-up Telephone Call Date of discharge and from where: [name] 05/01/22 How have you been since you were released from the hospital? [name] Any questions or concerns? No  Items Reviewed: Did the pt receive and understand the discharge instructions provided? Yes  Medications obtained and verified? Yes  Other? No  Any new allergies since your discharge? No  Dietary orders reviewed? Yes Do you have support at home? Yes   Home Care and Equipment/Supplies: Were home health services ordered? no Functional Questionnaire: (I = Independent and D = Dependent) ADLs: I  Bathing/Dressing- I  Meal Prep- I  Eating- I  Maintaining continence- I  Transferring/Ambulation- I  Managing Meds- I  Follow up appointments reviewed:  PCP Hospital f/u appt confirmed? Yes  Scheduled to see Dr. Redmond School on 05/03/22 @ 3. [facility] f/u appt confirmed? No   Are transportation arrangements needed? No  If their condition worsens, is the pt aware to call PCP or go to the Emergency Dept.? Yes Was the patient provided with contact information for the PCP's office or ED? Yes Was to pt encouraged to call back with questions or concerns? Yes

## 2022-05-03 ENCOUNTER — Ambulatory Visit (INDEPENDENT_AMBULATORY_CARE_PROVIDER_SITE_OTHER): Payer: Medicare Other | Admitting: Family Medicine

## 2022-05-03 VITALS — BP 130/70 | HR 84 | Temp 98.8°F | Wt 225.0 lb

## 2022-05-03 DIAGNOSIS — K529 Noninfective gastroenteritis and colitis, unspecified: Secondary | ICD-10-CM

## 2022-05-03 NOTE — Progress Notes (Deleted)
   Follow-Up Visit   Subjective  [name] is a 71 y.o. female who presents for the following: No chief complaint on file..  ***  The following portions of the chart were reviewed this encounter and updated as appropriate:      {Review of Systems:34166::"No other skin or systemic complaints."}  Objective  Well appearing patient in no apparent distress; mood and affect are within normal limits.  {ZFPO:25189::"Q full examination was performed including scalp, head, eyes, ears, nose, lips, neck, chest, axillae, abdomen, back, buttocks, bilateral upper extremities, bilateral lower extremities, hands, feet, fingers, toes, fingernails, and toenails. All findings within normal limits unless otherwise noted below."}   Assessment & Plan   No follow-ups on file.

## 2022-05-03 NOTE — Progress Notes (Signed)
   Subjective:    Patient ID: [name], female    DOB: September 16, 1951, 71 y.o.   MRN: [id number]  HPI She is here for a posthospital emergency room evaluation after being seen for nausea, vomiting and diarrhea as well as black stools and black vomitus.  The emergency room record including blood work was evaluated.  Her stool was guaiac negative.  Hemoglobin appeared normal.  Presently she is having no nausea, vomiting, abdominal pain or diarrhea.   Review of Systems     Objective:   Physical Exam Alert and in no distress.  Cardiac exam shows regular rhythm without murmurs or gallops.  Lungs are clear to auscultation.  Abdominal exam shows no masses or tenderness.       Assessment & Plan:  Acute gastroenteritis Since she is doing well at the present time, no further intervention needed.  She was given pantoprazole and I recommend that she use this on an as-needed basis.

## 2022-05-12 ENCOUNTER — Other Ambulatory Visit: Payer: Self-pay | Admitting: Family Medicine

## 2022-05-12 DIAGNOSIS — E1159 Type 2 diabetes mellitus with other circulatory complications: Secondary | ICD-10-CM

## 2022-06-06 ENCOUNTER — Other Ambulatory Visit: Payer: Self-pay | Admitting: Family Medicine

## 2022-06-06 DIAGNOSIS — E118 Type 2 diabetes mellitus with unspecified complications: Secondary | ICD-10-CM

## 2022-06-20 ENCOUNTER — Other Ambulatory Visit: Payer: Self-pay | Admitting: Family Medicine

## 2022-06-20 DIAGNOSIS — E785 Hyperlipidemia, unspecified: Secondary | ICD-10-CM

## 2022-06-27 ENCOUNTER — Encounter: Payer: Self-pay | Admitting: Internal Medicine

## 2022-07-12 ENCOUNTER — Encounter: Payer: Self-pay | Admitting: *Deleted

## 2022-07-12 NOTE — Progress Notes (Signed)
[facility] Quality Team Note  Name: JORENE KAYLOR Date of Birth: 01-08-1951 MRN: [id number] Date: 07/12/2022  [facility] Quality Team has reviewed this patient's chart, please see recommendations below:  [place]; [place] Coordinator mailed patient a [place] home kit. [facility] will fax results directly to provider office.

## 2022-07-16 NOTE — Progress Notes (Signed)
fyi

## 2022-07-31 ENCOUNTER — Encounter: Payer: Self-pay | Admitting: Internal Medicine

## 2022-07-31 ENCOUNTER — Telehealth: Payer: Self-pay | Admitting: Family Medicine

## 2022-07-31 MED ORDER — MUPIROCIN 2 % EX OINT: 1.0000 | TOPICAL_OINTMENT | Freq: Two times a day (BID) | CUTANEOUS | 1 refills | Status: AC

## 2022-07-31 NOTE — Telephone Encounter (Signed)
Pt called and is requesting a refill on her mupirocin 2% cream please send to the  [facility], [place] AT [address]

## 2022-08-03 DIAGNOSIS — E119 Type 2 diabetes mellitus without complications: Secondary | ICD-10-CM | POA: Diagnosis not present

## 2022-08-13 ENCOUNTER — Encounter: Payer: Self-pay | Admitting: Internal Medicine

## 2022-08-16 ENCOUNTER — Other Ambulatory Visit (INDEPENDENT_AMBULATORY_CARE_PROVIDER_SITE_OTHER): Payer: Medicare Other

## 2022-08-16 DIAGNOSIS — Z23 Encounter for immunization: Secondary | ICD-10-CM

## 2022-08-20 ENCOUNTER — Other Ambulatory Visit: Payer: Self-pay | Admitting: Family Medicine

## 2022-08-20 DIAGNOSIS — E118 Type 2 diabetes mellitus with unspecified complications: Secondary | ICD-10-CM

## 2022-09-24 ENCOUNTER — Other Ambulatory Visit: Payer: Self-pay | Admitting: Family Medicine

## 2022-09-24 DIAGNOSIS — E785 Hyperlipidemia, unspecified: Secondary | ICD-10-CM

## 2022-10-08 DIAGNOSIS — Z1231 Encounter for screening mammogram for malignant neoplasm of breast: Secondary | ICD-10-CM | POA: Diagnosis not present

## 2022-10-08 LAB — HM MAMMOGRAPHY

## 2022-10-24 DIAGNOSIS — Z961 Presence of intraocular lens: Secondary | ICD-10-CM | POA: Diagnosis not present

## 2022-10-24 DIAGNOSIS — H35372 Puckering of macula, left eye: Secondary | ICD-10-CM | POA: Diagnosis not present

## 2022-10-24 DIAGNOSIS — H26493 Other secondary cataract, bilateral: Secondary | ICD-10-CM | POA: Diagnosis not present

## 2022-10-24 DIAGNOSIS — H40033 Anatomical narrow angle, bilateral: Secondary | ICD-10-CM | POA: Diagnosis not present

## 2022-10-24 DIAGNOSIS — H43811 Vitreous degeneration, right eye: Secondary | ICD-10-CM | POA: Diagnosis not present

## 2022-10-24 DIAGNOSIS — H53021 Refractive amblyopia, right eye: Secondary | ICD-10-CM | POA: Diagnosis not present

## 2022-10-24 DIAGNOSIS — E119 Type 2 diabetes mellitus without complications: Secondary | ICD-10-CM | POA: Diagnosis not present

## 2022-10-24 LAB — HM DIABETES EYE EXAM

## 2022-11-21 DIAGNOSIS — L814 Other melanin hyperpigmentation: Secondary | ICD-10-CM | POA: Diagnosis not present

## 2022-11-21 DIAGNOSIS — Z08 Encounter for follow-up examination after completed treatment for malignant neoplasm: Secondary | ICD-10-CM | POA: Diagnosis not present

## 2022-11-21 DIAGNOSIS — Z85828 Personal history of other malignant neoplasm of skin: Secondary | ICD-10-CM | POA: Diagnosis not present

## 2022-11-21 DIAGNOSIS — L821 Other seborrheic keratosis: Secondary | ICD-10-CM | POA: Diagnosis not present

## 2022-11-21 DIAGNOSIS — D225 Melanocytic nevi of trunk: Secondary | ICD-10-CM | POA: Diagnosis not present

## 2022-11-25 ENCOUNTER — Other Ambulatory Visit: Payer: Self-pay | Admitting: Family Medicine

## 2022-11-25 DIAGNOSIS — I152 Hypertension secondary to endocrine disorders: Secondary | ICD-10-CM

## 2022-12-04 ENCOUNTER — Other Ambulatory Visit: Payer: Self-pay | Admitting: Family Medicine

## 2022-12-04 DIAGNOSIS — E118 Type 2 diabetes mellitus with unspecified complications: Secondary | ICD-10-CM

## 2022-12-21 ENCOUNTER — Encounter (INDEPENDENT_AMBULATORY_CARE_PROVIDER_SITE_OTHER): Payer: Medicare Other | Admitting: Ophthalmology

## 2022-12-26 DIAGNOSIS — H1032 Unspecified acute conjunctivitis, left eye: Secondary | ICD-10-CM | POA: Diagnosis not present

## 2023-01-23 ENCOUNTER — Encounter: Payer: Self-pay | Admitting: Family Medicine

## 2023-01-23 ENCOUNTER — Telehealth (INDEPENDENT_AMBULATORY_CARE_PROVIDER_SITE_OTHER): Payer: Medicare Other | Admitting: Family Medicine

## 2023-01-23 VITALS — Temp 98.4°F | Wt 220.0 lb

## 2023-01-23 DIAGNOSIS — J069 Acute upper respiratory infection, unspecified: Secondary | ICD-10-CM

## 2023-01-23 NOTE — Progress Notes (Signed)
   Subjective:    Patient ID: [name], female    DOB: [birth year], 72 y.o.   MRN: [id number]  HPI Documentation for virtual audio and video telecommunications through [facility] encounter: The patient was located at home. 2 patient identifiers used.  The provider was located in the office. The patient did consent to this visit and is aware of possible charges through their insurance for this visit. The other persons participating in this telemedicine service were none. Time spent on call was 5 minutes and in review of previous records >15 minutes total for counseling and coordination of care. This virtual service is not related to other E/M service within previous 7 days.  She complains of a 2-day history of sneezing, rhinorrhea, cough that is intermittently slightly productive.  She did run a temperature of 100.4 and had some chills.  Review of Systems     Objective:   Physical Exam Alert and in no distress otherwise not examined       Assessment & Plan:  Viral URI with cough Recommend COVID testing and if positive she will call back and I will give more instructions on what would need to be done otherwise symptomatic care is appropriate.  She was comfortable with that.

## 2023-01-25 ENCOUNTER — Encounter: Payer: Self-pay | Admitting: Medical

## 2023-01-25 ENCOUNTER — Ambulatory Visit (INDEPENDENT_AMBULATORY_CARE_PROVIDER_SITE_OTHER): Payer: Medicare Other | Admitting: Medical

## 2023-01-25 VITALS — BP 134/74 | HR 65 | Temp 98.8°F | Ht 64.0 in | Wt 217.0 lb

## 2023-01-25 DIAGNOSIS — J988 Other specified respiratory disorders: Secondary | ICD-10-CM | POA: Diagnosis not present

## 2023-01-25 DIAGNOSIS — R058 Other specified cough: Secondary | ICD-10-CM | POA: Diagnosis not present

## 2023-01-25 DIAGNOSIS — E1169 Type 2 diabetes mellitus with other specified complication: Secondary | ICD-10-CM | POA: Diagnosis not present

## 2023-01-25 MED ORDER — HYDROCODONE BIT-HOMATROP MBR 5-1.5 MG/5ML PO SOLN: 5.0000 mL | Freq: Three times a day (TID) | ORAL | 0 refills | Status: AC | PRN

## 2023-01-25 MED ORDER — AZITHROMYCIN 250 MG PO TABS: ORAL_TABLET | ORAL | 0 refills | Status: DC

## 2023-01-25 NOTE — Progress Notes (Signed)
Subjective:  [name] is a 72 y.o. female who presents for Chief Complaint  Patient presents with   Cough    Cough x 1 week, negative covid test (can't remember which day-thinks Wed). Started like her normal allergies with some runny nose, now just cough. Mucus is brownish in color. Temp was 101.4 Wed.      Here for cough.  Has had at least a week of cough, has had runny nose.   Did covid test in the past week that was negative.  No sore throat, no sinus pressure, no ear pain.  Had fever 2 days ago 101, and had feverish other days.   No NVD.   No SOB or wheezing.  Using some mucinex.  No sick contacts.   Getting up some brownish mucous.   Compared to a week ago, feels no improvement.   Not worse or better.  No other aggravating or relieving factors.    No other c/o.  Past Medical History:  Diagnosis Date   Basal cell carcinoma of nose 11/30/13   [facility]    Diabetes mellitus    Hyperlipidemia    Hypertension    Obesity    Current Outpatient Medications on File Prior to Visit  Medication Sig Dispense Refill   aspirin EC 81 MG tablet Take 81 mg by mouth daily.     atorvastatin (LIPITOR) 10 MG tablet TAKE 1 TABLET(10 MG) BY MOUTH DAILY 90 tablet 1   calcium carbonate (OS-CAL) 600 MG TABS Take 600 mg by mouth 2 (two) times daily with a meal.     Cholecalciferol (VITAMIN D) 2000 UNITS tablet Take 2,000 Units by mouth daily.     Lancets (ONETOUCH ULTRASOFT) lancets USE TO TEST ONCE DAILY 200 each 0   lisinopril-hydrochlorothiazide (ZESTORETIC) 20-12.5 MG tablet TAKE 1 TABLET BY MOUTH DAILY 90 tablet 1   MegaRed Omega-3 Krill Oil 500 MG CAPS Take 2 capsules by mouth daily.     metFORMIN (GLUCOPHAGE) 500 MG tablet TAKE 1 TABLET(500 MG) BY MOUTH TWICE DAILY WITH A MEAL 180 tablet 1   Misc Natural Products (COSAMIN ASU ADVANCED FORMULA) CAPS Take 2 each by mouth 2 (two) times daily.     Multiple Vitamins-Minerals (MULTIVITAMIN WITH MINERALS) tablet Take 1 tablet by mouth  daily.     Multiple Vitamins-Minerals (PRESERVISION AREDS 2+MULTI VIT PO) Take by mouth.     mupirocin ointment (BACTROBAN) 2 % Place 1 Application into the nose 2 (two) times daily. 22 g 1   ONETOUCH ULTRA test strip AS DIRECTED ONCE DAILY 100 strip 3   pioglitazone (ACTOS) 30 MG tablet TAKE 1 TABLET(30 MG) BY MOUTH DAILY 90 tablet 1   acetaminophen (TYLENOL) 325 MG tablet Take 325-650 mg by mouth every 6 (six) hours as needed. Reported on 04/26/2016 (Patient not taking: Reported on 01/25/2023)     betamethasone valerate lotion (VALISONE) 0.1 % Apply 1 application  topically 2 (two) times daily as needed. (Patient not taking: Reported on 01/25/2023)     clobetasol (TEMOVATE) 0.05 % external solution Uses as needed (Patient not taking: Reported on 01/25/2023)  2   loratadine (CLARITIN) 10 MG tablet Take 10 mg by mouth daily as needed for allergies. (Patient not taking: Reported on 01/25/2023)     mometasone (ELOCON) 0.1 % cream  (Patient not taking: Reported on 01/25/2023)     No current facility-administered medications on file prior to visit.     The following portions of the patient's history were  reviewed and updated as appropriate: allergies, current medications, past family history, past medical history, past social history, past surgical history and problem list.  ROS Otherwise as in subjective above  Objective: BP 134/74   Pulse 65   Temp 98.8 F (37.1 C) (Tympanic)   Ht 5\' 4"  (1.626 m)   Wt 217 lb (98.4 kg)   SpO2 98%   BMI 37.25 kg/m   General appearance: alert, no distress, well developed, well nourished HEENT: normocephalic, sclerae anicteric, conjunctiva pink and moist, TMs flat, nares patent, mucoid discharge + erythema, pharynx normal Oral cavity: MMM, no lesions Neck: supple, no lymphadenopathy, no thyromegaly, no masses Heart: RRR, normal S1, S2, no murmurs    Assessment: Encounter Diagnoses  Name Primary?   Cough productive of purulent sputum Yes   Respiratory tract  infection    Type 2 diabetes mellitus with morbid obesity      Plan: Continue Mucinex DM for the next 2 days, good water intake, rest, Tylenol for not feeling well or fever, begin Z-Pak, can use Hycodan for worse cough at night, caution on sedation with this cough syrup.  If not much improved in the next 3 to 4 days call back  [name] was seen today for cough.  Diagnoses and all orders for this visit:  Cough productive of purulent sputum  Respiratory tract infection  Type 2 diabetes mellitus with morbid obesity  Other orders -     azithromycin (ZITHROMAX) 250 MG tablet; 2 tablets day 1, then 1 tablet days 2-4 -     HYDROcodone bit-homatropine (HYCODAN) 5-1.5 MG/5ML syrup; Take 5 mLs by mouth every 8 (eight) hours as needed for up to 5 days for cough.    Follow up: prn

## 2023-02-26 ENCOUNTER — Other Ambulatory Visit: Payer: Self-pay | Admitting: Family Medicine

## 2023-02-26 DIAGNOSIS — E118 Type 2 diabetes mellitus with unspecified complications: Secondary | ICD-10-CM

## 2023-04-10 ENCOUNTER — Ambulatory Visit (INDEPENDENT_AMBULATORY_CARE_PROVIDER_SITE_OTHER): Payer: Medicare Other | Admitting: Family Medicine

## 2023-04-10 ENCOUNTER — Encounter: Payer: Self-pay | Admitting: Family Medicine

## 2023-04-10 VITALS — BP 138/70 | HR 66 | Temp 97.8°F | Resp 16 | Ht 62.25 in | Wt 215.6 lb

## 2023-04-10 DIAGNOSIS — F419 Anxiety disorder, unspecified: Secondary | ICD-10-CM

## 2023-04-10 DIAGNOSIS — E1169 Type 2 diabetes mellitus with other specified complication: Secondary | ICD-10-CM

## 2023-04-10 DIAGNOSIS — E1159 Type 2 diabetes mellitus with other circulatory complications: Secondary | ICD-10-CM | POA: Diagnosis not present

## 2023-04-10 DIAGNOSIS — M8580 Other specified disorders of bone density and structure, unspecified site: Secondary | ICD-10-CM | POA: Diagnosis not present

## 2023-04-10 DIAGNOSIS — E785 Hyperlipidemia, unspecified: Secondary | ICD-10-CM | POA: Diagnosis not present

## 2023-04-10 DIAGNOSIS — H409 Unspecified glaucoma: Secondary | ICD-10-CM | POA: Diagnosis not present

## 2023-04-10 DIAGNOSIS — J301 Allergic rhinitis due to pollen: Secondary | ICD-10-CM | POA: Diagnosis not present

## 2023-04-10 DIAGNOSIS — Z0000 Encounter for general adult medical examination without abnormal findings: Secondary | ICD-10-CM

## 2023-04-10 DIAGNOSIS — Z9849 Cataract extraction status, unspecified eye: Secondary | ICD-10-CM

## 2023-04-10 DIAGNOSIS — E118 Type 2 diabetes mellitus with unspecified complications: Secondary | ICD-10-CM

## 2023-04-10 DIAGNOSIS — L309 Dermatitis, unspecified: Secondary | ICD-10-CM | POA: Diagnosis not present

## 2023-04-10 DIAGNOSIS — I152 Hypertension secondary to endocrine disorders: Secondary | ICD-10-CM

## 2023-04-10 DIAGNOSIS — F325 Major depressive disorder, single episode, in full remission: Secondary | ICD-10-CM

## 2023-04-10 LAB — CBC WITH DIFFERENTIAL/PLATELET
Basophils Absolute: 0 10*3/uL (ref 0.0–0.2)
Basos: 1 %
EOS (ABSOLUTE): 0.1 10*3/uL (ref 0.0–0.4)
Eos: 2 %
Hematocrit: 36.5 % (ref 34.0–46.6)
Hemoglobin: 12.2 g/dL (ref 11.1–15.9)
Immature Grans (Abs): 0 10*3/uL (ref 0.0–0.1)
Immature Granulocytes: 0 %
Lymphocytes Absolute: 0.8 10*3/uL (ref 0.7–3.1)
Lymphs: 21 %
MCH: 31.6 pg (ref 26.6–33.0)
MCHC: 33.4 g/dL (ref 31.5–35.7)
MCV: 95 fL (ref 79–97)
Monocytes Absolute: 0.4 10*3/uL (ref 0.1–0.9)
Monocytes: 11 %
Neutrophils Absolute: 2.5 10*3/uL (ref 1.4–7.0)
Neutrophils: 65 %
Platelets: 241 10*3/uL (ref 150–450)
RBC: 3.86 x10E6/uL (ref 3.77–5.28)
RDW: 13 % (ref 11.7–15.4)
WBC: 3.8 10*3/uL (ref 3.4–10.8)

## 2023-04-10 LAB — COMPREHENSIVE METABOLIC PANEL
ALT: 19 IU/L (ref 0–32)
AST: 17 IU/L (ref 0–40)
Albumin: 4.5 g/dL (ref 3.8–4.8)
Alkaline Phosphatase: 75 IU/L (ref 44–121)
BUN/Creatinine Ratio: 29 — ABNORMAL HIGH (ref 12–28)
BUN: 22 mg/dL (ref 8–27)
Bilirubin Total: 0.5 mg/dL (ref 0.0–1.2)
CO2: 28 mmol/L (ref 20–29)
Calcium: 10.1 mg/dL (ref 8.7–10.3)
Chloride: 100 mmol/L (ref 96–106)
Creatinine, Ser: 0.76 mg/dL (ref 0.57–1.00)
Globulin, Total: 2.3 g/dL (ref 1.5–4.5)
Glucose: 159 mg/dL — ABNORMAL HIGH (ref 70–99)
Potassium: 4.8 mmol/L (ref 3.5–5.2)
Sodium: 140 mmol/L (ref 134–144)
Total Protein: 6.8 g/dL (ref 6.0–8.5)
eGFR: 83 mL/min/{1.73_m2} (ref >59)

## 2023-04-10 LAB — POCT UA - MICROALBUMIN
Albumin/Creatinine Ratio, Urine, POC: 9.3
Creatinine, POC: 123.1 mg/dL
Microalbumin Ur, POC: 11.4 mg/L

## 2023-04-10 LAB — LIPID PANEL
Chol/HDL Ratio: 2.8 ratio (ref 0.0–4.4)
Cholesterol, Total: 139 mg/dL (ref 100–199)
HDL: 50 mg/dL (ref >39)
LDL Chol Calc (NIH): 69 mg/dL (ref 0–99)
Triglycerides: 109 mg/dL (ref 0–149)
VLDL Cholesterol Cal: 20 mg/dL (ref 5–40)

## 2023-04-10 LAB — POCT GLYCOSYLATED HEMOGLOBIN (HGB A1C): Hemoglobin A1C: 7.3 % — AB (ref 4.0–5.6)

## 2023-04-10 MED ORDER — LISINOPRIL-HYDROCHLOROTHIAZIDE 20-12.5 MG PO TABS
1.0000 | ORAL_TABLET | Freq: Every day | ORAL | 3 refills | Status: AC
Start: 2023-04-10 — End: ?

## 2023-04-10 MED ORDER — ATORVASTATIN CALCIUM 10 MG PO TABS
ORAL_TABLET | ORAL | 3 refills | Status: DC
Start: 2023-04-10 — End: 2024-04-22

## 2023-04-10 MED ORDER — METFORMIN HCL 500 MG PO TABS
ORAL_TABLET | ORAL | 1 refills | Status: DC
Start: 2023-04-10 — End: 2023-12-06

## 2023-04-10 MED ORDER — PIOGLITAZONE HCL 30 MG PO TABS
ORAL_TABLET | ORAL | 1 refills | Status: DC
Start: 2023-04-10 — End: 2023-12-16

## 2023-04-10 NOTE — Progress Notes (Signed)
[name] is a 72 y.o. female who presents for annual wellness visit CPE and follow-up on chronic medical conditions.  She has eczema and uses mometasone mainly behind the ears and does get some fairly decent results but does not use it regularly.  She continues on metformin as well as Actos for her diabetes.  She continues on lisinopril/HCTZ and atorvastatin.  Having no difficulty with these medications.  Her allergies seem to be under good control.  She does have a history of osteopenia and does use a multivitamin.  She does see her ophthalmologist regularly.  She does have a history of glaucoma as well as cataract extraction.  Psychologically she seems to be doing quite nicely.  She is retired.  She and her husband get along well.  She has no other concerns or complaints.  Immunizations and Health Maintenance Immunization History  Administered Date(s) Administered   COVID-19, mRNA, vaccine(Comirnaty)12 years and older 08/16/2022   Fluad Quad(high Dose 65+) 09/14/2019, 09/08/2020, 09/18/2021, 08/16/2022   Influenza Split 06/30/2012, 08/02/2013   Influenza Whole 06/27/2011   Influenza, High Dose Seasonal PF 09/13/2015, 08/28/2016, 07/01/2017, 09/11/2018   Influenza,inj,Quad PF,6+ Mos 06/10/2014   PFIZER Comirnaty(Gray Top)Covid-19 Tri-Sucrose Vaccine 03/15/2021   PFIZER(Purple Top)SARS-COV-2 Vaccination 12/19/2019, 01/09/2020, 08/20/2020   Pfizer Covid-19 Vaccine Bivalent Booster 46yrs & up 09/18/2021   Pneumococcal Conjugate-13 09/07/2014, 05/01/2017   Pneumococcal Polysaccharide-23 02/28/2012   Tdap 01/23/2007, 05/01/2017   Zoster Recombinat (Shingrix) 08/01/2017, 12/19/2017   Zoster, Live 01/23/2007   Health Maintenance Due  Topic Date Due   FOOT EXAM  03/31/2023   Diabetic kidney evaluation - eGFR measurement  05/02/2023    Last Pap smear: n/a Last mammogram: 10/08/2022 Last colonoscopy: 12/11/2019 Last DEXA: 2022 Dentist: within the last year Ophtho: within the last  year Exercise: 3 times a week walking  Other doctors caring for patient include: [facility]ist-  Dr. Alan Mulder   Advanced directives: Yes. On file    Depression screen:  See questionnaire below.     04/10/2023    9:45 AM 03/16/2022   10:03 AM 03/15/2021    9:36 AM 03/14/2020    9:46 AM 12/30/2018    8:30 AM  Depression screen PHQ 2/9  Decreased Interest 0 0 0 0 0  Down, Depressed, Hopeless 0 0 0 0 0  PHQ - 2 Score 0 0 0 0 0  Altered sleeping  0     Tired, decreased energy  0     Change in appetite  0     Feeling bad or failure about yourself   0     Trouble concentrating  0     Moving slowly or fidgety/restless  0     Suicidal thoughts  0     PHQ-9 Score  0     Difficult doing work/chores  Not difficult at all       Fall Risk Screen: see questionnaire below.    04/10/2023    9:44 AM 01/23/2023   12:02 PM 03/16/2022   10:03 AM 03/15/2021    9:33 AM 03/14/2020    9:46 AM  Fall Risk   Falls in the past year? 0 0 0 0 0  Number falls in past yr: 0 0 0 0 0  Injury with Fall? 0 0 0 0   Risk for fall due to : No Fall Risks No Fall Risks Medication side effect No Fall Risks   Follow up Falls evaluation completed Falls evaluation completed Falls evaluation completed;Education provided;Falls  prevention discussed      ADL screen:  See questionnaire below Functional Status Survey: Is the patient deaf or have difficulty hearing?: No Does the patient have difficulty seeing, even when wearing glasses/contacts?: No Does the patient have difficulty concentrating, remembering, or making decisions?: No Does the patient have difficulty walking or climbing stairs?: No Does the patient have difficulty dressing or bathing?: No Does the patient have difficulty doing errands alone such as visiting a doctor's office or shopping?: No   Review of Systems Constitutional: -, -unexpected weight change, -anorexia, -fatigue Allergy: -sneezing, -itching, -congestion Dermatology: denies  changing moles, rash, lumps ENT: -runny nose, -ear pain, -sore throat,  Cardiology:  -chest pain, -palpitations, -orthopnea, Respiratory: -cough, -shortness of breath, -dyspnea on exertion, -wheezing,  Gastroenterology: -abdominal pain, -nausea, -vomiting, -diarrhea, -constipation, -dysphagia Hematology: -bleeding or bruising problems Musculoskeletal: -arthralgias, -myalgias, -joint swelling, -back pain, - Ophthalmology: -vision changes,  Urology: -dysuria, -difficulty urinating,  -urinary frequency, -urgency, incontinence Neurology: -, -numbness, , -memory loss, -falls, -dizziness    PHYSICAL EXAM:  BP 138/70   Pulse 66   Temp 97.8 F (36.6 C) (Oral)   Resp 16   Ht 5' 2.25" (1.581 m)   Wt 215 lb 9.6 oz (97.8 kg)   SpO2 98% Comment: room air  BMI 39.12 kg/m   General Appearance: Alert, cooperative, no distress, appears stated age Head: Normocephalic, without obvious abnormality, atraumatic Eyes: PERRL, conjunctiva/corneas clear, EOM's intact, Ears: Normal TM's and external ear canals Nose: Nares normal, mucosa normal, no drainage or sinus tenderness Throat: Lips, mucosa, and tongue normal; teeth and gums normal Neck: Supple, no lymphadenopathy;  thyroid:  no enlargement/tenderness/nodules; no carotid bruit or JVD Lungs: Clear to auscultation bilaterally without wheezes, rales or ronchi; respirations unlabored Heart: Regular rate and rhythm, S1 and S2 normal, no murmur, rubor gallop Abdomen: Soft, non-tender, nondistended, normoactive bowel sounds,  no masses, no hepatosplenomegaly Skin:  Skin color, texture, turgor normal, no rashes or lesions Lymph nodes: Cervical, supraclavicular, and axillary nodes normal Neurologic:  CNII-XII intact, normal strength, sensation and gait; reflexes 2+ and symmetric throughout Psych: Normal mood, affect, hygiene and grooming.  ASSESSMENT/PLAN: Routine general medical examination at a health care facility  Hypertension associated with  diabetes (HCC) - Plan: CBC with Differential/Platelet, Comprehensive metabolic panel, lisinopril-hydrochlorothiazide (ZESTORETIC) 20-12.5 MG tablet  Seasonal allergic rhinitis due to pollen  Type 2 diabetes mellitus with morbid obesity (HCC) - Plan: CBC with Differential/Platelet, Comprehensive metabolic panel, Lipid panel, metFORMIN (GLUCOPHAGE) 500 MG tablet  Hyperlipidemia associated with type 2 diabetes mellitus (HCC) - Plan: Lipid panel, atorvastatin (LIPITOR) 10 MG tablet  Controlled type 2 diabetes mellitus with complication, without long-term current use of insulin (HCC) - Plan: CBC with Differential/Platelet, Comprehensive metabolic panel, Lipid panel, POCT glycosylated hemoglobin (Hb A1C), POCT UA - Microalbumin, pioglitazone (ACTOS) 30 MG tablet  Osteopenia, unspecified location  Glaucoma, unspecified glaucoma type, unspecified laterality  Depression, major, in remission (HCC)  Status post cataract extraction, unspecified laterality  Eczema, unspecified type    Discussed  weight-bearing exercise 2x/week; reviewed; healthy diet, including goals of calcium and vitamin D intake Immunization recommendations discussed.  Colonoscopy recommendations reviewed.  Recommend using the pioglitazone more regularly for the skin problem to keep it under control.   Medicare Attestation I have personally reviewed: The patient's medical and social history Their use of alcohol, tobacco or illicit drugs Their current medications and supplements The patient's functional ability including ADLs,fall risks, home safety risks, cognitive, and hearing and visual impairment Diet and physical activities  Evidence for depression or mood disorders  The patient's weight, height, and BMI have been recorded in the chart.  I have made referrals, counseling, and provided education to the patient based on review of the above and I have provided the patient with a written personalized care plan for preventive  services.     Sharlot Gowda, MD   04/10/2023

## 2023-04-24 DIAGNOSIS — L259 Unspecified contact dermatitis, unspecified cause: Secondary | ICD-10-CM | POA: Diagnosis not present

## 2023-04-24 DIAGNOSIS — L309 Dermatitis, unspecified: Secondary | ICD-10-CM | POA: Diagnosis not present

## 2023-04-30 ENCOUNTER — Other Ambulatory Visit: Payer: Self-pay | Admitting: Family Medicine

## 2023-04-30 DIAGNOSIS — E1169 Type 2 diabetes mellitus with other specified complication: Secondary | ICD-10-CM

## 2023-05-22 DIAGNOSIS — L218 Other seborrheic dermatitis: Secondary | ICD-10-CM | POA: Diagnosis not present

## 2023-08-09 ENCOUNTER — Other Ambulatory Visit: Payer: Medicare Other

## 2023-08-22 DIAGNOSIS — L218 Other seborrheic dermatitis: Secondary | ICD-10-CM | POA: Diagnosis not present

## 2023-09-04 ENCOUNTER — Encounter: Payer: Self-pay | Admitting: Psychiatry

## 2023-10-14 DIAGNOSIS — Z1231 Encounter for screening mammogram for malignant neoplasm of breast: Secondary | ICD-10-CM | POA: Diagnosis not present

## 2023-10-14 LAB — HM MAMMOGRAPHY

## 2023-10-15 ENCOUNTER — Encounter: Payer: Self-pay | Admitting: Family Medicine

## 2023-10-29 DIAGNOSIS — H40033 Anatomical narrow angle, bilateral: Secondary | ICD-10-CM | POA: Diagnosis not present

## 2023-10-29 DIAGNOSIS — H43813 Vitreous degeneration, bilateral: Secondary | ICD-10-CM | POA: Diagnosis not present

## 2023-10-29 DIAGNOSIS — E119 Type 2 diabetes mellitus without complications: Secondary | ICD-10-CM | POA: Diagnosis not present

## 2023-10-29 DIAGNOSIS — Z961 Presence of intraocular lens: Secondary | ICD-10-CM | POA: Diagnosis not present

## 2023-10-29 DIAGNOSIS — H35372 Puckering of macula, left eye: Secondary | ICD-10-CM | POA: Diagnosis not present

## 2023-10-29 DIAGNOSIS — H26493 Other secondary cataract, bilateral: Secondary | ICD-10-CM | POA: Diagnosis not present

## 2023-10-29 LAB — HM DIABETES EYE EXAM

## 2023-11-27 DIAGNOSIS — D225 Melanocytic nevi of trunk: Secondary | ICD-10-CM | POA: Diagnosis not present

## 2023-11-27 DIAGNOSIS — L814 Other melanin hyperpigmentation: Secondary | ICD-10-CM | POA: Diagnosis not present

## 2023-11-27 DIAGNOSIS — Z85828 Personal history of other malignant neoplasm of skin: Secondary | ICD-10-CM | POA: Diagnosis not present

## 2023-11-27 DIAGNOSIS — L821 Other seborrheic keratosis: Secondary | ICD-10-CM | POA: Diagnosis not present

## 2023-11-27 DIAGNOSIS — L84 Corns and callosities: Secondary | ICD-10-CM | POA: Diagnosis not present

## 2023-11-27 DIAGNOSIS — Z08 Encounter for follow-up examination after completed treatment for malignant neoplasm: Secondary | ICD-10-CM | POA: Diagnosis not present

## 2023-11-27 DIAGNOSIS — L304 Erythema intertrigo: Secondary | ICD-10-CM | POA: Diagnosis not present

## 2023-11-27 DIAGNOSIS — L570 Actinic keratosis: Secondary | ICD-10-CM | POA: Diagnosis not present

## 2023-12-06 ENCOUNTER — Other Ambulatory Visit: Payer: Self-pay | Admitting: Family Medicine

## 2023-12-15 ENCOUNTER — Other Ambulatory Visit: Payer: Self-pay | Admitting: Family Medicine

## 2023-12-15 DIAGNOSIS — E118 Type 2 diabetes mellitus with unspecified complications: Secondary | ICD-10-CM

## 2023-12-17 ENCOUNTER — Encounter: Payer: Self-pay | Admitting: Internal Medicine

## 2023-12-26 ENCOUNTER — Encounter (INDEPENDENT_AMBULATORY_CARE_PROVIDER_SITE_OTHER): Payer: Medicare Other | Admitting: Ophthalmology

## 2023-12-26 DIAGNOSIS — H43813 Vitreous degeneration, bilateral: Secondary | ICD-10-CM

## 2023-12-26 DIAGNOSIS — H353111 Nonexudative age-related macular degeneration, right eye, early dry stage: Secondary | ICD-10-CM

## 2023-12-26 DIAGNOSIS — H35033 Hypertensive retinopathy, bilateral: Secondary | ICD-10-CM | POA: Diagnosis not present

## 2023-12-26 DIAGNOSIS — I10 Essential (primary) hypertension: Secondary | ICD-10-CM

## 2023-12-26 DIAGNOSIS — H26493 Other secondary cataract, bilateral: Secondary | ICD-10-CM

## 2023-12-26 DIAGNOSIS — H35372 Puckering of macula, left eye: Secondary | ICD-10-CM | POA: Diagnosis not present

## 2024-01-30 ENCOUNTER — Ambulatory Visit
Admission: RE | Admit: 2024-01-30 | Discharge: 2024-01-30 | Disposition: A | Discharge status: Home / Self Care | Payer: Medicare Other | Source: Ambulatory Visit | Attending: Family Medicine | Admitting: Family Medicine

## 2024-01-30 DIAGNOSIS — M810 Age-related osteoporosis without current pathological fracture: Secondary | ICD-10-CM | POA: Diagnosis not present

## 2024-02-05 ENCOUNTER — Encounter: Payer: Self-pay | Admitting: Family Medicine

## 2024-02-25 ENCOUNTER — Telehealth: Payer: Self-pay | Admitting: Family Medicine

## 2024-02-25 NOTE — Telephone Encounter (Signed)
 We received fax from [facility] for diabetic supplies. Spoke to patient she uses her local pharmacy/[facility] for her diabetic supplies Faxed back request as denied

## 2024-03-02 ENCOUNTER — Ambulatory Visit: Payer: Self-pay

## 2024-03-02 NOTE — Telephone Encounter (Signed)
 Copied from CRM [id number]. Topic: Clinical - Red Word Triage >> Mar 02, 2024 11:46 AM Everette C wrote: [facility] that prompted transfer to Nurse Triage: The patient has had swelling in their right leg for one week but it's now increased in their foot noticeably   Chief Complaint: Leg swelling  Symptoms: Right leg swelling  Frequency: Constant  Pertinent Negatives: Patient denies calf pain, shortness of breath, chest pain  Disposition: [] ED /[] Urgent Care (no appt availability in office) / [x] Appointment(In office/virtual)/ []  [facility]/ [] Home Care/ [] Refused Recommended Disposition /[] [place] [facility]/ []  Follow-up with PCP Additional Notes: Patient reports that she has had right leg swelling that began 1 week ago. She states the swelling is up to her knee and states that her right foot has become more swollen over the last day or so. She denies any chest pain, shortness of breath, or calf pain. Appointment made for the patient tomorrow for evaluation.     Reason for Disposition  [1] MODERATE leg swelling (e.g., swelling extends up to knees) AND [2] new-onset or worsening  Answer Assessment - Initial Assessment Questions 1. ONSET: "When did the swelling start?" (e.g., minutes, hours, days)     1 week 2. LOCATION: "What part of the leg is swollen?"  "Are both legs swollen or just one leg?"     Right leg  3. SEVERITY: "How bad is the swelling?" (e.g., localized; mild, moderate, severe)   - Localized: Small area of swelling localized to one leg.   - MILD pedal edema: Swelling limited to foot and ankle, pitting edema < 1/4 inch (6 mm) deep, rest and elevation eliminate most or all swelling.   - MODERATE edema: Swelling of lower leg to knee, pitting edema > 1/4 inch (6 mm) deep, rest and elevation only partially reduce swelling.   - SEVERE edema: Swelling extends above knee, facial or hand swelling present.      Moderate, up to knee  4. REDNESS: "Does the swelling look red  or infected?"     No 5. PAIN: "Is the swelling painful to touch?" If Yes, ask: "How painful is it?"   (Scale 1-10; mild, moderate or severe)     No pain 6. FEVER: "Do you have a fever?" If Yes, ask: "What is it, how was it measured, and when did it start?"      No 7. CAUSE: "What do you think is causing the leg swelling?"     Unsure  8. MEDICAL HISTORY: "Do you have a history of blood clots (e.g., DVT), cancer, heart failure, kidney disease, or liver failure?"     No 9. RECURRENT SYMPTOM: "Have you had leg swelling before?" If Yes, ask: "When was the last time?" "What happened that time?"     Nothing that's lasted this long  10. OTHER SYMPTOMS: "Do you have any other symptoms?" (e.g., chest pain, difficulty breathing)       No  Protocols used: Leg Swelling and Edema-A-AH

## 2024-03-03 ENCOUNTER — Ambulatory Visit (INDEPENDENT_AMBULATORY_CARE_PROVIDER_SITE_OTHER): Admitting: Family Medicine

## 2024-03-03 ENCOUNTER — Encounter: Payer: Self-pay | Admitting: Family Medicine

## 2024-03-03 VITALS — BP 122/80 | HR 94 | Wt 221.0 lb

## 2024-03-03 DIAGNOSIS — R609 Edema, unspecified: Secondary | ICD-10-CM | POA: Diagnosis not present

## 2024-03-03 NOTE — Progress Notes (Signed)
   Subjective:    Patient ID: [name], female    DOB: 09-06-1951, 73 y.o.   MRN: [id number]  HPI She complains of a several day history of right leg swelling.  She has been sitting a little bit more than normal recently but no chest pain, shortness of breath, recent injury to the leg.   Review of Systems     Objective:    Physical Exam Alert and in no distress.  Celine Collard' sign is negative.  The skin appears normal as well as not warm.  1+ pitting edema is noted.       Assessment & Plan:  Dependent edema I explained that this is most likely physiologic she is to dangling of her feet while sitting.  Recommend she elevate her feet is much as possible and use support stockings on an as-needed basis.  She was comfortable with that.

## 2024-03-03 NOTE — Patient Instructions (Signed)
 Elevate your legs is much as possible and you can use support hose

## 2024-03-04 ENCOUNTER — Telehealth: Payer: Self-pay | Admitting: Family Medicine

## 2024-03-04 NOTE — Telephone Encounter (Signed)
 We received fax from [facility] for diabetic supplies. Spoke to patient she uses her local pharmacy/[facility] for her diabetic supplies Faxed back request as denied

## 2024-03-05 DIAGNOSIS — E119 Type 2 diabetes mellitus without complications: Secondary | ICD-10-CM | POA: Diagnosis not present

## 2024-03-10 ENCOUNTER — Other Ambulatory Visit: Payer: Self-pay | Admitting: Medical

## 2024-03-10 DIAGNOSIS — E1169 Type 2 diabetes mellitus with other specified complication: Secondary | ICD-10-CM

## 2024-04-03 ENCOUNTER — Other Ambulatory Visit: Payer: Self-pay | Admitting: Family Medicine

## 2024-04-03 DIAGNOSIS — I152 Hypertension secondary to endocrine disorders: Secondary | ICD-10-CM

## 2024-04-03 MED ORDER — LISINOPRIL-HYDROCHLOROTHIAZIDE 20-12.5 MG PO TABS: 1.0000 | ORAL_TABLET | Freq: Every day | ORAL | 1 refills | Status: DC

## 2024-04-03 NOTE — Telephone Encounter (Signed)
 Copied from CRM [id number]. Topic: Clinical - Medication Refill >> Apr 03, 2024 10:49 AM Carlatta H wrote: Medication: lisinopril -hydrochlorothiazide  (ZESTORETIC ) 20-12.5 MG tablet  Has the patient contacted their pharmacy? No (Agent: If no, request that the patient contact the pharmacy for the refill. If patient does not wish to contact the pharmacy document the reason why and proceed with request.) (Agent: If yes, when and what did the pharmacy advise?)  This is the patient's preferred pharmacy:  [id number] Jonette Nestle, [place] - [address] AT [facility] OF [facility] & [address] [place] [address] Phone: [phone] Fax: [phone]    Is this the correct pharmacy for this prescription? Yes If no, delete pharmacy and type the correct one.   Has the prescription been filled recently? No  Is the patient out of the medication? No  Has the patient been seen for an appointment in the last year OR does the patient have an upcoming appointment? Yes  Can we respond through MyChart? Yes  Agent: Please be advised that Rx refills may take up to 3 business days. We ask that you follow-up with your pharmacy.

## 2024-04-22 ENCOUNTER — Ambulatory Visit: Payer: Medicare Other | Admitting: Family Medicine

## 2024-04-22 ENCOUNTER — Encounter: Payer: Self-pay | Admitting: Family Medicine

## 2024-04-22 DIAGNOSIS — E1159 Type 2 diabetes mellitus with other circulatory complications: Secondary | ICD-10-CM | POA: Diagnosis not present

## 2024-04-22 DIAGNOSIS — Z23 Encounter for immunization: Secondary | ICD-10-CM | POA: Diagnosis not present

## 2024-04-22 DIAGNOSIS — Z9849 Cataract extraction status, unspecified eye: Secondary | ICD-10-CM

## 2024-04-22 DIAGNOSIS — E118 Type 2 diabetes mellitus with unspecified complications: Secondary | ICD-10-CM | POA: Diagnosis not present

## 2024-04-22 DIAGNOSIS — M8580 Other specified disorders of bone density and structure, unspecified site: Secondary | ICD-10-CM

## 2024-04-22 DIAGNOSIS — Z0000 Encounter for general adult medical examination without abnormal findings: Secondary | ICD-10-CM

## 2024-04-22 DIAGNOSIS — E785 Hyperlipidemia, unspecified: Secondary | ICD-10-CM | POA: Diagnosis not present

## 2024-04-22 DIAGNOSIS — H402234 Chronic angle-closure glaucoma, bilateral, indeterminate stage: Secondary | ICD-10-CM | POA: Diagnosis not present

## 2024-04-22 DIAGNOSIS — E088 Diabetes mellitus due to underlying condition with unspecified complications: Secondary | ICD-10-CM

## 2024-04-22 DIAGNOSIS — J301 Allergic rhinitis due to pollen: Secondary | ICD-10-CM | POA: Diagnosis not present

## 2024-04-22 DIAGNOSIS — I152 Hypertension secondary to endocrine disorders: Secondary | ICD-10-CM | POA: Diagnosis not present

## 2024-04-22 DIAGNOSIS — E1169 Type 2 diabetes mellitus with other specified complication: Secondary | ICD-10-CM | POA: Diagnosis not present

## 2024-04-22 DIAGNOSIS — F325 Major depressive disorder, single episode, in full remission: Secondary | ICD-10-CM

## 2024-04-22 LAB — POCT UA - MICROALBUMIN
Albumin/Creatinine Ratio, Urine, POC: 32.3
Creatinine, POC: 177.6 mg/dL
Microalbumin Ur, POC: 18.2 mg/L

## 2024-04-22 LAB — POCT GLYCOSYLATED HEMOGLOBIN (HGB A1C): Hemoglobin A1C: 7.5 % — AB (ref 4.0–5.6)

## 2024-04-22 LAB — LIPID PANEL

## 2024-04-22 MED ORDER — PIOGLITAZONE HCL 30 MG PO TABS: ORAL_TABLET | ORAL | 1 refills | Status: AC

## 2024-04-22 MED ORDER — ATORVASTATIN CALCIUM 10 MG PO TABS
ORAL_TABLET | ORAL | 3 refills | Status: DC
Start: 2024-04-22 — End: 2024-07-06

## 2024-04-22 MED ORDER — LISINOPRIL-HYDROCHLOROTHIAZIDE 20-12.5 MG PO TABS: 1.0000 | ORAL_TABLET | Freq: Every day | ORAL | 3 refills | Status: AC

## 2024-04-22 MED ORDER — METFORMIN HCL 500 MG PO TABS: ORAL_TABLET | ORAL | 1 refills | Status: AC

## 2024-04-22 NOTE — Patient Instructions (Signed)
  Ms. [name] , Thank you for taking time to come for your Medicare Wellness Visit. I appreciate your ongoing commitment to your health goals. Please review the following plan we discussed and let me know if I can assist you in the future.   These are the goals we discussed:  Goals      Patient Stated     03/16/2022, wants to be more active        This is a list of the screening recommended for you and due dates:  Health Maintenance  Topic Date Due   COVID-19 Vaccine (7 - 2024-25 season) 06/23/2023   Hemoglobin A1C  10/10/2023   Yearly kidney function blood test for diabetes  04/09/2024   Yearly kidney health urinalysis for diabetes  04/09/2024   Medicare Annual Wellness Visit  04/09/2024   Flu Shot  05/22/2024   Eye exam for diabetics  10/28/2024   Complete foot exam   04/22/2025   Mammogram  10/13/2025   DTaP/Tdap/Td vaccine (3 - Td or Tdap) 05/02/2027   Colon Cancer Screening  12/10/2029   DEXA scan (bone density measurement)  Completed   Hepatitis C Screening  Completed   Zoster (Shingles) Vaccine  Completed   Hepatitis B Vaccine  Aged Out   HPV Vaccine  Aged Out   Meningitis B Vaccine  Aged Out   Pneumococcal Vaccine for age over 59  Discontinued

## 2024-04-22 NOTE — Progress Notes (Signed)
 [name] is a 73 y.o. female who presents for annual wellness visit,CPE and follow-up on chronic medical conditions.  She has no particular concerns or complaints.  She does follow-up with ophthalmology for her glaucoma and has had cataract extraction.  She is also seeing dermatology for general skin care.  She is no longer depressed and seeing the wound quite nicely psychologically.  She does have a history of osteopenia and is taking extra vitamin D  and calcium .  Continues on her lisinopril /HCTZ as well as metformin , pioglitazone  and and Lipitor without difficulty.  Eczema is causing no difficulty.  Her allergies are under good control.  Her home life is quite stable.  She is helping to take care of her husband who is having some pressure related problems with the foot.  Immunizations and Health Maintenance Immunization History  Administered Date(s) Administered   Fluad Quad(high Dose 65+) 09/14/2019, 09/08/2020, 09/18/2021, 08/16/2022   Influenza Split 06/30/2012, 08/02/2013   Influenza Whole 06/27/2011   Influenza, High Dose Seasonal PF 09/13/2015, 08/28/2016, 07/01/2017, 09/11/2018   Influenza,inj,Quad PF,6+ Mos 06/10/2014   PFIZER Comirnaty(Cindy Top)Covid-19 Tri-Sucrose Vaccine 03/15/2021   PFIZER(Purple Top)SARS-COV-2 Vaccination 12/19/2019, 01/09/2020, 08/20/2020   Pfizer Covid-19 Vaccine Bivalent Booster 67yrs & up 09/18/2021   Pfizer(Comirnaty)Fall Seasonal Vaccine 12 years and older 08/16/2022   Pneumococcal Conjugate-13 09/07/2014, 05/01/2017   Pneumococcal Polysaccharide-23 02/28/2012   Tdap 01/23/2007, 05/01/2017   Zoster Recombinant(Shingrix) 08/01/2017, 12/19/2017   Zoster, Live 01/23/2007   Health Maintenance Due  Topic Date Due   COVID-19 Vaccine (7 - 2024-25 season) 06/23/2023   HEMOGLOBIN A1C  10/10/2023   Diabetic kidney evaluation - eGFR measurement  04/09/2024   Diabetic kidney evaluation - Urine ACR  04/09/2024   Medicare Annual Wellness (AWV)  04/09/2024     Last Pap smear:N/A Last mammogram: 10/14/2023 Last colonoscopy: 01/04/2014 Last DEXA: 02/05/2024 Dentist: within the last year  Ophtho: within the last year Exercise: does not participate in exercise.  Other doctors caring for patient include:Cindy  [name]  Advanced directives: Does Patient Have a Medical Advance Directive?: Yes Type of Advance Directive: Healthcare Power of Attorney, Living will, Out of facility DNR (pink MOST or yellow form) Does patient want to make changes to medical advance directive?: Yes (Inpatient - patient defers changing a medical advance directive and declines information at this time) Copy of Healthcare Power of Attorney in Chart?: Yes - validated most recent copy scanned in chart (See row information)  Depression screen:  See questionnaire below.     04/22/2024    9:09 AM 04/10/2023    9:45 AM 03/16/2022   10:03 AM 03/15/2021    9:36 AM 03/14/2020    9:46 AM  Depression screen PHQ 2/9  Decreased Interest 0 0 0 0 0  Down, Depressed, Hopeless 0 0 0 0 0  PHQ - 2 Score 0 0 0 0 0  Altered sleeping   0    Tired, decreased energy   0    Change in appetite   0    Feeling bad or failure about yourself    0    Trouble concentrating   0    Moving slowly or fidgety/restless   0    Suicidal thoughts   0    PHQ-9 Score   0    Difficult doing work/chores   Not difficult at all      Fall Risk Screen: see questionnaire below.    04/22/2024    9:08 AM 04/10/2023    9:44 AM  01/23/2023   12:02 PM 03/16/2022   10:03 AM 03/15/2021    9:33 AM  Fall Risk   Falls in the past year? 0 0 0 0 0  Number falls in past yr: 0 0 0 0 0  Injury with Fall? 0 0 0 0 0  Risk for fall due to : No Fall Risks No Fall Risks No Fall Risks Medication side effect No Fall Risks  Follow up Falls evaluation completed Falls evaluation completed Falls evaluation completed Falls evaluation completed;Education provided;Falls prevention discussed       Data saved with a previous flowsheet row  definition    ADL screen:  See questionnaire below Functional Status Survey: Is the patient deaf or have difficulty hearing?: No Does the patient have difficulty seeing, even when wearing glasses/contacts?: No Does the patient have difficulty concentrating, remembering, or making decisions?: No Does the patient have difficulty walking or climbing stairs?: No Does the patient have difficulty dressing or bathing?: No Does the patient have difficulty doing errands alone such as visiting a doctor's office or shopping?: No   Review of Systems Constitutional: -, -unexpected weight change, -anorexia, -fatigue Allergy: -sneezing, -itching, -congestion Dermatology: denies changing moles, rash, lumps ENT: -runny nose, -ear pain, -sore throat,  Cardiology:  -chest pain, -palpitations, -orthopnea, Respiratory: -cough, -shortness of breath, -dyspnea on exertion, -wheezing,  Gastroenterology: -abdominal pain, -nausea, -vomiting, -diarrhea, -constipation, -dysphagia Hematology: -bleeding or bruising problems Musculoskeletal: -arthralgias, -myalgias, -joint swelling, -back pain, - Ophthalmology: -vision changes,  Urology: -dysuria, -difficulty urinating,  -urinary frequency, -urgency, incontinence Neurology: -, -numbness, , -memory loss, -falls, -dizziness    PHYSICAL EXAM:  General Appearance: Alert, cooperative, no distress, appears stated age Head: Normocephalic, without obvious abnormality, atraumatic Eyes: PERRL, conjunctiva/corneas clear, EOM's intact,  Ears: Normal TM's and external ear canals Nose: Nares normal, mucosa normal, no drainage or sinus tenderness Throat: Lips, mucosa, and tongue normal; teeth and gums normal Neck: Supple, no lymphadenopathy;  thyroid :  no enlargement/tenderness/nodules; no carotid bruit or JVD Lungs: Clear to auscultation bilaterally without wheezes, rales or ronchi; respirations unlabored Heart: Regular rate and rhythm, S1 and S2 normal, no murmur, rubor  gallop Abdomen: Soft, non-tender, nondistended, normoactive bowel sounds,  no masses, no hepatosplenomegaly Extremities: No clubbing, cyanosis or edema  foot exam isnormal Pulses: 2+ and symmetric all extremities Skin:  Skin color, texture, turgor normal, no rashes or lesions Lymph nodes: Cervical, supraclavicular, and axillary nodes normal Neurologic:  CNII-XII intact, normal strength, sensation and gait; reflexes 2+ and symmetric throughout Psych: Normal mood, affect, hygiene and grooming. Hemoglobin A1c is 7.5 ASSESSMENT/PLAN: Routine general medical examination at a health care facility  Status post cataract extraction, unspecified laterality  Osteopenia, unspecified location - Plan: CBC with Differential/Platelet, Comprehensive metabolic panel with GFR  Hypertension associated with diabetes (HCC) - Plan: CBC with Differential/Platelet, Comprehensive metabolic panel with GFR, lisinopril -hydrochlorothiazide  (ZESTORETIC ) 20-12.5 MG tablet  Hyperlipidemia associated with type 2 diabetes mellitus (HCC) - Plan: Lipid panel, atorvastatin  (LIPITOR) 10 MG tablet  Bilateral chronic primary angle-closure glaucoma, indeterminate stage  Depression, major, in remission (HCC)  Seasonal allergic rhinitis due to pollen  Need for vaccination against Streptococcus pneumoniae - Plan: Pneumococcal conjugate vaccine 20-valent (Prevnar 20)  Controlled type 2 diabetes mellitus with complication, without long-term current use of insulin  (HCC) - Plan: POCT UA - Microalbumin, pioglitazone  (ACTOS ) 30 MG tablet    Discussed  at least 30 minutes of aerobic activity at least 5 days/week and weight-bearing exercise 2x/week;  healthy diet, including goals of calcium  and  vitamin D  intake Immunization recommendations discussed.  Colonoscopy recommendations reviewed   Medicare Attestation I have personally reviewed: The patient's medical and social history Their use of alcohol, tobacco or illicit  drugs Their current medications and supplements The patient's functional ability including ADLs,fall risks, home safety risks, cognitive, and hearing and visual impairment Diet and physical activities Evidence for depression or mood disorders  The patient's weight, height, and BMI have been recorded in the chart.  I have made referrals, counseling, and provided education to the patient based on review of the above and I have provided the patient with a written personalized care plan for preventive services.     Norleen Jobs, MD   04/22/2024

## 2024-04-23 ENCOUNTER — Ambulatory Visit: Payer: Self-pay | Admitting: Family Medicine

## 2024-04-23 LAB — COMPREHENSIVE METABOLIC PANEL WITH GFR
ALT: 21 IU/L (ref 0–32)
AST: 19 IU/L (ref 0–40)
Albumin: 4.2 g/dL (ref 3.8–4.8)
Alkaline Phosphatase: 72 IU/L (ref 44–121)
BUN/Creatinine Ratio: 25 (ref 12–28)
BUN: 18 mg/dL (ref 8–27)
Bilirubin Total: 0.4 mg/dL (ref 0.0–1.2)
CO2: 21 mmol/L (ref 20–29)
Calcium: 9.4 mg/dL (ref 8.7–10.3)
Chloride: 103 mmol/L (ref 96–106)
Creatinine, Ser: 0.73 mg/dL (ref 0.57–1.00)
Globulin, Total: 2.3 g/dL (ref 1.5–4.5)
Glucose: 149 mg/dL — AB (ref 70–99)
Potassium: 3.9 mmol/L (ref 3.5–5.2)
Sodium: 142 mmol/L (ref 134–144)
Total Protein: 6.5 g/dL (ref 6.0–8.5)
eGFR: 86 mL/min/{1.73_m2} (ref >59)

## 2024-04-23 LAB — LIPID PANEL
Cholesterol, Total: 129 mg/dL (ref 100–199)
HDL: 47 mg/dL (ref >39)
LDL CALC COMMENT:: 2.7 ratio (ref 0.0–4.4)
LDL Chol Calc (NIH): 62 mg/dL (ref 0–99)
Triglycerides: 108 mg/dL (ref 0–149)
VLDL Cholesterol Cal: 20 mg/dL (ref 5–40)

## 2024-04-23 LAB — CBC WITH DIFFERENTIAL/PLATELET
Basophils Absolute: 0 10*3/uL (ref 0.0–0.2)
Basos: 1 %
EOS (ABSOLUTE): 0.1 10*3/uL (ref 0.0–0.4)
Eos: 4 %
Hematocrit: 39.3 % (ref 34.0–46.6)
Hemoglobin: 12.4 g/dL (ref 11.1–15.9)
Immature Grans (Abs): 0 10*3/uL (ref 0.0–0.1)
Immature Granulocytes: 0 %
Lymphocytes Absolute: 0.8 10*3/uL (ref 0.7–3.1)
Lymphs: 28 %
MCH: 31.2 pg (ref 26.6–33.0)
MCHC: 31.6 g/dL (ref 31.5–35.7)
MCV: 99 fL — ABNORMAL HIGH (ref 79–97)
Monocytes Absolute: 0.3 10*3/uL (ref 0.1–0.9)
Monocytes: 10 %
Neutrophils Absolute: 1.6 10*3/uL (ref 1.4–7.0)
Neutrophils: 57 %
Platelets: 202 10*3/uL (ref 150–450)
RBC: 3.98 x10E6/uL (ref 3.77–5.28)
RDW: 12.6 % (ref 11.7–15.4)
WBC: 2.8 10*3/uL — ABNORMAL LOW (ref 3.4–10.8)

## 2024-07-04 ENCOUNTER — Other Ambulatory Visit: Payer: Self-pay | Admitting: Family Medicine

## 2024-07-04 DIAGNOSIS — E1169 Type 2 diabetes mellitus with other specified complication: Secondary | ICD-10-CM

## 2024-07-06 ENCOUNTER — Ambulatory Visit: Payer: Self-pay

## 2024-07-06 ENCOUNTER — Encounter (INDEPENDENT_AMBULATORY_CARE_PROVIDER_SITE_OTHER): Admitting: Ophthalmology

## 2024-07-06 DIAGNOSIS — H43813 Vitreous degeneration, bilateral: Secondary | ICD-10-CM | POA: Diagnosis not present

## 2024-07-06 DIAGNOSIS — G453 Amaurosis fugax: Secondary | ICD-10-CM

## 2024-07-06 DIAGNOSIS — I10 Essential (primary) hypertension: Secondary | ICD-10-CM

## 2024-07-06 DIAGNOSIS — H35372 Puckering of macula, left eye: Secondary | ICD-10-CM

## 2024-07-06 DIAGNOSIS — H35033 Hypertensive retinopathy, bilateral: Secondary | ICD-10-CM

## 2024-07-06 LAB — OPHTHALMOLOGY REPORT-SCANNED

## 2024-07-06 NOTE — Telephone Encounter (Signed)
 FYI Only or Action Required?: FYI only for provider.  Patient was last seen in primary care on 04/22/2024 by Joyce Norleen BROCKS, MD.  Called Nurse Triage reporting passed out.  Symptoms began yesterday.  Interventions attempted: Nothing.  Symptoms are: stable.  Triage Disposition: See HCP Within 24 Hours (Or PCP Triage)  Patient/caregiver understands and will follow disposition?: Yes      Copied from CRM [id number]. Topic: Clinical - Red Word Triage >> Jul 06, 2024  3:42 PM [name] wrote: Red Word that prompted transfer to Nurse Triage: Patient calling, reports she had a black out on Sunday, while using the restroom, states before she blacked out, felt like she was overheated.   Patient reports she was seen today to be seen at eye doctor, per eye doctor needs to be evaluated by PCP maybe something that needs to be treated by PCP. Reason for Disposition  [1] All other patients AND [2] now alert and feels fine  (Exception: SIMPLE FAINT due to stress, pain, prolonged standing, or suddenly standing)  Answer Assessment - Initial Assessment Questions 1. ONSET: How long were you unconscious? (e.g., minutes, seconds) When did it happen?     Sunday, states everything turned black and she was sitting on the toilet. 2. CONTENT: What happened during the period of unconsciousness? (e.g., seizure activity)      States was out in the heat yesterday, denies fall,  3. MENTAL STATUS: Alert and oriented now? (e.g., oriented x 3 = name, month, location)      denies 4. TRIGGER: What do you think caused the fainting? What were you doing just before you fainted?  (e.g., exercise, sudden standing up, prolonged standing)     unknown 5. RECURRENT SYMPTOM: Have you ever passed out before? If Yes, ask: When was the last time? and What happened that time?      denies 6. INJURY: Did you hurt yourself when you fell?      denies 7. CARDIAC SYMPTOMS: Have you had any of the following symptoms:  chest pain, difficulty breathing, palpitations?     denies 8. NEUROLOGIC SYMPTOMS: Have you had any of the following symptoms: headache, numbness, vertigo, weakness?     denies 9. GI SYMPTOMS: Have you had any of the following symptoms: abdomen pain, vomiting, diarrhea, blood in stools?     no 10. OTHER SYMPTOMS: Do you have any other symptoms?       no 11. PREGNANCY: Is there any chance you are pregnant? When was your last menstrual period?       na  Protocols used: Fainting-A-AH

## 2024-07-07 ENCOUNTER — Ambulatory Visit (INDEPENDENT_AMBULATORY_CARE_PROVIDER_SITE_OTHER): Admitting: Family Medicine

## 2024-07-07 ENCOUNTER — Encounter: Payer: Self-pay | Admitting: Family Medicine

## 2024-07-07 VITALS — BP 110/68 | HR 76 | Ht 64.0 in | Wt 215.8 lb

## 2024-07-07 DIAGNOSIS — R42 Dizziness and giddiness: Secondary | ICD-10-CM

## 2024-07-07 DIAGNOSIS — E118 Type 2 diabetes mellitus with unspecified complications: Secondary | ICD-10-CM | POA: Diagnosis not present

## 2024-07-07 DIAGNOSIS — R55 Syncope and collapse: Secondary | ICD-10-CM

## 2024-07-07 MED ORDER — ONETOUCH ULTRASOFT LANCETS MISC: 0 refills | Status: DC

## 2024-07-07 MED ORDER — ONETOUCH ULTRASOFT LANCETS MISC: 0 refills | Status: AC

## 2024-07-07 MED ORDER — ONETOUCH ULTRA VI STRP: ORAL_STRIP | 3 refills | Status: DC

## 2024-07-07 NOTE — Progress Notes (Signed)
   Subjective:    Patient ID: [name], female    DOB: July 23, 1951, 73 y.o.   MRN: [id number]  Discussed the use of AI scribe software for clinical note transcription with the patient, who gave verbal consent to proceed.  History of Present Illness   [name] is a 73 year old female who presents with a brief episode of vision loss.  On Sunday, she experienced a sudden episode of vision loss while sitting on the toilet at approximately 5 PM. The event was described as everything going black for about five to ten seconds. She did not lose consciousness or fall during this episode.  Prior to the vision loss, she had been out in the heat and was feeling overheated and sweaty. She had just returned home from church and was unloading groceries from her car. She was not straining or exerting herself at the time of the vision loss.  During the episode, she did not experience nausea, vomiting, or significant dizziness, although she felt a little dizzy afterwards. No other symptoms such as weakness or sweating occurred during the vision loss.  Her vision returned gradually, starting centrally and then expanding to normal. She was able to resume her activities without significant difficulty after the episode.   She also did see her ophthalmologist and that note was reviewed.  Difficult to read due to being handwritten. She also needs some for diabetes supplies redone.      Review of Systems     Objective:    Physical Exam Alert and in no distress.  Cardiac exam shows regular rhythm without murmurs or gallops.  EOMI.             Assessment & Plan:  Assessment and Plan    Transient vision loss..  It appears to be mostly post micturition vagal response but no syncope. Vision loss likely due to vasovagal or heat-related  - Advised to avoid overheating and ensure adequate hydration. - Consider further evaluation if symptoms persist or worsen.  Type 2 diabetes mellitus Diabetes  managed with regular retina specialist follow-ups. - Continue regular follow-ups with retina specialist. - Maintain current diabetes management plan.

## 2024-07-10 ENCOUNTER — Telehealth: Payer: Self-pay

## 2024-07-10 ENCOUNTER — Other Ambulatory Visit: Payer: Self-pay | Admitting: Internal Medicine

## 2024-07-10 DIAGNOSIS — E118 Type 2 diabetes mellitus with unspecified complications: Secondary | ICD-10-CM

## 2024-07-10 MED ORDER — BLOOD GLUCOSE TEST VI STRP: ORAL_STRIP | 0 refills | Status: AC

## 2024-07-10 MED ORDER — LANCETS MISC. MISC: 0 refills | Status: AC

## 2024-07-10 MED ORDER — LANCET DEVICE MISC: 0 refills | Status: AC

## 2024-07-10 MED ORDER — BLOOD GLUCOSE MONITORING SUPPL DEVI: 0 refills | Status: AC

## 2024-07-10 MED ORDER — LANCETS MISC. MISC
0 refills | Status: DC
Start: 2024-07-10 — End: 2024-07-10

## 2024-07-10 MED ORDER — BLOOD GLUCOSE MONITORING SUPPL DEVI
0 refills | Status: DC
Start: 2024-07-10 — End: 2024-07-10

## 2024-07-10 MED ORDER — LANCET DEVICE MISC
0 refills | Status: DC
Start: 2024-07-10 — End: 2024-07-10

## 2024-07-10 MED ORDER — BLOOD GLUCOSE TEST VI STRP: ORAL_STRIP | 0 refills | Status: DC

## 2024-07-10 NOTE — Telephone Encounter (Signed)
 Sent in new order pending insurance

## 2024-07-10 NOTE — Addendum Note (Signed)
 Addended by: VICCI HUSBAND A on: 07/10/2024 09:58 AM   Modules accepted: Orders

## 2024-07-10 NOTE — Telephone Encounter (Signed)
 Copied from CRM [id number]. Topic: Clinical - Prescription Issue >> Jul 10, 2024  9:36 AM Wess RAMAN wrote: Reason for CRM: Patient stated pharmacy no longer carries the One Touch Ultra reader and test strips and would like a new order with something similar.  Callback #: [id number]  Pharmacy:  [id number] [place], [place] - [address] AT [facility] [address] [place] [address] Phone: [phone] Fax: [phone] Hours: Not open 24 hours

## 2024-10-20 LAB — OPHTHALMOLOGY REPORT-SCANNED

## 2024-10-27 ENCOUNTER — Encounter: Payer: Self-pay | Admitting: Family Medicine

## 2024-10-27 ENCOUNTER — Ambulatory Visit (INDEPENDENT_AMBULATORY_CARE_PROVIDER_SITE_OTHER): Payer: Self-pay | Admitting: Family Medicine

## 2024-10-27 VITALS — BP 108/68 | HR 102 | Wt 216.0 lb

## 2024-10-27 DIAGNOSIS — E119 Type 2 diabetes mellitus without complications: Secondary | ICD-10-CM | POA: Diagnosis not present

## 2024-10-27 DIAGNOSIS — E1169 Type 2 diabetes mellitus with other specified complication: Secondary | ICD-10-CM

## 2024-10-27 DIAGNOSIS — Z7985 Long-term (current) use of injectable non-insulin antidiabetic drugs: Secondary | ICD-10-CM

## 2024-10-27 DIAGNOSIS — E118 Type 2 diabetes mellitus with unspecified complications: Secondary | ICD-10-CM

## 2024-10-27 LAB — POCT GLYCOSYLATED HEMOGLOBIN (HGB A1C): Hemoglobin A1C: 7.7 % — AB (ref 4.0–5.6)

## 2024-10-27 MED ORDER — TIRZEPATIDE 2.5 MG/0.5ML ~~LOC~~ SOAJ: 2.5000 mg | SUBCUTANEOUS | 1 refills | Status: AC

## 2024-10-27 NOTE — Progress Notes (Signed)
" ° °  Name: [name]   Date of Visit: 10/27/2024   Date of last visit with me: Visit date not found   CHIEF COMPLAINT:  Chief Complaint  Patient presents with   Follow-up    6 month follow up.        HPI:  Discussed the use of AI scribe software for clinical note transcription with the patient, who gave verbal consent to proceed.  History of Present Illness   [name] is a 74 year old female with diabetes who presents for follow-up of her A1c levels.  Her A1c level has increased from 6.6 to 7.7 over the past three years, with the most recent level being 7.7, slightly higher than the previous 7.5.  She is on a regimen of diabetes medications, though specific medications and dosages were not discussed.  She sometimes has a bowl of cereal for breakfast if hungry, but often just drinks a glass of milk and takes her pills.         OBJECTIVE:       04/22/2024    9:09 AM  Depression screen PHQ 2/9  Decreased Interest 0  Down, Depressed, Hopeless 0  PHQ - 2 Score 0     BP Readings from Last 3 Encounters:  10/27/24 108/68  07/07/24 110/68  03/03/24 122/80    BP 108/68   Pulse (!) 102   Wt 216 lb (98 kg)   SpO2 95%   BMI 37.08 kg/m    Physical Exam          Physical Exam Constitutional:      Appearance: Normal appearance.  Neurological:     General: No focal deficit present.     Mental Status: She is alert and oriented to person, place, and time. Mental status is at baseline.     ASSESSMENT/PLAN:   Assessment & Plan Controlled type 2 diabetes mellitus with complication, without long-term current use of insulin  (HCC)  Diabetes mellitus treated with injections of non-insulin  medication (HCC)    Assessment and Plan    Type 2 diabetes mellitus-uncontrolled A1c increased to 7.7, indicating a slow upward trend. Current management includes multiple medications. Considered GLP-1 receptor agonist Mounjaro  for weight loss and cardiovascular benefits.  Discussed side effects and mechanism of action. Insurance coverage confirmed. - Initiated Mounjaro  2.5 mg weekly. - Instructed to report after three weeks to assess tolerance and adjust dosage. - Planned dose escalation to 5 mg, 7.5 mg, potentially 10 mg, with 12.5 mg as maximum. - Sent prescription to [facility] on [address].         Navaya Wiatrek A. Vita MD [facility] "

## 2024-10-27 NOTE — Addendum Note (Signed)
 Addended by: LATTIE CARLO BROCKS on: 10/27/2024 09:40 AM   Modules accepted: Orders

## 2024-11-02 LAB — HM MAMMOGRAPHY

## 2024-11-03 ENCOUNTER — Encounter: Payer: Self-pay | Admitting: Family Medicine

## 2024-12-31 ENCOUNTER — Encounter (INDEPENDENT_AMBULATORY_CARE_PROVIDER_SITE_OTHER): Admitting: Ophthalmology

## 2025-04-27 ENCOUNTER — Ambulatory Visit: Payer: Self-pay | Admitting: Family Medicine

## 2025-07-08 ENCOUNTER — Encounter (INDEPENDENT_AMBULATORY_CARE_PROVIDER_SITE_OTHER): Admitting: Ophthalmology
